# Patient Record
Sex: Female | Born: 1955 | Race: White | Hispanic: No | Marital: Married | Smoking: Never smoker
Health system: Southern US, Community
[De-identification: ages and names within clinical notes are randomized; demographics above are authoritative.]

## PROBLEM LIST (undated history)

## (undated) DIAGNOSIS — I359 Nonrheumatic aortic valve disorder, unspecified: Secondary | ICD-10-CM

## (undated) DIAGNOSIS — E039 Hypothyroidism, unspecified: Secondary | ICD-10-CM

## (undated) DIAGNOSIS — G43909 Migraine, unspecified, not intractable, without status migrainosus: Secondary | ICD-10-CM

## (undated) DIAGNOSIS — E559 Vitamin D deficiency, unspecified: Secondary | ICD-10-CM

## (undated) DIAGNOSIS — Z85820 Personal history of malignant melanoma of skin: Secondary | ICD-10-CM

## (undated) DIAGNOSIS — M1990 Unspecified osteoarthritis, unspecified site: Secondary | ICD-10-CM

## (undated) DIAGNOSIS — Z9289 Personal history of other medical treatment: Secondary | ICD-10-CM

## (undated) DIAGNOSIS — Z932 Ileostomy status: Secondary | ICD-10-CM

## (undated) DIAGNOSIS — N840 Polyp of corpus uteri: Secondary | ICD-10-CM

## (undated) DIAGNOSIS — I351 Nonrheumatic aortic (valve) insufficiency: Secondary | ICD-10-CM

## (undated) DIAGNOSIS — K5010 Crohn's disease of large intestine without complications: Secondary | ICD-10-CM

## (undated) HISTORY — PX: LIPOMA EXCISION: SHX5283

## (undated) HISTORY — DX: Nonrheumatic aortic valve disorder, unspecified: I35.9

## (undated) HISTORY — PX: ABDOMINAL EXPLORATION SURGERY: SHX538

## (undated) HISTORY — PX: TRANSTHORACIC ECHOCARDIOGRAM: SHX275

## (undated) HISTORY — PX: TUBAL LIGATION: SHX77

## (undated) HISTORY — PX: BUNIONECTOMY: SHX129

---

## 1996-04-20 HISTORY — PX: BREAST BIOPSY: SHX20

## 1998-01-17 ENCOUNTER — Other Ambulatory Visit: Admission: RE | Admit: 1998-01-17 | Discharge: 1998-01-17 | Payer: Self-pay | Admitting: Obstetrics and Gynecology

## 1998-02-12 ENCOUNTER — Other Ambulatory Visit: Admission: RE | Admit: 1998-02-12 | Discharge: 1998-02-12 | Payer: Self-pay | Admitting: Obstetrics and Gynecology

## 1999-04-03 ENCOUNTER — Other Ambulatory Visit: Admission: RE | Admit: 1999-04-03 | Discharge: 1999-04-03 | Payer: Self-pay | Admitting: Obstetrics and Gynecology

## 1999-05-13 ENCOUNTER — Encounter: Payer: Self-pay | Admitting: Emergency Medicine

## 1999-05-13 ENCOUNTER — Inpatient Hospital Stay (HOSPITAL_COMMUNITY): Admission: EM | Admit: 1999-05-13 | Discharge: 1999-05-15 | Payer: Self-pay | Admitting: Emergency Medicine

## 1999-05-14 ENCOUNTER — Encounter: Payer: Self-pay | Admitting: Emergency Medicine

## 1999-05-15 ENCOUNTER — Encounter: Payer: Self-pay | Admitting: General Surgery

## 1999-06-03 ENCOUNTER — Encounter: Payer: Self-pay | Admitting: Obstetrics and Gynecology

## 1999-06-03 ENCOUNTER — Encounter: Admission: RE | Admit: 1999-06-03 | Discharge: 1999-06-03 | Payer: Self-pay | Admitting: Obstetrics and Gynecology

## 1999-07-03 ENCOUNTER — Ambulatory Visit (HOSPITAL_COMMUNITY): Admission: RE | Admit: 1999-07-03 | Discharge: 1999-07-03 | Payer: Self-pay | Admitting: Gastroenterology

## 1999-07-03 ENCOUNTER — Encounter (INDEPENDENT_AMBULATORY_CARE_PROVIDER_SITE_OTHER): Payer: Self-pay | Admitting: Specialist

## 1999-07-10 ENCOUNTER — Encounter: Payer: Self-pay | Admitting: Gastroenterology

## 1999-07-10 ENCOUNTER — Encounter: Admission: RE | Admit: 1999-07-10 | Discharge: 1999-07-10 | Payer: Self-pay | Admitting: Gastroenterology

## 2000-06-04 ENCOUNTER — Encounter: Payer: Self-pay | Admitting: Obstetrics and Gynecology

## 2000-06-04 ENCOUNTER — Encounter: Admission: RE | Admit: 2000-06-04 | Discharge: 2000-06-04 | Payer: Self-pay | Admitting: Obstetrics and Gynecology

## 2000-10-25 ENCOUNTER — Ambulatory Visit (HOSPITAL_COMMUNITY): Admission: RE | Admit: 2000-10-25 | Discharge: 2000-10-25 | Payer: Self-pay | Admitting: Specialist

## 2000-11-10 ENCOUNTER — Ambulatory Visit (HOSPITAL_COMMUNITY): Admission: RE | Admit: 2000-11-10 | Discharge: 2000-11-10 | Payer: Self-pay | Admitting: Specialist

## 2001-04-01 ENCOUNTER — Encounter: Admission: RE | Admit: 2001-04-01 | Discharge: 2001-04-01 | Payer: Self-pay | Admitting: Internal Medicine

## 2001-04-01 ENCOUNTER — Encounter: Payer: Self-pay | Admitting: Internal Medicine

## 2001-05-26 ENCOUNTER — Encounter (INDEPENDENT_AMBULATORY_CARE_PROVIDER_SITE_OTHER): Payer: Self-pay | Admitting: Specialist

## 2001-05-26 ENCOUNTER — Ambulatory Visit (HOSPITAL_COMMUNITY): Admission: RE | Admit: 2001-05-26 | Discharge: 2001-05-26 | Payer: Self-pay | Admitting: Gastroenterology

## 2001-06-09 ENCOUNTER — Encounter: Payer: Self-pay | Admitting: Obstetrics and Gynecology

## 2001-06-09 ENCOUNTER — Encounter: Admission: RE | Admit: 2001-06-09 | Discharge: 2001-06-09 | Payer: Self-pay | Admitting: Obstetrics and Gynecology

## 2001-06-14 ENCOUNTER — Encounter: Admission: RE | Admit: 2001-06-14 | Discharge: 2001-06-14 | Payer: Self-pay | Admitting: Obstetrics and Gynecology

## 2001-06-14 ENCOUNTER — Encounter: Payer: Self-pay | Admitting: Obstetrics and Gynecology

## 2001-08-25 ENCOUNTER — Other Ambulatory Visit: Admission: RE | Admit: 2001-08-25 | Discharge: 2001-08-25 | Payer: Self-pay | Admitting: Obstetrics and Gynecology

## 2002-04-21 ENCOUNTER — Inpatient Hospital Stay (HOSPITAL_COMMUNITY): Admission: EM | Admit: 2002-04-21 | Discharge: 2002-04-24 | Payer: Self-pay | Admitting: Emergency Medicine

## 2002-04-21 ENCOUNTER — Encounter: Payer: Self-pay | Admitting: Emergency Medicine

## 2002-04-24 ENCOUNTER — Encounter (INDEPENDENT_AMBULATORY_CARE_PROVIDER_SITE_OTHER): Payer: Self-pay | Admitting: *Deleted

## 2002-05-01 ENCOUNTER — Ambulatory Visit (HOSPITAL_COMMUNITY): Admission: RE | Admit: 2002-05-01 | Discharge: 2002-05-01 | Payer: Self-pay | Admitting: General Surgery

## 2002-05-01 ENCOUNTER — Encounter: Payer: Self-pay | Admitting: General Surgery

## 2002-06-15 ENCOUNTER — Encounter: Payer: Self-pay | Admitting: Obstetrics and Gynecology

## 2002-06-15 ENCOUNTER — Encounter: Admission: RE | Admit: 2002-06-15 | Discharge: 2002-06-15 | Payer: Self-pay | Admitting: Obstetrics and Gynecology

## 2002-09-25 ENCOUNTER — Other Ambulatory Visit: Admission: RE | Admit: 2002-09-25 | Discharge: 2002-09-25 | Payer: Self-pay | Admitting: Obstetrics and Gynecology

## 2002-10-02 ENCOUNTER — Encounter: Payer: Self-pay | Admitting: Obstetrics and Gynecology

## 2002-10-02 ENCOUNTER — Encounter: Admission: RE | Admit: 2002-10-02 | Discharge: 2002-10-02 | Payer: Self-pay | Admitting: Obstetrics and Gynecology

## 2002-11-10 ENCOUNTER — Ambulatory Visit (HOSPITAL_COMMUNITY): Admission: RE | Admit: 2002-11-10 | Discharge: 2002-11-10 | Payer: Self-pay | Admitting: Obstetrics and Gynecology

## 2002-11-10 ENCOUNTER — Encounter (INDEPENDENT_AMBULATORY_CARE_PROVIDER_SITE_OTHER): Payer: Self-pay | Admitting: *Deleted

## 2002-11-20 ENCOUNTER — Encounter (HOSPITAL_COMMUNITY): Admission: RE | Admit: 2002-11-20 | Discharge: 2003-02-18 | Payer: Self-pay | Admitting: General Surgery

## 2002-11-20 ENCOUNTER — Encounter: Payer: Self-pay | Admitting: General Surgery

## 2003-04-21 HISTORY — PX: MELANOMA EXCISION: SHX5266

## 2003-05-28 ENCOUNTER — Inpatient Hospital Stay (HOSPITAL_COMMUNITY): Admission: EM | Admit: 2003-05-28 | Discharge: 2003-05-29 | Payer: Self-pay | Admitting: Emergency Medicine

## 2003-06-29 ENCOUNTER — Encounter: Admission: RE | Admit: 2003-06-29 | Discharge: 2003-06-29 | Payer: Self-pay | Admitting: Obstetrics and Gynecology

## 2003-10-30 ENCOUNTER — Other Ambulatory Visit: Admission: RE | Admit: 2003-10-30 | Discharge: 2003-10-30 | Payer: Self-pay | Admitting: Obstetrics and Gynecology

## 2004-07-15 ENCOUNTER — Encounter: Admission: RE | Admit: 2004-07-15 | Discharge: 2004-07-15 | Payer: Self-pay | Admitting: Obstetrics and Gynecology

## 2004-11-28 ENCOUNTER — Other Ambulatory Visit: Admission: RE | Admit: 2004-11-28 | Discharge: 2004-11-28 | Payer: Self-pay | Admitting: Obstetrics and Gynecology

## 2005-07-28 ENCOUNTER — Encounter: Admission: RE | Admit: 2005-07-28 | Discharge: 2005-07-28 | Payer: Self-pay | Admitting: Obstetrics and Gynecology

## 2005-08-19 ENCOUNTER — Encounter: Admission: RE | Admit: 2005-08-19 | Discharge: 2005-08-19 | Payer: Self-pay | Admitting: Obstetrics and Gynecology

## 2006-08-02 ENCOUNTER — Encounter: Admission: RE | Admit: 2006-08-02 | Discharge: 2006-08-02 | Payer: Self-pay | Admitting: Obstetrics and Gynecology

## 2006-08-10 ENCOUNTER — Encounter: Admission: RE | Admit: 2006-08-10 | Discharge: 2006-08-10 | Payer: Self-pay | Admitting: Obstetrics and Gynecology

## 2006-09-15 ENCOUNTER — Inpatient Hospital Stay (HOSPITAL_COMMUNITY): Admission: EM | Admit: 2006-09-15 | Discharge: 2006-09-19 | Payer: Self-pay | Admitting: Emergency Medicine

## 2007-08-15 ENCOUNTER — Encounter: Admission: RE | Admit: 2007-08-15 | Discharge: 2007-08-15 | Payer: Self-pay | Admitting: Obstetrics and Gynecology

## 2008-11-01 ENCOUNTER — Encounter: Admission: RE | Admit: 2008-11-01 | Discharge: 2008-11-01 | Payer: Self-pay | Admitting: Obstetrics and Gynecology

## 2008-11-14 ENCOUNTER — Encounter: Admission: RE | Admit: 2008-11-14 | Discharge: 2008-11-14 | Payer: Self-pay | Admitting: Obstetrics and Gynecology

## 2009-10-23 ENCOUNTER — Ambulatory Visit (HOSPITAL_COMMUNITY): Admission: RE | Admit: 2009-10-23 | Discharge: 2009-10-23 | Payer: Self-pay | Admitting: Gynecology

## 2009-12-30 ENCOUNTER — Encounter: Admission: RE | Admit: 2009-12-30 | Discharge: 2009-12-30 | Payer: Self-pay | Admitting: Obstetrics and Gynecology

## 2010-01-01 ENCOUNTER — Encounter
Admission: RE | Admit: 2010-01-01 | Discharge: 2010-01-01 | Payer: Self-pay | Source: Home / Self Care | Admitting: Obstetrics and Gynecology

## 2010-02-20 ENCOUNTER — Ambulatory Visit: Payer: Self-pay | Admitting: Vascular Surgery

## 2010-02-27 ENCOUNTER — Ambulatory Visit: Payer: Self-pay | Admitting: Vascular Surgery

## 2010-04-23 LAB — CBC
HCT: 39.2 % (ref 36.0–46.0)
Hemoglobin: 13 g/dL (ref 12.0–15.0)
MCH: 29.1 pg (ref 26.0–34.0)
MCHC: 33.2 g/dL (ref 30.0–36.0)
MCV: 87.9 fL (ref 78.0–100.0)
Platelets: 222 10*3/uL (ref 150–400)
RBC: 4.46 MIL/uL (ref 3.87–5.11)
RDW: 13.8 % (ref 11.5–15.5)
WBC: 8.3 10*3/uL (ref 4.0–10.5)

## 2010-04-23 LAB — BASIC METABOLIC PANEL
BUN: 21 mg/dL (ref 6–23)
CO2: 27 mEq/L (ref 19–32)
Calcium: 9.6 mg/dL (ref 8.4–10.5)
Chloride: 104 mEq/L (ref 96–112)
Creatinine, Ser: 0.82 mg/dL (ref 0.4–1.2)
GFR calc Af Amer: >60 mL/min (ref >60)
GFR calc non Af Amer: >60 mL/min (ref >60)
Glucose, Bld: 68 mg/dL — ABNORMAL LOW (ref 70–99)
Potassium: 3.5 mEq/L (ref 3.5–5.1)
Sodium: 138 mEq/L (ref 135–145)

## 2010-04-24 ENCOUNTER — Ambulatory Visit (HOSPITAL_COMMUNITY)
Admission: RE | Admit: 2010-04-24 | Discharge: 2010-04-24 | Payer: Self-pay | Source: Home / Self Care | Attending: Surgery | Admitting: Surgery

## 2010-05-11 ENCOUNTER — Encounter: Payer: Self-pay | Admitting: Orthopedic Surgery

## 2010-05-12 ENCOUNTER — Encounter: Payer: Self-pay | Admitting: Obstetrics and Gynecology

## 2010-05-15 LAB — SURGICAL PCR SCREEN: MRSA, PCR: NEGATIVE

## 2010-07-06 LAB — PROTIME-INR: Prothrombin Time: 12.2 seconds (ref 11.6–15.2)

## 2010-07-06 LAB — CBC
MCH: 30.7 pg (ref 26.0–34.0)
MCHC: 34.2 g/dL (ref 30.0–36.0)
Platelets: 207 10*3/uL (ref 150–400)
RBC: 4.07 MIL/uL (ref 3.87–5.11)

## 2010-09-02 NOTE — Consult Note (Signed)
NAME:  Brittany Whitney, Brittany Whitney NO.:  [id number]   MEDICAL RECORD NO.:  [id number]          PATIENT TYPE:  INP   LOCATION:  8127                         FACILITY:  [facility]   PHYSICIAN:  Imogene Burn. Georgette Dover, M.D. DATE OF BIRTH:  1955-07-11   DATE OF CONSULTATION:  09/15/2006  DATE OF DISCHARGE:                                 CONSULTATION   REFERRING PHYSICIAN:  Lear Ng, MD   REASON FOR CONSULTATION:  Questionable stricture in ileostomy, possibly  requiring surgical revision.   HISTORY OF PRESENT ILLNESS:  Brittany Whitney is a 21-year female patient who  back in the 17s was diagnosed with ulcerative colitis.  She has had  prior total colectomy and ileostomy in 1988 at [facility].  She has had  multiple revisions.  She has had prior right lower quadrant ileostomy  which developed a fistula and had to be revised into a Brooke ileostomy  in the left lower quadrant in 1993 by Dr. Dalbert Batman.  Subsequently, it was  determined the patient did not have ulcerative colitis, but she had  Crohn disease.  She has not been on any medications for this for years  since she has been in remission and has been asymptomatic.  She had  recently been seen at [facility] by a gastroenterologist and surgeon  for consultation, but has not have any procedures done by this surgeon.  She has also had prior surgeries done in [place].   In regards to this admission, the patient relates abrupt onset Wednesday  of initially no output through her ileostomy for about 6-8 hours.  Subsequently, she developed bloating, nausea and vomiting and  significant pain.  She presented to the ER and apparently opened up;  symptoms seemed to resolve, but as a precaution, a CT with contrast was  ordered and after drinking oral contrast, the patient's symptoms  returned.  Now she is having intermittent diffuse abdominal pain,  cramping with increasing loud bowel sounds and a sensation when symptoms  clear that she has  emptied into her bag.  A CT scan, as noted, was done  and this demonstrated a distal small bowel proximal to the ostomy with  increased wall thickening as well as other small bowel wall thickening  at the terminal ileum; this extends to the ileostomy pouch.  They are  questioning a mild stricture.  Because of these findings and the  patient's symptoms, surgical consultation has been requested.   REVIEW OF SYSTEMS:  As above.  Essentially no fevers, no chills, no  cough, no chest pain, no tachypalpitations.  She has noted some bulging  in the right lower quadrant scar area of prior ileostomy and this has  become more tender over the past 6 months.   PAST MEDICAL HISTORY:  1. Crohn disease previously thought to be ulcerative colitis.  2. Recent MRSA abscess on buttocks.   PAST SURGICAL HISTORY:  1. Total colectomy with continent ileostomy in 1988 at [facility] with at      least 7 or 8 revision operations since then.  2. 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  3. C-sections.  4. Total hysterectomy.  5. A right breast biopsy for lipoma.  6. 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   ALLERGIES:  NO KNOWN DRUG ALLERGIES.   CURRENT MEDICATIONS IN THE HOSPITAL:  1. Ceftin.  2. Prednisone.  3. Various p.r.n. medications.   The patient and was on prednisone 5 mg daily, calcium and Ambien as  needed.   SOCIAL HISTORY:  No alcohol.  No tobacco.  She is married.   PHYSICAL EXAM:  GENERAL:  Pleasant female patient, currently complaining  of intermittent abdominal pain which can be quite severe in nature with  associated bloating.  VITAL SIGNS:  Temperature 98.4, BP 114/40, pulse 68, respirations 15.  NEUROLOGIC:  The patient is alert and oriented x3, moving all  extremities x4.  No focal deficits.  HEENT:  Head normocephalic.  Sclerae not injected.  NECK:  Supple.   No adenopathy.  CHEST:  Bilateral lung sounds are clear to auscultation.  Respiratory  effort is nonlabored.  CARDIAC:  S1 and S2; no rubs, murmurs, thrills or gallops.  IV fluids  are infusing.  ABDOMEN:  Soft and nondistended.  She is diffusely tender without  guarding or rebounding, more so over the right lower quadrant.  She also  has an old scar in this area where she had prior ileostomy revision.  There is slight wall defect with no hernia felt.  She has an ileostomy  that is pink with noted flatus and stool in the bag.  EXTREMITIES:  Symmetrical in appearance without edema, cyanosis or  clubbing.   LABORATORY DATA:  Potassium 3.1, sodium 141, BUN 9, creatinine 0.63.  White count today is 5100; 24 hours prior at admission, it was 11,100,  hemoglobin 9.9.   DIAGNOSTICS:  CT of the abdomen and pelvis as noted.   IMPRESSION:  1. Abdominal pain with CT findings concerning for recurrence of      Crohn's disease and strictures near the ileostomy site.  2. Right lower quadrant pain over old abdominal incisional, rule out      hernia.  3. Hypokalemia.  4. History of multiple surgical revision and resections secondary to      Crohn's disease.   PLAN:  1. We will go ahead and check a fistulogram today to rule out any      definite stricture that may be surgical in nature.  2. CT findings appear to be more consistent with acute Crohn's      exacerbation, defer that treatment to GI.  3. We will also review the actual CT films to determine if there is a      hernia at the right lower quadrant incision sites.      Walters Delphia Grates. Tsuei, M.D.  Electronically Signed    ALE/MEDQ  D:  09/17/2006  T:  09/17/2006  Job:  [id number]

## 2010-09-02 NOTE — H&P (Signed)
NAME:  Brittany Whitney, Brittany Whitney NO.:  [id number]   MEDICAL RECORD NO.:  [id number]          PATIENT TYPE:  INP   LOCATION:  1846                         FACILITY:  [facility]   PHYSICIAN:  Lear Ng, MDDATE OF BIRTH:  1956/04/18   DATE OF ADMISSION:  09/15/2006  DATE OF DISCHARGE:                              HISTORY & PHYSICAL   This is a 55 year old female with history of inflammatory bowel disease  thought in the past to be ulcerative colitis with a proctocolectomy with  ileostomy and has had multiple revisions, who presents to the ER today  in severe abdominal pain, nausea and vomiting.  She states that she has  had no ileostomy output since 9:00 p.m. on May 27.  She is negative for  any recent illnesses with the exception of recent oral surgery.  She is  currently on Ceftin.  She states that she had a small skin lesion on her  buttocks that was diagnosed as MRSA approximately two months ago, it was  treated by Dr. Wilhemina Bonito.  This is the fifth or sixth time she has  become obstructed.   PRIMARY GI DOCTOR:  Robert Buccini.   SURGEON:  Fanny Skates   PAST MEDICAL HISTORY:  1. Recent MRSA on buttocks.  2. Colectomy.  3. Recurrent SBO.  4. Ovarian cyst.  5. History of breast lump.  6. Dr. Herbie Baltimore Buccini performed an ileoscopy in 2004, results showed      inflamed ileum and stenosis approximately 15 cm from her stoma.   CURRENT MEDICATIONS:  Include:  1. Prednisone.  2. Calcium.  3. Ceftin.  4. Ambien.  5. Hydrocodone.  6. Acetaminophen.   ALLERGIES:  PENICILLIN AND CECLOR (RASH).   REVIEW OF SYSTEMS:  Significant for recent oral surgery on May 23.  Patient is currently on Ceftin.   FAMILY HISTORY:  Significant for heart disease and breast cancer.   SOCIAL HISTORY:  Occasional alcohol, negative for tobacco and drugs.  She lives with her husband.  She has two children.   PHYSICAL EXAMINATION:  GENERAL:  She is alert and oriented and resting  comfortably.  She is afraid to move because she is afraid that her pain  will begin again.  CARDIOVASCULAR SYSTEM:  Regular rate and rhythm.  LUNGS:  Clear.  ABDOMEN:  Soft, nondistended.  Her ostomy is empty.  She has positive  bowel sounds.   CURRENT LABS:  Hemoglobin 11.6, hematocrit 34.7, white count 11.1,  platelets 287,000.  Chem 7 shows a sodium of 138, potassium of 3.4,  chloride 105, bicarb 24, BUN 24, creatinine 0.63 and glucose 93.  UA was  negative.   On CT with Gastrografin contrast of the abdomen and pelvis:  1. Moderate amount of free fluid in the pelvis.  2. Thickening from [facility] pouch to stoma.  3. No obstruction.   ASSESSMENT:  Dr. Wilford Corner has seen and examined the patient and  collected a history.  He states this is a recurrent obstruction.  Patient has seen Dr. Kristie Cowman in gastroenterology at [facility] and  had surgical consultation  there several years ago.  She states that she  wants to leave her ostomy in the same spot, but pull her bowel through.  We will consult [facility], as she is a patient of Dr.  Fanny Skates, and admit the patient for observation and rehydration.  While we were examining the patient, she began to vomit.  As she was  vomiting, she said she felt something in her ostomy release and she had  a great deal of output into her ostomy bag so the patient felt great  relief.  She received Gastrografin contrast orally for a CT scan and  again had symptoms of obstruction, severe pain, started vomiting for two  hours until again she felt something release into her ostomy and she had  ostomy output.      Melton Alar, [place]      Lear Ng, MD  Electronically Signed    MLY/MEDQ  D:  09/15/2006  T:  09/15/2006  Job:  [id number]   cc:   Ronald Lobo, M.D.  Edsel Petrin. Dalbert Batman, M.D.

## 2010-09-05 NOTE — Discharge Summary (Signed)
NAME:  Brittany Whitney, Brittany Whitney NO.:  [id number]   MEDICAL RECORD NO.:  [id number]                   PATIENT TYPE:  INP   LOCATION:  5503                                 FACILITY:  [facility]   PHYSICIAN:  Edsel Petrin. Dalbert Batman, M.D.             DATE OF BIRTH:  Jun 08, 1955   DATE OF ADMISSION:  04/21/2002  DATE OF DISCHARGE:  04/24/2002                                 DISCHARGE SUMMARY   FINAL DIAGNOSES:  1. Abdominal pain and vomiting of uncertain etiology.  2. Inflammation and noncritical stenosis of distal ileum proximal to     ileostomy of uncertain significance.  3. Status post total colectomy with ileostomy for ulcerative colitis.   PROCEDURE PERFORMED:  Ileoscopy 04/24/02.   HISTORY:  The patient is a 55 year old white female with a significant past  medical history of ulcerative colitis leading to total colectomy and  ileostomy and multiple revisions in the past.  She had a history of small-  bowel obstruction in the past which has resolved spontaneously.  She also  has a history of ileitis documented by enteroscopy through the ileostomy  stoma.  She takes low-dose prednisone for this.   She presented to the emergency room with an 8- to 10-hour history of  moderately severe centralized lower abdominal cramping and decreased but not  absent ileostomy output.  She had one episode of vomiting after supper the  day of admission.  She vomited food only; no bilious vomiting.   For details of her past medical history, social history, family history,  please see detailed admission note.   PHYSICAL EXAMINATION:  GENERAL:  Pleasant healthy-appearing middle-aged  woman.  She was complaining of intermittent abdominal cramps.  She is very  stoic.  VITAL SIGNS:  Temperature 98.1, blood pressure 99/60, heart rate 103,  respirations 24.  EYES:  Sclerae are clear.  OROPHARYNX:  Clear.  NECK:  Supple, nontender; no thyromegaly, no bruit, no adenopathy.  HEART:  Regular  rate and rhythm; no murmurs.  LUNGS:  Clear to auscultation.  ABDOMEN:  Soft.  Hypoactive bowel sounds.  Tenderness with some guarding in  the right lower quadrant.  Well-healed scars.  No evidence of any ventral or  incisional hernias.  Well-healed ileostomy in the left lower quadrant; no  hernia around the ostomy and not very tender around the ostomy.  There is  some stool in the bag.   ADMISSION DATA:  Abdominal x-rays show a fairly gasless abdomen, it  certainly did not look clearly obstructed.  Hemoglobin 10.5, white blood  cell count 9800, complete metabolic panel was normal.  Urinalysis was  normal.   HOSPITAL COURSE:  The patient vomited in the emergency room and a  nasogastric tube was placed.  She felt better a few hours after that, stated  that the nausea and cramps were resolving.   Consultation was obtained with Dr. Herbie Baltimore Buccini's group.  Dr. Oletta Lamas saw  her and thought that this could possibly be an early partial small-bowel  obstruction but that she was improving and he agreed with the management  plan.  The patient felt better and began putting out fluid through her  ileostomy on 04/22/02 and her nasogastric tube was discontinued and she was  given ice chips and then subsequently clear liquids and did well with those.   On 04/24/02 she was doing much better, tolerating full liquid diet, had good  output from her ileostomy and her abdominal exam was fairly unremarkable.  She was concerned about whether she was going to have future episodes of  this and I was unclear about that.  She was placed on low-residue diet.  We  asked Dr. Cristina Gong to see her.  He did see her and performed ileoscopy on  04/24/02.  He states that there was some inflammatory changes of the last  few inches of the ileum just proximal to the ileostomy and there was a  stricture about 10-12 cm up inside of uncertain significance.  Since she was  asymptomatic nothing further was done.   Biopsies of  the distal ileum showed ulceration and granulation tissue  formation, this was consistent with Crohn's disease, but no granulomas were  identified.   The patient felt well and wanted to go home and she was discharged home  after the ileoscopy.  She was to follow up with Dr. Cristina Gong in the office in  2-4 weeks.                                               Edsel Petrin. Dalbert Batman, M.D.    HMI/MEDQ  D:  05/26/2002  T:  05/26/2002  Job:  [id number]   cc:   Nehemiah Settle, M.D.  [address]  [place] [address]  Fax: [phone]   Ronald Lobo, M.D.  [address]  [place], [place] [address]  Fax: [phone]

## 2010-09-05 NOTE — Discharge Summary (Signed)
NAME:  Brittany Whitney, Brittany Whitney NO.:  [id number]   MEDICAL RECORD NO.:  [id number]          PATIENT TYPE:  INP   LOCATION:  3009                         FACILITY:  [facility]   PHYSICIAN:  Lear Ng, MDDATE OF BIRTH:  [birth year]   DATE OF ADMISSION:  09/15/2006  DATE OF DISCHARGE:  09/19/2006                               DISCHARGE SUMMARY   DISCHARGE DIAGNOSES:  1. Ileal stricture.  2. History of inflammatory bowel disease.  3. Partial obstruction secondary to #1.   DISCHARGE MEDICATIONS:  1. Entocort 9 mg p.o. daily.  2. Calcium.  3. Ambien.  4. Hydrocodone as needed.  5. Tylenol as needed.   HISTORY OF PRESENT ILLNESS/HOSPITAL COURSE:  Ms. Dietzman presented on Sep 15, 2006, due to severe abdominal pain, nausea and vomiting with  decreased to no ileostomy output.  She is status post proctocolectomy  and ileostomy and multiple revisions in the past with previous diagnosis  of what was thought to be ulcerative colitis.  She has had a history of  a ileal stricture was stenosis dating back to the last several years and  her current presentation was concerning for recurrence of that.  She had  abdominal pelvic CT scan done in the ER which showed mild distension of  ileal loops, but no obstruction or focal abscess.  Due to intermittent  waves of severe abdominal pain as well as decreased ileostomy output,  she was admitted for supportive with fluids and bowel rest.  A sinus  fistula tract study was done which showed a long, smooth-strictured  appearing terminal ileum and functional partial obstruction.  Surgical  consult was obtained during her hospitalization and they recommended  supportive care and there was no indication for ileostomy revision at  this time.  He was placed on a short course of prednisone and responded  to that and was able to tolerate a regular diet prior to discharge.  Prednisone was discontinued prior to discharge and she was switched to  budesonide 9 mg p.o. daily.  She had previously been evaluated at [facility] regarding possible revision of her ileostomy versus further  surgery due to recurrent ileal stenosis, but she had decided to hold off  on any further surgery several years ago.   FOLLOWUP:  The patient was set up to follow up with Dr. Cristina Gong at [facility] within a week of discharge.   CONDITION ON DISCHARGE:  Improved.   DIET:  Low-residue diet.   ACTIVITY:  No restrictions.      Lear Ng, MD  Electronically Signed     VCS/MEDQ  D:  10/14/2006  T:  10/15/2006  Job:  [id number]   cc:   Ronald Lobo, M.D.  Imogene Burn. Tsuei, M.D.  Edsel Petrin. Dalbert Batman, M.D.

## 2010-09-05 NOTE — H&P (Signed)
NAME:  Brittany Whitney, Brittany Whitney NO.:  [id number]   MEDICAL RECORD NO.:  [id number]                   PATIENT TYPE:  AMB   LOCATION:  SDC                                  FACILITY:  [facility]   PHYSICIAN:  Darlyn Chamber, M.D.                DATE OF BIRTH:  [birth year]   DATE OF ADMISSION:  11/10/2002  DATE OF DISCHARGE:                                HISTORY & PHYSICAL   HISTORY OF PRESENT ILLNESS:  The patient is a 55 year old gravida 32, para 2,  abortus 1 married white female who presents for hysteroscopy. In terms of  the present admission, the patient has been on hormone replacement including  Progesterone vaginal cream. She was experiencing some abnormal bleeding.  Subsequent saline infusion ultrasound revealed an endometrial polyp, for  which she presents for hysteroscopic evaluation and resection.   ALLERGIES:  PENICILLIN.   MEDICATIONS:  Include Prednisone 5 mg every other day, calcium and a  multivitamin.   PAST MEDICAL HISTORY:  Significant in that the patient has history of  significant ulcerative colitis. Because of this, she underwent a  proctocolectomy. She does have a Kock pouch in place. She has had numerous  revisions of this. Presently, she does not have any significant problems but  remains on Prednisone. She was previously hospitalized for small bowel  obstruction that did resolve. Otherwise, she has had usual childhood  diseases without any significant sequelae.   PAST SURGICAL HISTORY:  Includes numerous hospitalizations and surgeries for  the ulcerative colitis. She has had, from a gynecologic perspective, a  laparoscopy with lysis of adhesion by Dr. Denton Meek at [facility] in 1991.   OBSTETRICAL HISTORY:  She has had 1 spontaneous abortion and 2 cesarean  sections. She has had a previous tubal with her last cesarean section.   FAMILY HISTORY:  Noncontributory.   SOCIAL HISTORY:  No tobacco or alcohol use.   REVIEW OF SYSTEMS:   Noncontributory.   PHYSICAL EXAMINATION:  VITAL SIGNS: Afebrile with stable vital signs.  HEENT: Normocephalic. Pupils are equal, round, and reactive to light and  accommodation. Extraocular movements were intact. Sclera and conjunctiva  clear. Oropharynx clear.  NECK: Without thyromegaly.  BREAST: No discrete masses at the present time. Does have some increased  density in the upper outer quadrant of the left breast that is presently  under evaluation.  LUNGS: Clear.  CARDIAC: Regular rate. No murmurs or gallops.  ABDOMEN: Midline incision intact. Does have a Kock pouch with ostomy device  on the left. There are no palpable abnormalities.  PELVIC: Vaginal mucosa is clear. Cervix unremarkable. Uterus normal size,  shape and contour. Adnexa free of mass or tenderness.    IMPRESSION:  1. Abnormal uterine bleeding with endometrial polyp.  2. History of ulcerative colitis with numerous operative procedures.   PAST FAMILY PSYCHIATRIC HISTORY:  At the present time, will undergo  hysteroscopy  resection of polyp. The risks of surgery have been discussed  including the risk of anesthetics. Risk of infection. Risk of vascular  injury that could require transfusion with the risk of AIDS or hepatitis.  Significant hemorrhage could require hysterectomy. There is the risk of  perforation with injury to adjacent organs that could require laparoscopy or  possible exploratory laparotomy. Risk of deep vein thrombosis and pulmonary  embolus. The patient expresses understanding of the indications and risks.                                               Darlyn Chamber, M.D.    JSM/MEDQ  D:  11/10/2002  T:  11/10/2002  Job:  [id number]

## 2010-09-05 NOTE — Op Note (Signed)
NAME:  Brittany Whitney, Brittany Whitney NO.:  [id number]   MEDICAL RECORD NO.:  [id number]                   PATIENT TYPE:  INP   LOCATION:  5503                                 FACILITY:  [facility]   PHYSICIAN:  Ronald Lobo, M.D.                DATE OF BIRTH:  Jun 08, 1955   DATE OF PROCEDURE:  04/24/2002  DATE OF DISCHARGE:                                 OPERATIVE REPORT   PROCEDURE PERFORMED:  Ileoscopy through stoma, with biopsies.   INDICATIONS FOR PROCEDURE:  A 55 year old female with long standing history  of inflammatory bowel disease thought to be ulcerative colitis, going back  to age 5.  She has had approximately 10 or 15 operations, culminating in a  Brooke ileostomy which is her current anatomy.  She was admitted to the  hospital several days ago with a small bowel obstruction picture (not  radiographically apparent, but responding to conservative therapy and bowel  rest).  Previous ileoscopy, most recently about a year ago, showed some  inflammatory changes in the ileum, nothing impressive.  The purpose of this  procedure is to assess her current mucosal status and look for evidence of  stricturing stenosis or obstruction.   FINDINGS:  Stenosis at approximately 15 cm from the stoma.  Distal ileal  mucosal inflammatory changes.   DESCRIPTION OF PROCEDURE:  The nature, purpose and risks of the procedure  were familiar to the patient.  She wished for this one to be done under  sedation, so Versed 9 mg IV was administered prior to and during the course  of the procedure without arrhythmias or desaturation.  The Olympus video  upper endoscope was used as an enteroscope and was advanced following  digital exploration of her ileostomy stoma, which showed the orifice to be  somewhat tight but would admit approximately one third of my little finger.   The scope was inserted and advanced to about 15 cm, where upon there was  what appeared to be an end-to-side  anastomosis to an ileal pouch.  This area  was somewhat stenotic and had superficial ulceration, exudate and  friability.  The scope was able to be popped through that area without undue  difficulty and was then advanced about an additional 10 cm or so up a normal  appearing ileum. Pull back was then performed.   It appeared that there was superficial exudate and superficial ulceration in  the distal ileum even distal to the stenotic area at 15 cm.  This area was  biopsied.   The patient tolerated this procedure well and there were no apparent  complications.   IMPRESSION:  Inflammatory changes of uncertain significance involving the  distal portion of the ileum as described above.    PLAN:  1. Await pathology.  2. Small bowel series to look for evidence of inflammatory changes which     might lead to obstruction  in the more proximal sections of her small     bowel.                                               Ronald Lobo, M.D.    RB/MEDQ  D:  04/24/2002  T:  04/24/2002  Job:  [id number]   cc:   Edsel Petrin. Dalbert Batman, M.D.  [address], [place]  [place] [address]  Fax: [phone]   Nehemiah Settle, M.D.  [address]  [place] [address]  Fax: [phone]

## 2010-09-05 NOTE — H&P (Signed)
NAME:  Brittany Whitney, Brittany Whitney NO.:  [id number]   MEDICAL RECORD NO.:  [id number]                   PATIENT TYPE:  INP   LOCATION:  5735                                 FACILITY:  [facility]   PHYSICIAN:  Jeryl Columbia, M.D.                 DATE OF BIRTH:  [birth year]   DATE OF ADMISSION:  05/28/2003  DATE OF DISCHARGE:                                HISTORY & PHYSICAL   HISTORY:  The patient is well-known to my partner, Brittany Whitney, with a  history of ulcerative colitis, total colectomy and ileostomy with multiple  revisions, who has had a periodic small bowel obstruction in the past.  She  called me last night in the middle of the night saying that she had not  seeing anything in her ostomy bag for about five hours and had increased  abdominal pain.  We discussed how if it continued, particularly if nausea,  vomiting or fever occurred, she needed to come to the ER.  She began  vomiting and came to the ER where she was found to have a recurrent small  bowel obstruction, and was admitted for further workup and plan.  She had  had a recent workup at [facility] by Dr. 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   PAST MEDICAL HISTORY:  Pertinent for the ulcerative colitis and multiple  surgeries as above.  She has also had a breast biopsy for a lipoma.  She has  an ovarian cyst, followed by Dr. Radene Knee, and also has some chronic  headaches.   CURRENT MEDICATIONS:  1. Prednisone 5 every other day.  2. Ambien.  3. Imitrex quinine.  4. Prometrium.  5. Testosterone cream.  6. Velban.  7. She does use some over-the-counter vitamin supplements.  8. She does not use any other over-the-counter medicines other than on her     extensive list.   SOCIAL HISTORY:  She does drink socially, but does not smoke.   ALLERGIES:  1. PENICILLIN.  2. FLAGYL.   FAMILY HISTORY:  Negative for any sick contacts  or any obvious other GI  problems.   REVIEW OF SYSTEMS:  This came on acutely.  She had not been sick.  No  urinary complaints.  Possibly some raw carrots which may have done it.  No  other significant allergies to foods, travels, etc.   PHYSICAL EXAMINATION:  GENERAL:  In no acute distress, lying comfortably in  the bed.  VITAL SIGNS:  Stable.  Afebrile.  LUNGS:  Clear.  HEART:  Regular rate and rhythm.  ABDOMEN:  Soft.  There is some lower discomfort without rebound.  There is a  rare bowel sound.  EXTREMITIES:  No pedal edema.  Adequate peripheral pulses.   LABORATORY DATA:  X-rays reviewed.  Compatible with a probable small-bowel  obstruction.  See chart for labs.  No obvious significant abnormalities according to the nurse and Dr. Zenia Resides.   ASSESSMENT:  1. Small bowel obstruction.  The patient with ulcerative colitis, ileostomy     with multiple revisions.  2. Ovarian cyst.  3. Migraines.   PLAN:  Consider surgical consultation, possibly call Dr. ___________ in  [place].  For now, ice chips only.  Keep her comfortable with Buprenex  and Phenergan p.r.n. which she seems to tolerate.  Consider NG tube if  needed.  We will give a gentle enema via ostomy just to see if that helps.  Observation.                                                Jeryl Columbia, M.D.    MEM/MEDQ  D:  05/28/2003  T:  05/28/2003  Job:  [id number]   cc:   Brittany Whitney, M.D.  [address] [place]  [place], [place] [address]  Fax: [place]  [place]  [place] [address]  Fax: [phone]   Edsel Petrin. Dalbert Batman, M.D.  [address], [place]  [place] [address]  Fax: [phone]   Nehemiah Settle, M.D.  [address]  [place] [address]  Fax: [phone]   Kristie Cowman

## 2010-09-05 NOTE — Consult Note (Signed)
NAME:  Brittany Whitney, GASSER NO.:  [id number]   MEDICAL RECORD NO.:  [id number]                   PATIENT TYPE:  INP   LOCATION:  5511                                 FACILITY:  [facility]   PHYSICIAN:  James L. Rolla Flatten., M.D.          DATE OF BIRTH:  Jun 08, 1955   DATE OF CONSULTATION:  04/21/2002  DATE OF DISCHARGE:                                   CONSULTATION   REASON FOR CONSULTATION:  Abdominal pain, vomiting.   HISTORY:  A very delightful 55 year old who has known ulcerative colitis.  She has undergone previous total colectomy and ileostomy and actually had  several procedures for this; the first done at [facility] with a Brook ileostomy  with apparently an attempt at a continent ileostomy that failed and she  ended up having to have another one reestablished in the left lower  quadrant.  She had a recent ileoscopy through the stoma with biopsies back  in 2003 which did not reveal any obvious inflammatory bowel disease but just  some mild ileitis.  She has done reasonably well on low-dose prednisone.  She just returned from a skiing holiday out to [place].  When she came back,  she began to feel sick with abdominal pain and cramps and vomiting.  She is  quite careful with a low-residue diet, etc.  She came to the emergency room  with this, had an NG tube placed, and felt much better.  Her films were not  entirely consistent with small bowel obstruction.  She has had a previous  history of small bowel obstructions that have resolved clinically with  conservative measures.   MEDICATIONS ON ADMISSION:  1. Prednisone 5 mg q.o.d.  2. Magnesium.  3. Calcium.  4. Estrogen patch.  5. Quinine for leg cramps.   ALLERGIES:  PENICILLIN.   PAST MEDICAL HISTORY:  Multiple procedures including total colectomy for  ulcerative colitis with two ileostomies, two C-sections, cataract surgery.   FAMILY HISTORY:  Noncontributory.   PHYSICAL EXAMINATION:  VITAL SIGNS:  The  patient is afebrile.  Vital signs  normal.  GENERAL:  Pleasant white female in no acute distress.  HEENT:  Eyes clear, nonicteric.  NG tube is in place.  HEART:  Regular rhythm without murmurs or gallops.  LUNGS:  Clear.  ABDOMEN:  Surgical scars.  An ileostomy in place in the left lower quadrant  with a small amount of liquid.  Bowel sounds were present.  The abdomen was  not particularly distended.   ASSESSMENT:  Probable early partial small bowel obstruction.  It clearly  seems to be better with nasogastric suction.  I do not feel that at this  point she urgently needs an operation.   PLAN:  Would continue on therapy with nasogastric suction.  We will go ahead  and give intravenous Solu-Medrol.  We will follow with you.  James L. Rolla Flatten., M.D.    Jaynie Bream  D:  04/21/2002  T:  04/21/2002  Job:  [id number]   cc:   Ronald Lobo, M.D.  [address] [place]  [place], [place] [address]  Fax: [phone]   Edsel Petrin. Dalbert Batman, M.D.  [address] [place]  [place] [address]  Fax: [phone]

## 2010-09-05 NOTE — Procedures (Signed)
[facility]  Patient:    Brittany Whitney, Brittany Whitney                        MRN: [id number] Proc. Date: 07/03/99 Adm. Date:  [id number] Attending:  Ernie Avena CC:         Alysia Penna. Maxwell Caul, M.D.             Edsel Petrin. Dalbert Batman, M.D.                           Procedure Report  PROCEDURE PERFORMED:  Ileoscopy with biopsies.  ENDOSCOPIST:  Cleotis Nipper, M.D.  INDICATIONS FOR PROCEDURE:  The patient is a 55 year old female with very longstanding inflammatory bowel disease felt to have been ulcerative colitis in the past, and in fact, she underwent a total proctocolectomy with ileostomy, initially a Kock pouch but ultimately a Brook end ileostomy which is what she currently has. Recently she presented with a small bowel obstruction and retrograde barium contrast radiography of the distal small bowel raised the issue of stenosis and  irregularity of mucosa.  FINDINGS:  Ulcerations suggestive of Crohns ileitis.  DESCRIPTION OF PROCEDURE:  The nature, purpose and risks of the procedure had been discussed with the patient, who provided written consent and came as an outpatient, unprepped.  No sedation was needed.  The Olympus small caliber adult video endoscope was used as an enteroscope and was passed through the stoma which had a normal appearance and an unremarkable partial digital exam.  The stoma was fairly tight, so my index finger could not be completely inserted. The scope was advanced to about 15 cm, whereupon there was a branch point with  small blind pouch measuring a few centimeters in length, which had a small hole at the end of it, probably some post surgical puckering of the mucosa or a pseudodiverticulum.  However, if the scope was negotiated past a couple of folds, I was able to slip up into the main portion of the terminal ileum which had a completely normal mucosal appearance.  The main finding on this exam, however, was  the presence of multiple irregular ulcerations suggestive of Crohns disease in the very distal 10 to 15 cm of the ileum, basically distal to the above-mentioned branch point.  These ulcers varied in size from just 5 mm or so to perhaps 3 or 4 cm x 2 or 3 cm.  Some of the ulcers were quite deep.  They were irregular and somewhat serpiginous.  The intervening mucosa was, for the most part, normal in appearance with perhaps just a little it of friability.  Multiple biopsies were obtained from the ulcerated areas.  No polyps or masses ere observed.  The patient tolerated the procedure well and there were no apparent complications.  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  The patient has not recently been on NSAIDs which might be another cause for scattered distal small bowel ulcerations.  PLAN: 1. Await pathology on the biopsies. 2. Obtain small bowel series. 3. Initiate Pentasa 1 gm q.i.d. 4. Discuss with surgeon and primary physician. DD:  07/03/99 TD:  07/03/99 Job: [id number]

## 2010-09-05 NOTE — Procedures (Signed)
[facility]  Patient:    Brittany Whitney, Brittany Whitney Visit Number: [id number] MRN: [id number]          Service Type: END Location: ENDO Attending Physician:  Ernie Avena Dictated by:   Cleotis Nipper, M.D. Proc. Date: 05/26/01 Admit Date:  05/26/2001   CC:         Nehemiah Settle, M.D.                           Procedure Report  PROCEDURE:  Ileoscopy through stoma, with biopsies.  INDICATIONS:  This is a 55 year old who is many years status post a total colectomy for presumed ulcerative colitis, status post finally a Brooke ileostomy in 1993, maintained on low dose prednisone. Ileoscopy about two years ago showed some ulcerations in the terminal ileum raising the question that she may actually have smoldering inflammatory bowel disease. More recently, the patient has had some low grade obstructive symptoms and symptoms of smoldering inflammation such as myalgias and malaise, although these have gotten better following a brief course of Cipro.  FINDINGS:  Numerous superficial erosions and one or two deep ulcerations in the distal neo-terminal ileum.  DESCRIPTION OF PROCEDURE:   The nature, purposed, and risks of the procedure were familiar to the patient from prior examination. She provided written consent. No sedation was administered. The Olympus pediatric video colonoscope was able to be advanced after digital inspection of the stoma showed it be slightly stenotic at the orifice but it allowed most of my little finger to be inserted, and the pediatric colonoscope was readily inserted. It was advanced to about 15 or 20 cm where upon the patient began to experience discomfort so pullback was initiated.  Up the limit of the exam, the majority of the mucosa was normal, but there were some scattered, patchy postage-stamp size areas of white exudate with some associated friability, and more distally, there were actually some fairly deep irregular  ulcerations, again postage-stamp size. Multiple biopsies were obtained. The patient tolerated the procedure well and there were no apparent complications.  IMPRESSION:  Ileitis, possibly due to inflammatory bowel disease, but relatively asymptomatic at this time.  PLAN: Await pathology. Consider institution of a mesalamine preparation. Dictated by:   Cleotis Nipper, M.D. Attending Physician:  Ernie Avena DD:  05/23/01 TD:  05/26/01 Job: [id number]

## 2010-09-05 NOTE — H&P (Signed)
NAME:  Brittany Whitney, Brittany Whitney NO.:  [id number]   MEDICAL RECORD NO.:  [id number]                   PATIENT TYPE:  INP   LOCATION:  1830                                 FACILITY:  [facility]   PHYSICIAN:  Edsel Petrin. Dalbert Batman, M.D.             DATE OF BIRTH:  27-Jul-1955   DATE OF ADMISSION:  04/21/2002  DATE OF DISCHARGE:                                HISTORY & PHYSICAL   CHIEF COMPLAINT:  Abdominal pain and vomiting.   HISTORY OF PRESENT ILLNESS:  This is a 55 year old white female with a  significant past medical history of ulcerative colitis leading to total  colectomy and ileostomy and multiple revisions.  She has a history of small-  bowel obstruction in the past which has resolved spontaneously.  She also  has a history of ileitis documented by enteroscopy through the ileostomy and  she takes low-dose prednisone.   She now presents with an 8- to 10-hour history of moderately severe  centralized lower abdominal cramps and decreased but not absent ileostomy  output.  She had one episode of vomiting after supper; she vomited food  only, no bilious vomiting.  She is admitted for further evaluation and  management.   PAST HISTORY:  1. Total colectomy with continent ileostomy in 1988 at [facility]  She     had seven or eight operations for multiple revisions after that and the     continent ileostomy never worked well.  She subsequently had a diverting     Brooke ileostomy in the left lower quadrant.  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  2. She has had two pregnancies and two deliveries, both by C-section.  3. She has had a right breast biopsy for benign disease.  4. She has had cataract surgery thought to be due to steroid induced.   CURRENT MEDICATIONS:  1. Prednisone 5 mg every other day.  2. Magnesium.  3. Calcium supplements.  4. Estrogen patch.  5. She does take quinine every other day for leg cramps.   DRUG ALLERGIES:  PENICILLIN.   REVIEW OF SYSTEMS:  All systems are reviewed; they are noncontributory  except as described above.   PHYSICAL EXAMINATION:  GENERAL:  Very pleasant, healthy-appearing middle-  aged woman.  She has intermittent abdominal cramps with moderate pain.  She  is stoic.  VITAL SIGNS:  Temperature 98.1, blood pressure 99/60, heart rate 103,  respiratory rate 24.  HEENT:  Sclerae clear.  Extraocular movements intact.  Oropharynx without  lesions.  Somewhat dry mucous membranes.  NECK:  Neck supple, nontender.  No mass.  No jugular venous distention.  HEART:  Regular rate and rhythm.  No murmur.  LUNGS:  Lungs clear to auscultation.  ABDOMEN:  Abdomen is soft with hypoactive bowel sounds, but she  is tender  with some guarding in the right lower quadrant.  Well-healed scars.  I do  not detect any ventral or incisional herniae.  She has a well-budded  ileostomy in the left lower quadrant.  There is no hernia around the ostomy  and she is really not very tender around the ostomy.  EXTREMITIES:  No edema and good pulses.  NEUROLOGIC:  Neurologic grossly within normal limits.   ADMISSION DATA:  Lab work is pending.   Three-way abdominal films shows basically a gasless abdomen.  There is no  obstructive gas pattern.   The chest x-ray is normal.   IMPRESSION:  1. Abdominal pain and vomiting.  Question whether this is an early small-     bowel obstruction that is just not radiographically apparent.  Question     whether she may have ileitis with stricture somewhere proximal to the     ileostomy.  Question whether there is any other inflammatory process.  2. Status post total colectomy with ileostomy for ulcerative colitis.   PLAN:  1. The patient will be admitted for IV fluid rehydration and bowel rest.  2. We will insert an NG tube if her cramps or vomiting persist.  3. We will consider CT scanning if  the right lower quadrant tenderness     persists.  4. GI consult will be obtained with Dr. Herbie Baltimore Buccini's group.  5. We will check lab work now.  6. She will be given steroids IV.   I advised the patient and her husband that this may require further testing,  but there was no plan for surgery at this time.  Since the diagnosis is not  entirely clear, I would not predict the hospital course.                                               Edsel Petrin. Dalbert Batman, M.D.    HMI/MEDQ  D:  04/21/2002  T:  04/21/2002  Job:  [id number]   cc:   Nehemiah Settle, M.D.  [address]  [place] [address]  Fax: [phone]   Ronald Lobo, M.D.  [address] [place]  [place], [place] [address]  Fax: [phone]

## 2010-09-05 NOTE — Op Note (Signed)
   NAME:  Brittany Whitney, Brittany Whitney NO.:  [id number]   MEDICAL RECORD NO.:  [id number]                   PATIENT TYPE:  AMB   LOCATION:  SDC                                  FACILITY:  [facility]   PHYSICIAN:  Darlyn Chamber, M.D.                DATE OF BIRTH:  1955-12-18   DATE OF PROCEDURE:  11/10/2002  DATE OF DISCHARGE:                                 OPERATIVE REPORT   PREOPERATIVE DIAGNOSES:  1. Abnormal uterine bleeding.  2. Endometrial polyp.   POSTOPERATIVE DIAGNOSES:  1. Abnormal uterine bleeding.  2. Endometrial polyp.   OPERATIVE PROCEDURES:  1. Cervical dilation  2. Hysteroscopy.  3. Resection of endometrium.  4. Endometrial curettings.   SURGEON:  Darlyn Chamber, M.D.   ANESTHESIA:  General.   ESTIMATED BLOOD LOSS:  Minimal.   PACKS AND DRAINS:  None.   INTRAOPERATIVE BLOOD REPLACED:  None.   COMPLICATIONS:  None.   INDICATIONS:  Dictated in the history and physical.   DESCRIPTION OF PROCEDURE:  The patient was taken in the OR and placed in the  supine position.  After satisfactory level of general anesthesia obtained,  the patient was placed in the dorsal lithotomy position using the Allen  stirrups.  The perineum and vagina were prepped out with Betadine and draped  in a sterile field.  A speculum was placed in the vaginal vault.  The cervix  was grasped with a single-tooth tenaculum.  The uterus was posterior,  sounded approximately 8 cm.  The cervix was serially dilated to a size 35  Pratt dilator.  The operative hysteroscope was then introduced.  The  intrauterine cavity revealed some slightly thickened endometrium  posteriorly.  This may have been the polyp.  This was resected along with  multiple endometrial biopsies.  Endometrial curettings were  obtained.  At this point in time the speculum and the single-tooth tenaculum  were removed.  The patient was taken out of the dorsal lithotomy position,  once alert and extubated  transferred to the recovery room in good condition.  Sponge, needle, and instrument count was reported as correct by the  circulating nurse.                                                Darlyn Chamber, M.D.    JSM/MEDQ  D:  11/10/2002  T:  11/11/2002  Job:  [id number]

## 2010-10-21 ENCOUNTER — Other Ambulatory Visit: Payer: Self-pay | Admitting: Gastroenterology

## 2010-12-02 ENCOUNTER — Other Ambulatory Visit: Payer: Self-pay | Admitting: Gynecology

## 2010-12-02 DIAGNOSIS — N83209 Unspecified ovarian cyst, unspecified side: Secondary | ICD-10-CM

## 2010-12-04 ENCOUNTER — Ambulatory Visit (HOSPITAL_COMMUNITY): Payer: BC Managed Care – PPO

## 2010-12-04 ENCOUNTER — Other Ambulatory Visit: Payer: Self-pay | Admitting: Gynecology

## 2010-12-04 ENCOUNTER — Ambulatory Visit (HOSPITAL_COMMUNITY)
Admission: RE | Admit: 2010-12-04 | Discharge: 2010-12-04 | Disposition: A | Discharge status: Home / Self Care | Payer: BC Managed Care – PPO | Source: Ambulatory Visit | Attending: Gynecology | Admitting: Gynecology

## 2010-12-04 DIAGNOSIS — N83209 Unspecified ovarian cyst, unspecified side: Secondary | ICD-10-CM

## 2010-12-04 LAB — CBC
MCH: 29.4 pg (ref 26.0–34.0)
MCV: 87.8 fL (ref 78.0–100.0)
Platelets: 238 10*3/uL (ref 150–400)
RDW: 13.9 % (ref 11.5–15.5)

## 2011-02-04 ENCOUNTER — Other Ambulatory Visit: Payer: Self-pay | Admitting: Obstetrics and Gynecology

## 2011-02-04 DIAGNOSIS — Z1231 Encounter for screening mammogram for malignant neoplasm of breast: Secondary | ICD-10-CM

## 2011-02-20 ENCOUNTER — Ambulatory Visit: Payer: BC Managed Care – PPO

## 2011-02-23 ENCOUNTER — Ambulatory Visit
Admission: RE | Admit: 2011-02-23 | Discharge: 2011-02-23 | Disposition: A | Discharge status: Home / Self Care | Payer: BC Managed Care – PPO | Source: Ambulatory Visit | Attending: Obstetrics and Gynecology | Admitting: Obstetrics and Gynecology

## 2011-02-23 DIAGNOSIS — Z1231 Encounter for screening mammogram for malignant neoplasm of breast: Secondary | ICD-10-CM

## 2011-02-27 ENCOUNTER — Other Ambulatory Visit: Payer: Self-pay | Admitting: Obstetrics and Gynecology

## 2011-02-27 DIAGNOSIS — R928 Other abnormal and inconclusive findings on diagnostic imaging of breast: Secondary | ICD-10-CM

## 2011-03-03 ENCOUNTER — Ambulatory Visit
Admission: RE | Admit: 2011-03-03 | Discharge: 2011-03-03 | Disposition: A | Discharge status: Home / Self Care | Payer: BC Managed Care – PPO | Source: Ambulatory Visit | Attending: Obstetrics and Gynecology | Admitting: Obstetrics and Gynecology

## 2011-03-03 ENCOUNTER — Other Ambulatory Visit: Payer: Self-pay | Admitting: Obstetrics and Gynecology

## 2011-03-03 DIAGNOSIS — R928 Other abnormal and inconclusive findings on diagnostic imaging of breast: Secondary | ICD-10-CM

## 2011-04-28 ENCOUNTER — Other Ambulatory Visit: Payer: Self-pay | Admitting: Obstetrics and Gynecology

## 2011-04-28 DIAGNOSIS — N63 Unspecified lump in unspecified breast: Secondary | ICD-10-CM

## 2011-05-05 ENCOUNTER — Ambulatory Visit
Admission: RE | Admit: 2011-05-05 | Discharge: 2011-05-05 | Disposition: A | Discharge status: Home / Self Care | Payer: BC Managed Care – PPO | Source: Ambulatory Visit | Attending: Obstetrics and Gynecology | Admitting: Obstetrics and Gynecology

## 2011-05-05 DIAGNOSIS — N63 Unspecified lump in unspecified breast: Secondary | ICD-10-CM

## 2011-12-08 ENCOUNTER — Other Ambulatory Visit: Payer: Self-pay | Admitting: Gynecology

## 2011-12-08 DIAGNOSIS — IMO0002 Reserved for concepts with insufficient information to code with codable children: Secondary | ICD-10-CM

## 2011-12-09 ENCOUNTER — Encounter (HOSPITAL_COMMUNITY): Payer: Self-pay | Admitting: Pharmacy Technician

## 2011-12-09 ENCOUNTER — Other Ambulatory Visit: Payer: Self-pay | Admitting: Radiology

## 2011-12-10 ENCOUNTER — Encounter (HOSPITAL_COMMUNITY): Payer: Self-pay

## 2011-12-10 ENCOUNTER — Ambulatory Visit (HOSPITAL_COMMUNITY)
Admission: RE | Admit: 2011-12-10 | Discharge: 2011-12-10 | Disposition: A | Discharge status: Home / Self Care | Payer: BC Managed Care – PPO | Source: Ambulatory Visit | Attending: Gynecology | Admitting: Gynecology

## 2011-12-10 DIAGNOSIS — N83209 Unspecified ovarian cyst, unspecified side: Secondary | ICD-10-CM | POA: Insufficient documentation

## 2011-12-10 DIAGNOSIS — N9489 Other specified conditions associated with female genital organs and menstrual cycle: Secondary | ICD-10-CM | POA: Insufficient documentation

## 2011-12-10 DIAGNOSIS — IMO0002 Reserved for concepts with insufficient information to code with codable children: Secondary | ICD-10-CM

## 2011-12-10 HISTORY — DX: Vitamin D deficiency, unspecified: E55.9

## 2011-12-10 LAB — CBC WITH DIFFERENTIAL/PLATELET
Basophils Relative: 1 % (ref 0–1)
Eosinophils Absolute: 0.1 10*3/uL (ref 0.0–0.7)
Eosinophils Relative: 2 % (ref 0–5)
Lymphs Abs: 1.8 10*3/uL (ref 0.7–4.0)
MCH: 30.7 pg (ref 26.0–34.0)
MCHC: 34.6 g/dL (ref 30.0–36.0)
MCV: 88.5 fL (ref 78.0–100.0)
Neutrophils Relative %: 56 % (ref 43–77)
Platelets: 200 10*3/uL (ref 150–400)
RBC: 4.01 MIL/uL (ref 3.87–5.11)

## 2011-12-10 LAB — PROTIME-INR: Prothrombin Time: 12 seconds (ref 11.6–15.2)

## 2011-12-10 MED ORDER — MIDAZOLAM HCL 2 MG/2ML IJ SOLN
INTRAMUSCULAR | Status: AC
  Filled 2011-12-10: qty 6

## 2011-12-10 MED ORDER — SODIUM CHLORIDE 0.9 % IV SOLN
INTRAVENOUS | Status: DC
  Administered 2011-12-10: 500 mL via INTRAVENOUS

## 2011-12-10 MED ORDER — FENTANYL CITRATE 0.05 MG/ML IJ SOLN
INTRAMUSCULAR | Status: AC
  Filled 2011-12-10: qty 6

## 2011-12-10 MED ORDER — MIDAZOLAM HCL 5 MG/5ML IJ SOLN
INTRAMUSCULAR | Status: AC | PRN
  Administered 2011-12-10: 2 mg via INTRAVENOUS
  Administered 2011-12-10 (×2): 1 mg via INTRAVENOUS

## 2011-12-10 MED ORDER — FENTANYL CITRATE 0.05 MG/ML IJ SOLN
INTRAMUSCULAR | Status: AC | PRN
  Administered 2011-12-10: 50 ug via INTRAVENOUS
  Administered 2011-12-10 (×2): 100 ug via INTRAVENOUS

## 2011-12-10 NOTE — H&P (Signed)
Agree with above.  Mrs. 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  I discussed the possibility of sclerotherapy as a more durable treatment option if her cyst continues to recur.  We will proceed with aspiration today as that has been giving her symptom relief for about a year per treatment.   Signed,  Criselda Peaches, MD Vascular & Interventional Radiologist [facility] Radiology

## 2011-12-10 NOTE — H&P (Signed)
Brittany Whitney is an 57 y.o. female.   Chief Complaint: pelvic pressure HPI: Patient with history of recurrent symptomatic left ovarian cyst presents today for CT/US guided aspiration.  PMH: ulcerative colitis, Chron's disease, migraines PSH: ileostomy with revision, breast bx, lipoma removals Social History: non smoker, occ alcohol               FH: positive for breast cancer Allergies:  Allergies  Allergen Reactions  . Flagyl (Metronidazole) Other (See Comments)    Numbness in legs and arms  . Penicillins Hives  . Ceclor (Cefaclor) Rash    Current outpatient prescriptions:Bismuth Subgallate (DEVROM PO), Take 2-3 tablets by mouth daily., Disp: , Rfl: ;  calcium carbonate (OS-CAL) 600 MG TABS, Take 600 mg by mouth 2 (two) times daily with a meal., Disp: , Rfl: ;  Cholecalciferol (VITAMIN D3) 5000 UNITS CAPS, Take 5,000 capsules by mouth daily., Disp: , Rfl: ;  Cyanocobalamin (VITAMIN B-12 IJ), Inject 1 Syringe as directed every 14 (fourteen) days., Disp: , Rfl:  doxycycline (PERIOSTAT) 20 MG tablet, Take 20 mg by mouth 2 (two) times daily., Disp: , Rfl: ;  estradiol (VIVELLE-DOT) 0.0375 MG/24HR, Place 1 patch onto the skin 2 (two) times a week., Disp: , Rfl: ;  loteprednol (LOTEMAX) 0.2 % SUSP, 1 drop 4 (four) times daily. For 1 week, Disp: , Rfl: ;  Multiple Vitamin (MULTIVITAMIN WITH MINERALS) TABS, Take 1 tablet by mouth 2 (two) times daily., Disp: , Rfl:  naratriptan (AMERGE) 2.5 MG tablet, Take 2.5 mg by mouth as needed. Take one (1) tablet at onset of headache; if returns or does not resolve, may repeat after 4 hours; do not exceed five (5) mg in 24 hours., Disp: , Rfl: ;  predniSONE (DELTASONE) 5 MG tablet, Take 5 mg by mouth every other day., Disp: , Rfl:  PRESCRIPTION MEDICATION, Apply 1 application topically daily. Compound cream: testosterone .01% in a 10 ml syringe. Apply 1 ml appilcation to thin skinned area(s), Disp: , Rfl: ;  progesterone (PROMETRIUM) 100 MG capsule, Take 100 mg by  mouth at bedtime., Disp: , Rfl: ;  SUMAtriptan (IMITREX) 50 MG tablet, Take 50 mg by mouth every 2 (two) hours as needed., Disp: , Rfl:  Vitamin D, Ergocalciferol, (DRISDOL) 50000 UNITS CAPS, Take 50,000 Units by mouth., Disp: , Rfl: ;  zolpidem (AMBIEN) 5 MG tablet, Take 5 mg by mouth at bedtime as needed. For sleep, Disp: , Rfl: ;  botulinum toxin Type A (BOTOX) 100 UNITS SOLR, Inject 100 Units into the muscle every 3 (three) months. For month, Disp: , Rfl:  Current facility-administered medications:0.9 %  sodium chloride infusion, , Intravenous, Continuous, Ascencion Dike, PA, Last Rate: 20 mL/hr at 12/10/11 0808, 500 mL at 12/10/11 0808   Results for orders placed during the hospital encounter of 12/10/11 (from the past 48 hour(s))  APTT     Status: Normal   Collection Time   12/10/11  8:00 AM      Component Value Range Comment   aPTT 31  24 - 37 seconds   CBC WITH DIFFERENTIAL     Status: Abnormal   Collection Time   12/10/11  8:00 AM      Component Value Range Comment   WBC 5.7  4.0 - 10.5 K/uL    RBC 4.01  3.87 - 5.11 MIL/uL    Hemoglobin 12.3  12.0 - 15.0 g/dL    HCT 35.5 (*) 36.0 - 46.0 %    MCV 88.5  78.0 -  100.0 fL    MCH 30.7  26.0 - 34.0 pg    MCHC 34.6  30.0 - 36.0 g/dL    RDW 13.3  11.5 - 15.5 %    Platelets 200  150 - 400 K/uL    Neutrophils Relative 56  43 - 77 %    Neutro Abs 3.2  1.7 - 7.7 K/uL    Lymphocytes Relative 31  12 - 46 %    Lymphs Abs 1.8  0.7 - 4.0 K/uL    Monocytes Relative 11  3 - 12 %    Monocytes Absolute 0.6  0.1 - 1.0 K/uL    Eosinophils Relative 2  0 - 5 %    Eosinophils Absolute 0.1  0.0 - 0.7 K/uL    Basophils Relative 1  0 - 1 %    Basophils Absolute 0.0  0.0 - 0.1 K/uL   PROTIME-INR     Status: Normal   Collection Time   12/10/11  8:00 AM      Component Value Range Comment   Prothrombin Time 12.0  11.6 - 15.2 seconds    INR 0.87  0.00 - 1.49    Results for orders placed during the hospital encounter of 12/10/11  APTT      Component  Value Range   aPTT 31  24 - 37 seconds  CBC WITH DIFFERENTIAL      Component Value Range   WBC 5.7  4.0 - 10.5 K/uL   RBC 4.01  3.87 - 5.11 MIL/uL   Hemoglobin 12.3  12.0 - 15.0 g/dL   HCT 35.5 (*) 36.0 - 46.0 %   MCV 88.5  78.0 - 100.0 fL   MCH 30.7  26.0 - 34.0 pg   MCHC 34.6  30.0 - 36.0 g/dL   RDW 13.3  11.5 - 15.5 %   Platelets 200  150 - 400 K/uL   Neutrophils Relative 56  43 - 77 %   Neutro Abs 3.2  1.7 - 7.7 K/uL   Lymphocytes Relative 31  12 - 46 %   Lymphs Abs 1.8  0.7 - 4.0 K/uL   Monocytes Relative 11  3 - 12 %   Monocytes Absolute 0.6  0.1 - 1.0 K/uL   Eosinophils Relative 2  0 - 5 %   Eosinophils Absolute 0.1  0.0 - 0.7 K/uL   Basophils Relative 1  0 - 1 %   Basophils Absolute 0.0  0.0 - 0.1 K/uL  PROTIME-INR      Component Value Range   Prothrombin Time 12.0  11.6 - 15.2 seconds   INR 0.87  0.00 - 1.49    Review of Systems  Constitutional: Negative for fever and chills.  HENT:       Occ HA's  Respiratory: Negative for cough and shortness of breath.   Cardiovascular: Negative for chest pain.  Gastrointestinal: Positive for abdominal pain. Negative for nausea and vomiting.  Musculoskeletal: Negative for back pain.  Endo/Heme/Allergies: Does not bruise/bleed easily.    Blood pressure 109/56, pulse 68, temperature 98.5 F (36.9 C), temperature source Oral, resp. rate 18, height 5' 7"  (1.702 m), weight 125 lb (56.7 kg), SpO2 99.00%. Physical Exam  Constitutional: She is oriented to person, place, and time. She appears well-developed and well-nourished.  Cardiovascular: Normal rate and regular rhythm.   Respiratory: Effort normal and breath sounds normal.  GI: Soft. Bowel sounds are normal.       Intact ileostomy with some mild tenderness/fullness LLQ  Musculoskeletal: Normal range of motion. She exhibits no edema.  Neurological: She is alert and oriented to person, place, and time.     Assessment/Plan Patient with recurrent symptomatic left ovarian cyst;  plan is for CT/US guided ovarian cyst aspiration. Details/risks of above d/w pt with her understanding and consent.  Zakirah Weingart,D KEVIN 12/10/2011, 9:07 AM

## 2011-12-10 NOTE — Procedures (Signed)
Interventional Radiology Procedure Note  Procedure: CT guided aspiration of recurrent left ovarian/peritoneal inclusion cyst.  30 mL clear fluid aspirated Complications: None Recommendations: - Observe 2 hrs - ADAT  Signed,  Criselda Peaches, MD Vascular & Interventional Radiologist [facility]

## 2011-12-11 ENCOUNTER — Ambulatory Visit: Payer: BC Managed Care – PPO | Admitting: Gynecology

## 2012-01-20 ENCOUNTER — Other Ambulatory Visit: Payer: Self-pay | Admitting: Obstetrics and Gynecology

## 2012-01-20 DIAGNOSIS — N63 Unspecified lump in unspecified breast: Secondary | ICD-10-CM

## 2012-01-26 ENCOUNTER — Other Ambulatory Visit: Payer: Self-pay | Admitting: Obstetrics and Gynecology

## 2012-01-26 ENCOUNTER — Ambulatory Visit
Admission: RE | Admit: 2012-01-26 | Discharge: 2012-01-26 | Disposition: A | Discharge status: Home / Self Care | Payer: BC Managed Care – PPO | Source: Ambulatory Visit | Attending: Obstetrics and Gynecology | Admitting: Obstetrics and Gynecology

## 2012-01-26 DIAGNOSIS — Z139 Encounter for screening, unspecified: Secondary | ICD-10-CM

## 2012-01-26 DIAGNOSIS — N63 Unspecified lump in unspecified breast: Secondary | ICD-10-CM

## 2012-03-08 ENCOUNTER — Other Ambulatory Visit: Payer: Self-pay | Admitting: Obstetrics and Gynecology

## 2012-03-08 ENCOUNTER — Ambulatory Visit
Admission: RE | Admit: 2012-03-08 | Discharge: 2012-03-08 | Disposition: A | Discharge status: Home / Self Care | Payer: BC Managed Care – PPO | Source: Ambulatory Visit | Attending: Obstetrics and Gynecology | Admitting: Obstetrics and Gynecology

## 2012-03-08 DIAGNOSIS — Z139 Encounter for screening, unspecified: Secondary | ICD-10-CM

## 2012-04-29 ENCOUNTER — Other Ambulatory Visit: Payer: Self-pay | Admitting: Obstetrics and Gynecology

## 2012-04-29 DIAGNOSIS — N6009 Solitary cyst of unspecified breast: Secondary | ICD-10-CM

## 2012-05-12 ENCOUNTER — Other Ambulatory Visit: Payer: BC Managed Care – PPO

## 2012-05-17 ENCOUNTER — Ambulatory Visit
Admission: RE | Admit: 2012-05-17 | Discharge: 2012-05-17 | Disposition: A | Discharge status: Home / Self Care | Payer: BC Managed Care – PPO | Source: Ambulatory Visit | Attending: Obstetrics and Gynecology | Admitting: Obstetrics and Gynecology

## 2012-05-17 DIAGNOSIS — N6009 Solitary cyst of unspecified breast: Secondary | ICD-10-CM

## 2012-07-05 ENCOUNTER — Other Ambulatory Visit: Payer: Self-pay | Admitting: Dermatology

## 2013-02-10 ENCOUNTER — Other Ambulatory Visit: Payer: Self-pay

## 2013-02-10 DIAGNOSIS — Z1231 Encounter for screening mammogram for malignant neoplasm of breast: Secondary | ICD-10-CM

## 2013-03-14 ENCOUNTER — Ambulatory Visit
Admission: RE | Admit: 2013-03-14 | Discharge: 2013-03-14 | Disposition: A | Discharge status: Home / Self Care | Payer: BC Managed Care – PPO | Source: Ambulatory Visit

## 2013-03-14 DIAGNOSIS — Z1231 Encounter for screening mammogram for malignant neoplasm of breast: Secondary | ICD-10-CM

## 2013-06-29 ENCOUNTER — Other Ambulatory Visit: Payer: Self-pay | Admitting: Obstetrics and Gynecology

## 2013-06-29 DIAGNOSIS — Z803 Family history of malignant neoplasm of breast: Secondary | ICD-10-CM

## 2013-06-29 DIAGNOSIS — R922 Inconclusive mammogram: Secondary | ICD-10-CM

## 2013-06-29 DIAGNOSIS — R9230 Dense breasts, unspecified: Secondary | ICD-10-CM

## 2013-07-03 ENCOUNTER — Other Ambulatory Visit: Payer: Self-pay | Admitting: Obstetrics and Gynecology

## 2013-07-03 DIAGNOSIS — Z803 Family history of malignant neoplasm of breast: Secondary | ICD-10-CM

## 2013-07-03 DIAGNOSIS — R922 Inconclusive mammogram: Secondary | ICD-10-CM

## 2013-07-10 ENCOUNTER — Other Ambulatory Visit: Payer: BC Managed Care – PPO

## 2013-07-18 ENCOUNTER — Ambulatory Visit
Admission: RE | Admit: 2013-07-18 | Discharge: 2013-07-18 | Disposition: A | Discharge status: Home / Self Care | Payer: BC Managed Care – PPO | Source: Ambulatory Visit | Attending: Obstetrics and Gynecology | Admitting: Obstetrics and Gynecology

## 2013-07-18 DIAGNOSIS — R922 Inconclusive mammogram: Secondary | ICD-10-CM

## 2013-07-18 DIAGNOSIS — Z803 Family history of malignant neoplasm of breast: Secondary | ICD-10-CM

## 2013-07-18 MED ORDER — GADOBENATE DIMEGLUMINE 529 MG/ML IV SOLN
10.0000 mL | Freq: Once | INTRAVENOUS | Status: AC | PRN
  Administered 2013-07-18: 10 mL via INTRAVENOUS

## 2014-02-07 ENCOUNTER — Other Ambulatory Visit: Payer: Self-pay | Admitting: Obstetrics and Gynecology

## 2014-02-07 ENCOUNTER — Other Ambulatory Visit: Payer: Self-pay | Admitting: Dermatology

## 2014-02-07 DIAGNOSIS — N6321 Unspecified lump in the left breast, upper outer quadrant: Secondary | ICD-10-CM

## 2014-02-08 ENCOUNTER — Encounter (INDEPENDENT_AMBULATORY_CARE_PROVIDER_SITE_OTHER): Payer: Self-pay

## 2014-02-08 ENCOUNTER — Ambulatory Visit
Admission: RE | Admit: 2014-02-08 | Discharge: 2014-02-08 | Disposition: A | Discharge status: Home / Self Care | Payer: BC Managed Care – PPO | Source: Ambulatory Visit | Attending: Obstetrics and Gynecology | Admitting: Obstetrics and Gynecology

## 2014-02-08 DIAGNOSIS — N6321 Unspecified lump in the left breast, upper outer quadrant: Secondary | ICD-10-CM

## 2014-02-19 ENCOUNTER — Other Ambulatory Visit: Payer: Self-pay

## 2014-02-19 DIAGNOSIS — Z1231 Encounter for screening mammogram for malignant neoplasm of breast: Secondary | ICD-10-CM

## 2014-03-19 ENCOUNTER — Other Ambulatory Visit: Payer: Self-pay | Admitting: Obstetrics and Gynecology

## 2014-03-20 LAB — CYTOLOGY - PAP

## 2014-03-21 ENCOUNTER — Ambulatory Visit
Admission: RE | Admit: 2014-03-21 | Discharge: 2014-03-21 | Disposition: A | Discharge status: Home / Self Care | Payer: BC Managed Care – PPO | Source: Ambulatory Visit

## 2014-03-21 ENCOUNTER — Other Ambulatory Visit: Payer: Self-pay

## 2014-03-21 ENCOUNTER — Encounter (INDEPENDENT_AMBULATORY_CARE_PROVIDER_SITE_OTHER): Payer: Self-pay

## 2014-03-21 DIAGNOSIS — Z1231 Encounter for screening mammogram for malignant neoplasm of breast: Secondary | ICD-10-CM

## 2015-03-29 ENCOUNTER — Other Ambulatory Visit: Payer: Self-pay | Admitting: Obstetrics and Gynecology

## 2015-03-29 DIAGNOSIS — Z1231 Encounter for screening mammogram for malignant neoplasm of breast: Secondary | ICD-10-CM

## 2015-04-26 ENCOUNTER — Ambulatory Visit: Payer: Self-pay

## 2015-04-30 ENCOUNTER — Ambulatory Visit: Payer: Self-pay

## 2015-05-15 ENCOUNTER — Ambulatory Visit
Admission: RE | Admit: 2015-05-15 | Discharge: 2015-05-15 | Disposition: A | Discharge status: Home / Self Care | Payer: BLUE CROSS/BLUE SHIELD | Source: Ambulatory Visit | Attending: Obstetrics and Gynecology | Admitting: Obstetrics and Gynecology

## 2015-05-15 DIAGNOSIS — Z1231 Encounter for screening mammogram for malignant neoplasm of breast: Secondary | ICD-10-CM

## 2015-05-22 ENCOUNTER — Other Ambulatory Visit: Payer: Self-pay | Admitting: Obstetrics and Gynecology

## 2015-05-22 DIAGNOSIS — Z803 Family history of malignant neoplasm of breast: Secondary | ICD-10-CM

## 2015-07-10 ENCOUNTER — Ambulatory Visit
Admission: RE | Admit: 2015-07-10 | Discharge: 2015-07-10 | Disposition: A | Discharge status: Home / Self Care | Payer: BLUE CROSS/BLUE SHIELD | Source: Ambulatory Visit | Attending: Obstetrics and Gynecology | Admitting: Obstetrics and Gynecology

## 2015-07-10 DIAGNOSIS — Z803 Family history of malignant neoplasm of breast: Secondary | ICD-10-CM

## 2015-07-10 MED ORDER — GADOBENATE DIMEGLUMINE 529 MG/ML IV SOLN
10.0000 mL | Freq: Once | INTRAVENOUS | Status: AC | PRN
  Administered 2015-07-10: 10 mL via INTRAVENOUS

## 2015-09-03 DIAGNOSIS — Z961 Presence of intraocular lens: Secondary | ICD-10-CM | POA: Diagnosis not present

## 2015-09-03 DIAGNOSIS — L719 Rosacea, unspecified: Secondary | ICD-10-CM | POA: Diagnosis not present

## 2015-09-03 DIAGNOSIS — H43811 Vitreous degeneration, right eye: Secondary | ICD-10-CM | POA: Diagnosis not present

## 2015-09-12 ENCOUNTER — Ambulatory Visit: Payer: BLUE CROSS/BLUE SHIELD | Admitting: Nurse Practitioner

## 2015-09-17 DIAGNOSIS — Z681 Body mass index (BMI) 19 or less, adult: Secondary | ICD-10-CM | POA: Diagnosis not present

## 2015-09-17 DIAGNOSIS — R002 Palpitations: Secondary | ICD-10-CM | POA: Diagnosis not present

## 2015-09-17 DIAGNOSIS — R011 Cardiac murmur, unspecified: Secondary | ICD-10-CM | POA: Diagnosis not present

## 2015-09-17 DIAGNOSIS — G43909 Migraine, unspecified, not intractable, without status migrainosus: Secondary | ICD-10-CM | POA: Diagnosis not present

## 2015-09-17 DIAGNOSIS — E782 Mixed hyperlipidemia: Secondary | ICD-10-CM | POA: Diagnosis not present

## 2015-10-04 DIAGNOSIS — H43813 Vitreous degeneration, bilateral: Secondary | ICD-10-CM | POA: Diagnosis not present

## 2015-10-04 DIAGNOSIS — R011 Cardiac murmur, unspecified: Secondary | ICD-10-CM | POA: Diagnosis not present

## 2015-10-04 DIAGNOSIS — E781 Pure hyperglyceridemia: Secondary | ICD-10-CM | POA: Diagnosis not present

## 2015-10-04 DIAGNOSIS — H3509 Other intraretinal microvascular abnormalities: Secondary | ICD-10-CM | POA: Diagnosis not present

## 2015-10-04 DIAGNOSIS — Z136 Encounter for screening for cardiovascular disorders: Secondary | ICD-10-CM | POA: Diagnosis not present

## 2015-10-16 DIAGNOSIS — R011 Cardiac murmur, unspecified: Secondary | ICD-10-CM | POA: Diagnosis not present

## 2015-10-18 DIAGNOSIS — E781 Pure hyperglyceridemia: Secondary | ICD-10-CM | POA: Diagnosis not present

## 2015-10-25 DIAGNOSIS — I2510 Atherosclerotic heart disease of native coronary artery without angina pectoris: Secondary | ICD-10-CM | POA: Diagnosis not present

## 2015-10-25 DIAGNOSIS — R011 Cardiac murmur, unspecified: Secondary | ICD-10-CM | POA: Diagnosis not present

## 2015-11-05 DIAGNOSIS — E781 Pure hyperglyceridemia: Secondary | ICD-10-CM | POA: Diagnosis not present

## 2015-11-05 DIAGNOSIS — R5383 Other fatigue: Secondary | ICD-10-CM | POA: Diagnosis not present

## 2015-11-05 DIAGNOSIS — D649 Anemia, unspecified: Secondary | ICD-10-CM | POA: Diagnosis not present

## 2015-11-05 DIAGNOSIS — Z136 Encounter for screening for cardiovascular disorders: Secondary | ICD-10-CM | POA: Diagnosis not present

## 2015-11-05 DIAGNOSIS — I351 Nonrheumatic aortic (valve) insufficiency: Secondary | ICD-10-CM | POA: Diagnosis not present

## 2015-11-05 DIAGNOSIS — E539 Vitamin B deficiency, unspecified: Secondary | ICD-10-CM | POA: Diagnosis not present

## 2015-11-05 DIAGNOSIS — R200 Anesthesia of skin: Secondary | ICD-10-CM | POA: Diagnosis not present

## 2015-11-11 DIAGNOSIS — E079 Disorder of thyroid, unspecified: Secondary | ICD-10-CM | POA: Diagnosis not present

## 2015-11-11 DIAGNOSIS — R5383 Other fatigue: Secondary | ICD-10-CM | POA: Diagnosis not present

## 2015-11-13 DIAGNOSIS — R5383 Other fatigue: Secondary | ICD-10-CM | POA: Diagnosis not present

## 2015-11-13 DIAGNOSIS — E039 Hypothyroidism, unspecified: Secondary | ICD-10-CM | POA: Diagnosis not present

## 2015-11-13 DIAGNOSIS — G43909 Migraine, unspecified, not intractable, without status migrainosus: Secondary | ICD-10-CM | POA: Diagnosis not present

## 2015-11-13 DIAGNOSIS — R200 Anesthesia of skin: Secondary | ICD-10-CM | POA: Diagnosis not present

## 2015-12-18 DIAGNOSIS — Z0000 Encounter for general adult medical examination without abnormal findings: Secondary | ICD-10-CM | POA: Diagnosis not present

## 2015-12-18 DIAGNOSIS — Z136 Encounter for screening for cardiovascular disorders: Secondary | ICD-10-CM | POA: Diagnosis not present

## 2015-12-20 DIAGNOSIS — Z23 Encounter for immunization: Secondary | ICD-10-CM | POA: Diagnosis not present

## 2015-12-20 DIAGNOSIS — Z0000 Encounter for general adult medical examination without abnormal findings: Secondary | ICD-10-CM | POA: Diagnosis not present

## 2016-01-02 DIAGNOSIS — L821 Other seborrheic keratosis: Secondary | ICD-10-CM | POA: Diagnosis not present

## 2016-01-02 DIAGNOSIS — Z85828 Personal history of other malignant neoplasm of skin: Secondary | ICD-10-CM | POA: Diagnosis not present

## 2016-01-02 DIAGNOSIS — Z85820 Personal history of malignant melanoma of skin: Secondary | ICD-10-CM | POA: Diagnosis not present

## 2016-01-20 DIAGNOSIS — M1990 Unspecified osteoarthritis, unspecified site: Secondary | ICD-10-CM | POA: Diagnosis not present

## 2016-01-20 DIAGNOSIS — E039 Hypothyroidism, unspecified: Secondary | ICD-10-CM | POA: Diagnosis not present

## 2016-01-21 DIAGNOSIS — E781 Pure hyperglyceridemia: Secondary | ICD-10-CM | POA: Diagnosis not present

## 2016-01-22 DIAGNOSIS — R200 Anesthesia of skin: Secondary | ICD-10-CM | POA: Diagnosis not present

## 2016-01-22 DIAGNOSIS — E039 Hypothyroidism, unspecified: Secondary | ICD-10-CM | POA: Diagnosis not present

## 2016-01-22 DIAGNOSIS — G43909 Migraine, unspecified, not intractable, without status migrainosus: Secondary | ICD-10-CM | POA: Diagnosis not present

## 2016-01-22 DIAGNOSIS — M19072 Primary osteoarthritis, left ankle and foot: Secondary | ICD-10-CM | POA: Diagnosis not present

## 2016-01-28 DIAGNOSIS — M25472 Effusion, left ankle: Secondary | ICD-10-CM | POA: Diagnosis not present

## 2016-01-28 DIAGNOSIS — K432 Incisional hernia without obstruction or gangrene: Secondary | ICD-10-CM | POA: Diagnosis not present

## 2016-01-28 DIAGNOSIS — K5000 Crohn's disease of small intestine without complications: Secondary | ICD-10-CM | POA: Diagnosis not present

## 2016-02-13 ENCOUNTER — Ambulatory Visit (INDEPENDENT_AMBULATORY_CARE_PROVIDER_SITE_OTHER): Payer: BLUE CROSS/BLUE SHIELD | Admitting: Sports Medicine

## 2016-02-13 ENCOUNTER — Ambulatory Visit: Payer: Self-pay

## 2016-02-13 ENCOUNTER — Encounter: Payer: Self-pay | Admitting: Sports Medicine

## 2016-02-13 VITALS — BP 123/61 | HR 91 | Ht 67.0 in | Wt 125.0 lb

## 2016-02-13 DIAGNOSIS — M25572 Pain in left ankle and joints of left foot: Secondary | ICD-10-CM | POA: Diagnosis not present

## 2016-02-13 DIAGNOSIS — M76822 Posterior tibial tendinitis, left leg: Secondary | ICD-10-CM | POA: Diagnosis not present

## 2016-02-13 MED ORDER — MELOXICAM 15 MG PO TABS: 15.0000 mg | ORAL_TABLET | Freq: Every day | ORAL | 2 refills | Status: DC

## 2016-02-13 MED ORDER — AMITRIPTYLINE HCL 25 MG PO TABS: 25.0000 mg | ORAL_TABLET | Freq: Every day | ORAL | 1 refills | Status: DC

## 2016-02-13 NOTE — Patient Instructions (Signed)
Please take Mobic every day for the next week and then as needed after that. Would recommend trying to start in the mornings.   Amitriptyline is for use at night to help with cramping and burning pain. Please do exercises with therapy and is demonstrated in office daily, 10 reps, 3 sets as tolerated.

## 2016-02-13 NOTE — Progress Notes (Signed)
Brittany Whitney - 60 y.o. female MRN [id number]  Date of birth: 29-Mar-1956  SUBJECTIVE:  Including CC & ROS.  CC: left ankle pain  Presents with a few months of left ankle pain that has progressively become worse. She points to the medial aspect of her ankle. She has a history of plantar fasciitis but this does not feel the same.  She denies any injury to the area. She states that it gradually occurred over time. Hurts worse at the end of the day.  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 She has a history of ulcerative colitis and has been on prednisone high doses are currently when she was younger. She is currently on a 5 mg every other day dose. She has had to take ciprofloxacin at times due to the ulcerative colitis. She currently has an ileostomy.  Denies any numbness or tingling distally, but does get burning pain at night. She has no history of diabetes mellitus.  ROS: No unexpected weight loss, fever, chills, swelling, instability, muscle pain, numbness/tingling, redness, otherwise see HPI   PMHx - Updated and reviewed.  Contributory factors include: Negative PSHx - Updated and reviewed.  Contributory factors include:  Negative FHx - Updated and reviewed.  Contributory factors include:  Negative Social Hx - Updated and reviewed. Contributory factors include: Negative Medications - reviewed   DATA REVIEWED: None  PHYSICAL EXAM:  VS: BP:123/61  HR:91bpm  TEMP: ( )  RESP:   HT:5' 7"  (170.2 cm)   WT:125 lb (56.7 kg)  BMI:19.6 PHYSICAL EXAM: Gen: NAD, alert, cooperative with exam, well-appearing HEENT: clear conjunctiva,  CV:  no edema, capillary refill brisk, normal rate Resp: non-labored Skin: no rashes, normal turgor  Neuro: no gross deficits.  Psych:  alert and oriented  Ankle & Foot: Does have swelling on the medial aspect of her left foot along the posterior tibialis tendon.  Bunion present on the left No visible ecchymosis,  erythema, ulcers, calluses, blister Arch: Normal w/o pes cavus or planus  Achilles tendon without nodules or tenderness No swelling of retrocalcaneal bursa No pain at MT heads No pain at base of 5th MT; No tenderness over cuboid; No tenderness over N spot or navicular prominence No tenderness on lateral and medial malleolus No sign of peroneal tendon subluxations or tenderness to palpation Full in plantarflexion, dorsiflexion, inversion, and eversion of the foot;  Strength: 5/5 in all directions. Sensation: intact Vascular: intact w/ dorsalis pedis & posterior tibialis pulses 2+ Stable lateral and medial ligaments; Negative Anterior drawer test  Gait  Overall balanced gait with appropriate base width and stride length  Foot: pronation with walking; No in-toeing or out-toeing; No foot slap or high steppage gait  Knee: extension and flexion adequate w/o genu varus or valgus  Hip: No circumduction or contralateral drop  Trunk: Neutral w/o lean   Ultrasound: Limited ultrasound of the left ankle. Posterior tibialis tendon viewed and long and short axis and showed moderate amount of swelling around the tendon. This was followed distally and consistently showed significant amount of swelling surrounding the tendon. No intrasubstance tear seen. Flexor digitorum and flexor hallucis longus reviewed and long and short axis without any tearing. Flexor digitorum did have some mild amount of swelling surrounding. Findings consistent with posterior tibialis tendinitis.   ASSESSMENT & PLAN:   Left tibialis posterior tendinitis Will treat with mobic to decrease inflammation.  Recommend icing especially at night. Would benefit from  green insoles, but too painful right now. Gave scaphoid pads to use.  Ankle compression sleeve did not feel comfortable.   Gave theraband and exercises to do every day. Due to her cramps, we will also do amitriptyline at night. Follow-up in 4 weeks  Patient was counseled  reviewing diagnosis and treatment in detail, totaling in 50 minutes, over half of which was spent in face to face counseling.

## 2016-02-13 NOTE — Assessment & Plan Note (Addendum)
Will treat with mobic to decrease inflammation.  Recommend icing especially at night. Would benefit from green insoles, but too painful right now. Gave scaphoid pads to use.  Ankle compression sleeve did not feel comfortable.   Gave theraband and exercises to do every day. Due to her cramps, we will also do amitriptyline at night. Follow-up in 4 weeks

## 2016-02-20 DIAGNOSIS — Z23 Encounter for immunization: Secondary | ICD-10-CM | POA: Diagnosis not present

## 2016-03-17 ENCOUNTER — Other Ambulatory Visit: Payer: BLUE CROSS/BLUE SHIELD

## 2016-03-17 ENCOUNTER — Other Ambulatory Visit: Payer: Self-pay | Admitting: Internal Medicine

## 2016-03-17 DIAGNOSIS — M25572 Pain in left ankle and joints of left foot: Secondary | ICD-10-CM

## 2016-03-17 DIAGNOSIS — R5383 Other fatigue: Secondary | ICD-10-CM | POA: Diagnosis not present

## 2016-03-17 DIAGNOSIS — M2550 Pain in unspecified joint: Secondary | ICD-10-CM | POA: Diagnosis not present

## 2016-03-17 DIAGNOSIS — Z6820 Body mass index (BMI) 20.0-20.9, adult: Secondary | ICD-10-CM | POA: Diagnosis not present

## 2016-03-18 ENCOUNTER — Ambulatory Visit
Admission: RE | Admit: 2016-03-18 | Discharge: 2016-03-18 | Disposition: A | Discharge status: Home / Self Care | Payer: BLUE CROSS/BLUE SHIELD | Source: Ambulatory Visit | Attending: Internal Medicine | Admitting: Internal Medicine

## 2016-03-18 DIAGNOSIS — M25572 Pain in left ankle and joints of left foot: Secondary | ICD-10-CM

## 2016-03-19 ENCOUNTER — Other Ambulatory Visit: Payer: BLUE CROSS/BLUE SHIELD

## 2016-03-24 DIAGNOSIS — E039 Hypothyroidism, unspecified: Secondary | ICD-10-CM | POA: Diagnosis not present

## 2016-03-26 DIAGNOSIS — Z681 Body mass index (BMI) 19 or less, adult: Secondary | ICD-10-CM | POA: Diagnosis not present

## 2016-03-26 DIAGNOSIS — M19072 Primary osteoarthritis, left ankle and foot: Secondary | ICD-10-CM | POA: Diagnosis not present

## 2016-03-26 DIAGNOSIS — E039 Hypothyroidism, unspecified: Secondary | ICD-10-CM | POA: Diagnosis not present

## 2016-03-26 DIAGNOSIS — K5090 Crohn's disease, unspecified, without complications: Secondary | ICD-10-CM | POA: Diagnosis not present

## 2016-03-31 ENCOUNTER — Encounter: Payer: Self-pay | Admitting: Sports Medicine

## 2016-03-31 ENCOUNTER — Ambulatory Visit (INDEPENDENT_AMBULATORY_CARE_PROVIDER_SITE_OTHER): Payer: BLUE CROSS/BLUE SHIELD | Admitting: Sports Medicine

## 2016-03-31 DIAGNOSIS — M25579 Pain in unspecified ankle and joints of unspecified foot: Secondary | ICD-10-CM | POA: Insufficient documentation

## 2016-03-31 DIAGNOSIS — M76822 Posterior tibial tendinitis, left leg: Secondary | ICD-10-CM | POA: Diagnosis not present

## 2016-03-31 DIAGNOSIS — M25572 Pain in left ankle and joints of left foot: Secondary | ICD-10-CM | POA: Diagnosis not present

## 2016-03-31 DIAGNOSIS — K5090 Crohn's disease, unspecified, without complications: Secondary | ICD-10-CM

## 2016-03-31 MED ORDER — TRAMADOL HCL 50 MG PO TABS: 50.0000 mg | ORAL_TABLET | Freq: Two times a day (BID) | ORAL | 1 refills | Status: DC

## 2016-03-31 NOTE — Patient Instructions (Signed)
Standing on a book 3 sets of 15 Both feet Toes in Toes out Toes neutral  Stand and 1 foot balance 10 repeats of 10 secs  Stand and reach  3 sets of 5  For pain Try 2 tramadol a day and add aleve when you get breakthrough pain  Find the right ankle brace we can use for the next 2 months  See me in 2 months

## 2016-03-31 NOTE — Progress Notes (Signed)
CC: Left ankle pain  Patient returns for left ankle pain We diagnosed her with posterior tibialis tendinitis She was sent by her primary physician to Dr. Amil Amen Primarily because she has a history of Crohn's disease that did affect her lower trunk with erythema nodosum and some joint pain  She thinks this feels differently This did start after a fall when she slipped in a grocery store in August  Since last visit she hasn't an MRI the ankle This was reviewed today  Past history Crohn's disease Prior abdominal surgeries Long term use of prednisone   Review of systems Swelling in the ankle is primarily behind the medial malleolus Minimal swelling laterally Left ankle feels less stable  Physical examination Thin white female in no acute distress BP (!) 111/47   Ht 5' 7"  (1.702 m)   Wt 125 lb (56.7 kg)   BMI 19.58 kg/m   Left ankle shows a positive drawer sign There is mild pain on inversion No real swelling behind the lateral malleolus slight tenderness to palpation Slight swelling over the posterior tibialis Posterior tibialis flexion preserved Other ankle ligaments are stable and motion is normal 1 foot balance is poor  Review of MRI images She primarily has findings of posterior tibialis tendinopathy with increased swelling There is some minor arthritic change noted in the ankle joint However, the anterior talofibular ligament appears partially torn There is also some mild swelling in the peroneal tendons and the anterior tibialis

## 2016-03-31 NOTE — Assessment & Plan Note (Signed)
I reassured her that she does not show significant arthritis I do not think this relates to the Crohn's  I think her injury in August may been more significant and his left ankle somewhat unstable We will use an ankle compression sleeve  I gave her a series of exercises to help stabilize the ankle  Recheck in 6 weeks

## 2016-03-31 NOTE — Assessment & Plan Note (Signed)
Because of ongoing pain we will add some tramadol twice a day She gets better relief with Aleve than meloxicam She can take Aleve for breakthrough pain  I think the tendinitis history about ankle instability Once that is stabilize it it should improve faster  History of migraine and not a good candidate for nitroglycerin

## 2016-04-02 DIAGNOSIS — M2550 Pain in unspecified joint: Secondary | ICD-10-CM | POA: Diagnosis not present

## 2016-04-02 DIAGNOSIS — Z01419 Encounter for gynecological examination (general) (routine) without abnormal findings: Secondary | ICD-10-CM | POA: Diagnosis not present

## 2016-04-02 DIAGNOSIS — M25572 Pain in left ankle and joints of left foot: Secondary | ICD-10-CM | POA: Diagnosis not present

## 2016-04-02 DIAGNOSIS — Z6820 Body mass index (BMI) 20.0-20.9, adult: Secondary | ICD-10-CM | POA: Diagnosis not present

## 2016-04-02 DIAGNOSIS — Z1321 Encounter for screening for nutritional disorder: Secondary | ICD-10-CM | POA: Diagnosis not present

## 2016-05-04 ENCOUNTER — Other Ambulatory Visit: Payer: Self-pay | Admitting: *Deleted

## 2016-05-04 MED ORDER — MELOXICAM 15 MG PO TABS: 15.0000 mg | ORAL_TABLET | Freq: Every day | ORAL | 2 refills | Status: DC

## 2016-05-21 DIAGNOSIS — R799 Abnormal finding of blood chemistry, unspecified: Secondary | ICD-10-CM | POA: Diagnosis not present

## 2016-06-02 ENCOUNTER — Ambulatory Visit (INDEPENDENT_AMBULATORY_CARE_PROVIDER_SITE_OTHER): Payer: BLUE CROSS/BLUE SHIELD | Admitting: Sports Medicine

## 2016-06-02 DIAGNOSIS — M76822 Posterior tibial tendinitis, left leg: Secondary | ICD-10-CM | POA: Diagnosis not present

## 2016-06-02 NOTE — Progress Notes (Signed)
  Brittany Whitney - 61 y.o. female MRN [id number]  Date of birth: [birth year]  SUBJECTIVE:  Including CC & ROS.   Brittany Whitney is a 61 year old female and is following up for left ankle pain. She has been diagnosed with posterior tibialis tendinitis. She has been trying to perform exercise but admits is not performing them on a regular basis. She is unable to wear the ankle compression on a regular basis because it causes some discomfort. Walking with shoes that don't have support seem to exacerbate her pain. She did buy some orthotics online that seemed to improve her pain.  ROS: No unexpected weight loss, fever, chills,  numbness/tingling, redness, otherwise see HPI    HISTORY: Past Medical, Surgical, Social, and Family History Reviewed & Updated per EMR.   Pertinent Historical Findings include: PMSHx -  Crohn's disease   DATA REVIEWED: 03/18/16: MRI left ankle: 1. Moderate tibialis posterior tenosynovitis and tendinopathy. Correlate clinically in assessing for tibialis posterior dysfunction.  2. Flexor hallucis longus tenosynovitis. 3. There is some low-grade marrow edema in the sustentaculum tali, likely reactive. 4. Somewhat exaggerated the size of the recess between the medioplantar oblique and inferoplantar longitudinal component of the spring ligament. Sometimes this can indicate a small tear of the medioplantar oblique portion. 5. Mild common peroneus tenosynovitis. 6. Torn anterior talofibular ligament. Mildly thickened calcaneofibular ligament might be sprained. 7. Small tibiotalar joint effusion and small effusion of the talonavicular articulation.  PHYSICAL EXAM:  VS: BP:113/60  HR: bpm  TEMP: ( )  RESP:   HT:5' 7"  (170.2 cm)   WT:125 lb (56.7 kg)  BMI:19.6 PHYSICAL EXAM: Gen: NAD, alert, cooperative with exam, well-appearing HEENT: clear conjunctiva, EOMI CV:  no edema, capillary refill brisk,  Resp: non-labored, normal speech Skin: no rashes, normal turgor  Neuro: no gross  deficits.  Psych:  alert and oriented Left ankle: Obvious swelling of the distal posterior tibialis into the navicular. Some tenderness to palpation over the posterior tibialis coming out tarsal tunnel. Still able to have inversion to resistance. Normal strength in other directions as well. Normal range of motion in all directions. Longitudinal arch is fairly preserved. A bunionette and hallux valgus occurring on the left foot. No pain to palpation over the Achilles. Neurovascularly intact Able to achieve a single leg raise  ASSESSMENT & PLAN:   Left tibialis posterior tendinitis She notices a 50% improvement since last time she was seen. She has been able to do the exercises but not on a regular basis. - She will continue to perform her home exercises. - Encouraged to wear the compression sleeve as she tolerates - She'll continue to use mobic and tramadol for pain - She will follow-up as needed.

## 2016-06-02 NOTE — Assessment & Plan Note (Signed)
She notices a 50% improvement since last time she was seen. She has been able to do the exercises but not on a regular basis. - She will continue to perform her home exercises. - Encouraged to wear the compression sleeve as she tolerates - She'll continue to use mobic and tramadol for pain - She will follow-up as needed.

## 2016-06-15 ENCOUNTER — Other Ambulatory Visit: Payer: Self-pay | Admitting: Obstetrics and Gynecology

## 2016-06-15 DIAGNOSIS — Z1231 Encounter for screening mammogram for malignant neoplasm of breast: Secondary | ICD-10-CM

## 2016-06-15 DIAGNOSIS — R1032 Left lower quadrant pain: Secondary | ICD-10-CM | POA: Diagnosis not present

## 2016-06-23 ENCOUNTER — Ambulatory Visit
Admission: RE | Admit: 2016-06-23 | Discharge: 2016-06-23 | Disposition: A | Discharge status: Home / Self Care | Payer: BLUE CROSS/BLUE SHIELD | Source: Ambulatory Visit | Attending: Obstetrics and Gynecology | Admitting: Obstetrics and Gynecology

## 2016-06-23 DIAGNOSIS — Z1231 Encounter for screening mammogram for malignant neoplasm of breast: Secondary | ICD-10-CM | POA: Diagnosis not present

## 2016-06-24 DIAGNOSIS — M2550 Pain in unspecified joint: Secondary | ICD-10-CM | POA: Diagnosis not present

## 2016-06-24 DIAGNOSIS — M25572 Pain in left ankle and joints of left foot: Secondary | ICD-10-CM | POA: Diagnosis not present

## 2016-06-24 DIAGNOSIS — M542 Cervicalgia: Secondary | ICD-10-CM | POA: Diagnosis not present

## 2016-06-24 DIAGNOSIS — M79601 Pain in right arm: Secondary | ICD-10-CM | POA: Diagnosis not present

## 2016-06-25 DIAGNOSIS — E559 Vitamin D deficiency, unspecified: Secondary | ICD-10-CM | POA: Diagnosis not present

## 2016-06-25 DIAGNOSIS — Z681 Body mass index (BMI) 19 or less, adult: Secondary | ICD-10-CM | POA: Diagnosis not present

## 2016-06-25 DIAGNOSIS — Z23 Encounter for immunization: Secondary | ICD-10-CM | POA: Diagnosis not present

## 2016-06-25 DIAGNOSIS — E782 Mixed hyperlipidemia: Secondary | ICD-10-CM | POA: Diagnosis not present

## 2016-06-25 DIAGNOSIS — E039 Hypothyroidism, unspecified: Secondary | ICD-10-CM | POA: Diagnosis not present

## 2016-07-07 ENCOUNTER — Ambulatory Visit (INDEPENDENT_AMBULATORY_CARE_PROVIDER_SITE_OTHER): Payer: BLUE CROSS/BLUE SHIELD | Admitting: Sports Medicine

## 2016-07-07 ENCOUNTER — Encounter: Payer: Self-pay | Admitting: Sports Medicine

## 2016-07-07 DIAGNOSIS — M5010 Cervical disc disorder with radiculopathy, unspecified cervical region: Secondary | ICD-10-CM | POA: Diagnosis not present

## 2016-07-07 MED ORDER — TRAMADOL HCL 50 MG PO TABS: 50.0000 mg | ORAL_TABLET | Freq: Two times a day (BID) | ORAL | 1 refills | Status: DC

## 2016-07-07 NOTE — Assessment & Plan Note (Signed)
We will try to increase the tramadol twice a day She had marked drowsiness with amitriptyline he would not like to use that Vitamin B6 100 mg daily  Tried to use a travel pillow for sleep and see if she can stop the neck flexion during sleep  If not better after a month we should probably scan the carpal tunnel and wrist to be sure this is not a local symptoms If not better I want to see the actual x-ray images

## 2016-07-07 NOTE — Progress Notes (Signed)
RT Wrist Pain  3 weeks of right wrist pain Difficulty making a fist Flexing the fourth finger is difficult She saw Dr. Rondel Oh Lab tests did not suggest that this was related to her Crohn's He felt she had some cervical radiculopathy and obtained x-rays But reportedly shows some distal degenerative changes  She had an IM steroid injection because she was going on a ski trip This helped for about 2 weeks  Past history Crohn's colitis Status post surgical colectomy  Review of systems Some neck pain while sleeping as she sleeps on her side Right arm numbness with waking up Symptoms are primarily in an ulnar distribution  Physical examination Pleasant female in no acute distress BP 126/60   Ht 5' 7"  (1.702 m)   Wt 122 lb (55.3 kg)   BMI 19.11 kg/m   Weakness on the left side only with wrist flexion Weakness with fourth finger flexion Motion of the wrist is normal Other strength testing is normal Phalen's test does not increase symptoms  No palpable areas of tenderness No swelling

## 2016-07-07 NOTE — Patient Instructions (Signed)
Lets start vitamin B6 100 mg daily  Use your tramadol now morning and evening  Try sleeping in a collar for 2 weeks  See if symptoms start getting less and strength returns

## 2016-07-08 DIAGNOSIS — N7011 Chronic salpingitis: Secondary | ICD-10-CM | POA: Diagnosis not present

## 2016-07-09 DIAGNOSIS — Z85820 Personal history of malignant melanoma of skin: Secondary | ICD-10-CM | POA: Diagnosis not present

## 2016-07-09 DIAGNOSIS — D1801 Hemangioma of skin and subcutaneous tissue: Secondary | ICD-10-CM | POA: Diagnosis not present

## 2016-07-09 DIAGNOSIS — Z85828 Personal history of other malignant neoplasm of skin: Secondary | ICD-10-CM | POA: Diagnosis not present

## 2016-07-09 DIAGNOSIS — L821 Other seborrheic keratosis: Secondary | ICD-10-CM | POA: Diagnosis not present

## 2016-07-21 ENCOUNTER — Ambulatory Visit: Payer: BLUE CROSS/BLUE SHIELD | Admitting: Sports Medicine

## 2016-07-27 DIAGNOSIS — M79641 Pain in right hand: Secondary | ICD-10-CM | POA: Diagnosis not present

## 2016-08-03 ENCOUNTER — Other Ambulatory Visit: Payer: Self-pay | Admitting: *Deleted

## 2016-08-03 MED ORDER — MELOXICAM 15 MG PO TABS: 15.0000 mg | ORAL_TABLET | Freq: Every day | ORAL | 2 refills | Status: DC

## 2016-08-06 DIAGNOSIS — M79641 Pain in right hand: Secondary | ICD-10-CM | POA: Diagnosis not present

## 2016-08-17 DIAGNOSIS — G5601 Carpal tunnel syndrome, right upper limb: Secondary | ICD-10-CM | POA: Diagnosis not present

## 2016-08-17 DIAGNOSIS — E039 Hypothyroidism, unspecified: Secondary | ICD-10-CM | POA: Diagnosis not present

## 2016-08-17 DIAGNOSIS — M659 Synovitis and tenosynovitis, unspecified: Secondary | ICD-10-CM | POA: Diagnosis not present

## 2016-08-18 ENCOUNTER — Ambulatory Visit: Payer: BLUE CROSS/BLUE SHIELD | Admitting: Sports Medicine

## 2016-08-20 DIAGNOSIS — K5090 Crohn's disease, unspecified, without complications: Secondary | ICD-10-CM | POA: Diagnosis not present

## 2016-08-20 DIAGNOSIS — E782 Mixed hyperlipidemia: Secondary | ICD-10-CM | POA: Diagnosis not present

## 2016-08-20 DIAGNOSIS — E039 Hypothyroidism, unspecified: Secondary | ICD-10-CM | POA: Diagnosis not present

## 2016-08-20 DIAGNOSIS — Z23 Encounter for immunization: Secondary | ICD-10-CM | POA: Diagnosis not present

## 2016-08-20 DIAGNOSIS — R011 Cardiac murmur, unspecified: Secondary | ICD-10-CM | POA: Diagnosis not present

## 2016-09-01 ENCOUNTER — Other Ambulatory Visit: Payer: Self-pay | Admitting: *Deleted

## 2016-09-01 MED ORDER — MELOXICAM 15 MG PO TABS
15.0000 mg | ORAL_TABLET | Freq: Every day | ORAL | 2 refills | Status: DC
Start: 2016-09-01 — End: 2017-09-06

## 2016-09-18 DIAGNOSIS — M659 Synovitis and tenosynovitis, unspecified: Secondary | ICD-10-CM | POA: Diagnosis not present

## 2016-09-18 DIAGNOSIS — M79641 Pain in right hand: Secondary | ICD-10-CM | POA: Diagnosis not present

## 2016-09-18 DIAGNOSIS — G5601 Carpal tunnel syndrome, right upper limb: Secondary | ICD-10-CM | POA: Diagnosis not present

## 2016-09-18 HISTORY — PX: CARPAL TUNNEL RELEASE: SHX101

## 2016-09-22 DIAGNOSIS — G8918 Other acute postprocedural pain: Secondary | ICD-10-CM | POA: Diagnosis not present

## 2016-09-22 DIAGNOSIS — M65831 Other synovitis and tenosynovitis, right forearm: Secondary | ICD-10-CM | POA: Diagnosis not present

## 2016-09-22 DIAGNOSIS — G5601 Carpal tunnel syndrome, right upper limb: Secondary | ICD-10-CM | POA: Diagnosis not present

## 2016-09-24 DIAGNOSIS — I351 Nonrheumatic aortic (valve) insufficiency: Secondary | ICD-10-CM | POA: Diagnosis not present

## 2016-10-05 DIAGNOSIS — M79641 Pain in right hand: Secondary | ICD-10-CM | POA: Diagnosis not present

## 2016-10-06 DIAGNOSIS — E781 Pure hyperglyceridemia: Secondary | ICD-10-CM | POA: Diagnosis not present

## 2016-10-08 ENCOUNTER — Other Ambulatory Visit: Payer: Self-pay | Admitting: *Deleted

## 2016-10-08 MED ORDER — TRAMADOL HCL 50 MG PO TABS: 50.0000 mg | ORAL_TABLET | Freq: Two times a day (BID) | ORAL | 1 refills | Status: DC

## 2016-10-12 DIAGNOSIS — Z8719 Personal history of other diseases of the digestive system: Secondary | ICD-10-CM | POA: Diagnosis not present

## 2016-10-12 DIAGNOSIS — E781 Pure hyperglyceridemia: Secondary | ICD-10-CM | POA: Diagnosis not present

## 2016-10-12 DIAGNOSIS — I359 Nonrheumatic aortic valve disorder, unspecified: Secondary | ICD-10-CM | POA: Diagnosis not present

## 2016-10-13 DIAGNOSIS — M359 Systemic involvement of connective tissue, unspecified: Secondary | ICD-10-CM | POA: Diagnosis not present

## 2016-10-14 DIAGNOSIS — L719 Rosacea, unspecified: Secondary | ICD-10-CM | POA: Diagnosis not present

## 2016-10-14 DIAGNOSIS — H43813 Vitreous degeneration, bilateral: Secondary | ICD-10-CM | POA: Diagnosis not present

## 2016-10-14 DIAGNOSIS — Z961 Presence of intraocular lens: Secondary | ICD-10-CM | POA: Diagnosis not present

## 2016-10-25 DIAGNOSIS — I351 Nonrheumatic aortic (valve) insufficiency: Secondary | ICD-10-CM | POA: Diagnosis present

## 2016-10-25 DIAGNOSIS — R0600 Dyspnea, unspecified: Secondary | ICD-10-CM | POA: Diagnosis present

## 2016-10-25 DIAGNOSIS — R0609 Other forms of dyspnea: Secondary | ICD-10-CM | POA: Diagnosis present

## 2016-10-25 NOTE — H&P (Signed)
OFFICE VISIT NOTES COPIED TO EPIC FOR DOCUMENTATION  . History of Present Illness Brittany Page MD; 10/13/2016 10:08 AM) Patient words: Last O/V 01/21/2016; 8 Mth F/U, Echo, Lab results.  The patient is a 61 year old female who presents with aortic regurgitation. She has a history of mild hyperlipidemia, she was evaluated a year ago with echocardiogram for a new murmur and was found to have moderately severe AR, however asymptomatic and hence recommended continued observation. She underwent repeat echocardiogram on 09/24/2016 and presence of a follow-up.  She initially denied any shortness of breath, on further questioning, she states that she has had dyspnea on exertion throughout entire lifeand she cannot walk and talk at the same time and she definitely cannot walk any incline without having to stop. She does not exercise due to this and has been doing strength training only. But states this has remained stable over the years and denies any PND or orthopnea. However in 2017 she did walk for 9:40 minutes on Bruce protocol without evidence of ischemia.  She denies any chest pain, edema, dizziness, syncope or symptoms suggestive of TIA or claudication. Approximately 2 weeks ago, she developed palpitations suggestive of frequent PVCs, discontinued allergy medicine and symptoms resolved. No history of HTN, diabetes, or tobacco use.   Problem List/Past Medical (Brittany Whitney; 10/20/16 3:14 PM) Body mass index (BMI) less than 19 (Z68.1)  Vitamin D deficiency (E55.9)  History of migraine headaches (Z86.69)  Crohn's ileitis (K50.00)  Aortic valve regurgitation, unspecified etiology  Echocardiogram 09/24/2016: Left ventricle cavity is normal in size. Low normal LV systolic function. Left ventricle regional wall motion findings: No wall motion abnormalities. Visual EF 50% Calculated EF 55%. LVIDD 5.2 cm. No evidence of aortic valve stenosis. Increased pressure gradients is due to increased flow  due to aortic regurgitation. Moderate to severe (Grade III) aortic regurgitation. Moderate tricuspid regurgitation. Moderate to severe pulmonary hypertension. Pulmonary artery systolic pressure is estimated at 62 mm Hg. The aortic root is mildly dilated. at 4.1 cm. Compared to the study done on 10/16/2015, pulmonary hypertension is new, aortic root size no enlarged. Hypertriglyceridemia (E78.1)  Screening for ischemic heart disease (Z13.6)   Allergies (Brittany Whitney; 10-20-2016 3:14 PM) Flagyl *ANTI-INFECTIVE AGENTS - MISC.*  Numbness Penicillins  Hives. Clindamycin HCl *CHEMICALS*  Rash.  Family History (Brittany Whitney; 10-20-16 3:14 PM) Mother  Deceased. at age 6 from Breast Cancer; No known Heart conditions Father  Deceased. at age 53 from Unknown Causes; No known Heart conditions Sister 1  Younger; Has DM Type 1, h/o aortic valve replacement ([facility]) Brother 1  In good health. Older  Social History (Brittany Whitney; 2016-10-20 3:14 PM) Current tobacco use  Never smoker. Alcohol Use  Drinks wine. Marital status  Married. Number of Children  2. Living Situation  Lives with spouse.  Past Surgical History (Brittany Whitney; 10/20/16 3:27 PM) Total Proctocolectomy [1988]: with formation of Adelene Amas at [facility]; Removal 1995 Creation of Temporary Broke ileostomy [1994]: Cesarean Delivery [1995]: 1987 Breast Biopsy - Right [1998]: Benign Carpal Tunnel Surgery - Right [09/22/2016]:  Medication History (Brittany Whitney; 10-20-16 3:43 PM) PredniSONE (5MG Tablet, 1 Oral every other day) Active. Doxycycline Hyclate (20MG Tablet, 1 Oral two times daily) Active. (for chronic blepharitis) Estradiol (0.025MG/24HR Patch TW, 1 Transdermal two times weekly) Active. Prometrium (100MG Capsule, 1 Oral at bedtime) Active. Testosterone (1% Gel, Transdermal daily) Active. Vitamin D3 (50000UNIT Tablet, 1 Oral two times monthly) Active. Vitamin D3 (5000UNIT Tablet, 1 Oral two times  daily) Active.  Calcium (500MG Tablet, 1 Oral two times daily) Active. (with Magnesium 282m) Multiple Vitamin (1 (one) Oral daily) Active. DHEA (10MG Tablet, 1 Oral daily) Active. Biotin (5000MCG Tab Sublingual, 1 Sublingual daily) Active. Devrom (200MG Tablet Chewable, 1 Oral two times daily) Active. Zolpidem Tartrate (5MG Tablet, 1 Oral as needed) Active. Imitrex (50MG Tablet, 1 Oral as needed) Active. Amerge (2.5MG Tablet, 1 Oral as needed) Active. DIINDOLYMETHANE (102mtwo times daily) Active. Levothyroxine Sodium (50MCG Tablet, 1 Oral every other day) Active. ALPRAZolam (0.25MG Tablet, 1 Oral as needed) Active. TraMADol HCl (50MG Tablet, 1 Oral as needed) Active. Meloxicam (15MG Tablet, 1 Oral daily) Active. Levothyroxine Sodium (75MCG Tablet, 1 Oral every other day) Active. Medications Reconciled (Pt brought list)  Diagnostic Studies History (Brittany Whitney; 6/Nov 10, 2016:30 PM) Echocardiogram [09/24/2016]: Left ventricle cavity is normal in size. Low normal LV systolic function. Left ventricle regional wall motion findings: No wall motion abnormalities. Visual EF 50% Calculated EF 55%. LVIDD 5.2 cm. No evidence of aortic valve stenosis. Increased pressure gradients is due to increased flow due to aortic regurgitation. Moderate to severe (Grade III) aortic regurgitation. Moderate tricuspid regurgitation. Moderate to severe pulmonary hypertension. Pulmonary artery systolic pressure is estimated at 62 mm Hg. The aortic root is mildly dilated. at 4.1 cm. Compared to the study done on 10/16/2015, pulmonary hypertension is new, aortic root size no enlarged. Labwork  10/07/2016: Cholesterol 214, triglycerides 382, HDL 52, LDL 86.  12/18/2015: TSH 4.14, CBC normal, BUN 30, creatinine 1.03, eGFR 59, potassium 4.1, CMP otherwise normal  10/18/2015: total cholesterol 213, triglycerides 395, HDL 49, LDL 85  10/18/2015: Total cholesterol 213, triglycerides 395, HDL 49, LDL  85  11/15/2013: Total cholesterol 235, triglycerides 389, HDL 59, LDL 98. Non-HDL cholesterol 175.    Review of Systems (Brittany Whitney; 10/13/2016 10:08 AM) General Not Present- Anorexia, Fatigue and Fever. Respiratory Present- Decreased Exercise Tolerance (chronic) and Difficulty Breathing on Exertion. Not Present- Cough and Dyspnea. Cardiovascular Not Present- Chest Pain, Claudications, Edema, Orthopnea, Palpitations and Paroxysmal Nocturnal Dyspnea. Gastrointestinal Not Present- Black, Tarry Stool, Change in Bowel Habits and Nausea. Neurological Not Present- Focal Neurological Symptoms and Syncope. Endocrine Not Present- Cold Intolerance, Excessive Sweating, Heat Intolerance and Thyroid Problems. Hematology Not Present- Anemia, Easy Bruising, Petechiae and Prolonged Bleeding. All other systems negative  Vitals (Brittany Whitney; 6/07-24-2018:46 PM) 09/2016/07/24:23 PM Weight: 124.5 lb Height: 67in Body Surface Area: 1.65 m Body Mass Index: 19.5 kg/m  Pulse: 91 (Regular)  P.OX: 99% (Room air) BP: 110/66 (Sitting, Left Arm, Standard)       Physical Exam (Brittany PageMD; 10/13/2016 10:16 AM) General Mental Status-Alert. General Appearance-Cooperative and Appears younger than stated age. Build & Nutrition-Lean and Moderately built.  Head and Neck Thyroid Gland Characteristics - normal size and consistency and no palpable nodules.  Chest and Lung Exam Chest and lung exam reveals -quiet, even and easy respiratory effort with no use of accessory muscles, non-tender and on auscultation, normal breath sounds, no adventitious sounds.  Cardiovascular Cardiovascular examination reveals -carotid auscultation reveals no bruits, abdominal aorta auscultation reveals no bruits and no prominent pulsation, femoral artery auscultation bilaterally reveals normal pulses, no bruits, no thrills, normal pedal pulses bilaterally and no digital clubbing, cyanosis,  edema, increased warmth or tenderness. Auscultation Rhythm - Regular. Heart Sounds - S1 WNL and S2 WNL. Murmurs & Other Heart Sounds: Murmur - Location - Aortic Area. Timing - Diastolic. Grade - II/VI. Murmurs & Other Heart Sounds - Midsystolic Click - Mitral Area.  Abdomen Palpation/Percussion Normal exam - Non Tender and No hepatosplenomegaly.  Neurologic Neurologic evaluation reveals -alert and oriented x 3 with no impairment of recent or remote memory. Motor-Grossly intact without any focal deficits.  Musculoskeletal Global Assessment Left Lower Extremity - no deformities, masses or tenderness, no known fractures. Right Lower Extremity - no deformities, masses or tenderness, no known fractures.    Assessment & Plan Brittany Page MD; 10/13/2016 10:15 AM) Aortic valve regurgitation, unspecified etiology Story: Echocardiogram 09/24/2016: Left ventricle cavity is normal in size. Low normal LV systolic function. Left ventricle regional wall motion findings: No wall motion abnormalities. Visual EF 50% Calculated EF 55%. LVIDD 5.2 cm. No evidence of aortic valve stenosis. Increased pressure gradients is due to increased flow due to aortic regurgitation. Moderate to severe (Grade III) aortic regurgitation. Moderate tricuspid regurgitation. Moderate to severe pulmonary hypertension. Pulmonary artery systolic pressure is estimated at 62 mm Hg. The aortic root is mildly dilated. at 4.1 cm. Compared to the study done on 10/16/2015, pulmonary hypertension is new, aortic root size no enlarged. Impression: EKG 10/12/2016: Normal sinus rhythm at a rate of 78 bpm, left atrial enlargement, leftward axis, normal QT interval. No evidence of ischemia. No significant change from EKG 10/04/2015. Current Plans Complete electrocardiogram (93000) Future Plans 9/37/9024: METABOLIC PANEL, BASIC (09735) - one time 10/15/2016: CBC & PLATELETS (AUTO) (32992) - one time 10/15/2016: PT (PROTHROMBIN TIME)  (42683) - one time H/O Crohn's disease (Z87.19) Story: S/P colectomy total 1988. and in remission Labwork Story: 10/13/2016: CBC normal. Protime normal.  ANA negative.  10/07/2016: Cholesterol 214, triglycerides 382, HDL 52, LDL 86.  12/18/2015: TSH 4.14, CBC normal, BUN 30, creatinine 1.03, eGFR 59, potassium 4.1, CMP otherwise normal  10/18/2015: total cholesterol 213, triglycerides 395, HDL 49, LDL 85  10/18/2015: Total cholesterol 213, triglycerides 395, HDL 49, LDL 85  11/15/2013: Total cholesterol 235, triglycerides 389, HDL 59, LDL 98. Non-HDL cholesterol 175. Hypertriglyceridemia (E78.1) Current Plans Started Omega-3-acid Ethyl Esters 1GM, 2 (two) Capsule daily with food, #60, 10/12/2016, Ref. x3. Mechanism of underlying disease process and action of medications discussed with the patient. I discussed primary/secondary prevention. She presents for 9monthfollow-up of aortic insufficiency. Denies any new symptoms or concerns today. Continues to remain asymptomatic. I'm surprised by the findings of severe pulmonary hypertension, no significant change in aortic valve regurgitation. She does have history of Crohn's disease but no known connective tissue disease. She does have generalized arthritis and she has been on prednisone for years and is trying to wean it off. I discussed with Dr. JLeigh Aurora he plans to perform connective tissue disorder workup. The question is whether the pulmonary hypertension is related to valvular heart disease or is primary pulmonary hypertension. She needs right and left heart catheterization to delineate.Schedule for cardiac catheterization, and possible angioplasty. We discussed regarding risks, benefits, alternatives to this including stress testing, CTA and continued medical therapy. Patient wants to proceed. Understands <1% risk of death, stroke, MI, urgent CABG, bleeding, infection, renal failure but not limited to these.  With regard to  hypertriglyceridemia, I discussed with her that there is no data regarding primary prevention, however triglycerides continued to markedly elevated, I will start her on Lovaza. She was unable to obtain Vascepa due to insurance denial.  CC: ERachell Cipro MD; CC: JLeigh Aurora   Signed by Brittany Page MD (10/13/2016 10:17 AM)

## 2016-10-27 ENCOUNTER — Ambulatory Visit (HOSPITAL_COMMUNITY)
Admission: RE | Disposition: A | Discharge status: Home / Self Care | Payer: Self-pay | Source: Ambulatory Visit | Attending: Cardiology

## 2016-10-27 ENCOUNTER — Encounter (HOSPITAL_COMMUNITY): Payer: Self-pay | Admitting: Cardiology

## 2016-10-27 ENCOUNTER — Ambulatory Visit (HOSPITAL_COMMUNITY)
Admission: RE | Admit: 2016-10-27 | Discharge: 2016-10-27 | Disposition: A | Discharge status: Home / Self Care | Payer: BLUE CROSS/BLUE SHIELD | Source: Ambulatory Visit | Attending: Cardiology | Admitting: Cardiology

## 2016-10-27 DIAGNOSIS — G43909 Migraine, unspecified, not intractable, without status migrainosus: Secondary | ICD-10-CM | POA: Diagnosis not present

## 2016-10-27 DIAGNOSIS — E559 Vitamin D deficiency, unspecified: Secondary | ICD-10-CM | POA: Insufficient documentation

## 2016-10-27 DIAGNOSIS — E781 Pure hyperglyceridemia: Secondary | ICD-10-CM | POA: Diagnosis not present

## 2016-10-27 DIAGNOSIS — R0600 Dyspnea, unspecified: Secondary | ICD-10-CM | POA: Diagnosis present

## 2016-10-27 DIAGNOSIS — R0609 Other forms of dyspnea: Secondary | ICD-10-CM | POA: Diagnosis present

## 2016-10-27 DIAGNOSIS — Z7952 Long term (current) use of systemic steroids: Secondary | ICD-10-CM | POA: Diagnosis not present

## 2016-10-27 DIAGNOSIS — I351 Nonrheumatic aortic (valve) insufficiency: Secondary | ICD-10-CM | POA: Diagnosis present

## 2016-10-27 DIAGNOSIS — K5000 Crohn's disease of small intestine without complications: Secondary | ICD-10-CM | POA: Diagnosis not present

## 2016-10-27 HISTORY — PX: RIGHT/LEFT HEART CATH AND CORONARY ANGIOGRAPHY: CATH118266

## 2016-10-27 HISTORY — PX: THORACIC AORTOGRAM: CATH118269

## 2016-10-27 LAB — POCT I-STAT 3, VENOUS BLOOD GAS (G3P V)
Bicarbonate: 22.7 mmol/L (ref 20.0–28.0)
O2 Saturation: 70 %
TCO2: 24 mmol/L (ref 0–100)
pCO2, Ven: 30.5 mmHg — ABNORMAL LOW (ref 44.0–60.0)
pH, Ven: 7.481 — ABNORMAL HIGH (ref 7.250–7.430)
pO2, Ven: 33 mmHg (ref 32.0–45.0)

## 2016-10-27 LAB — BASIC METABOLIC PANEL
ANION GAP: 10 (ref 5–15)
BUN: 23 mg/dL — ABNORMAL HIGH (ref 6–20)
CHLORIDE: 100 mmol/L — AB (ref 101–111)
CO2: 23 mmol/L (ref 22–32)
Calcium: 9.3 mg/dL (ref 8.9–10.3)
Creatinine, Ser: 0.9 mg/dL (ref 0.44–1.00)
GFR calc Af Amer: >60 mL/min (ref >60)
Glucose, Bld: 90 mg/dL (ref 65–99)
POTASSIUM: 3.2 mmol/L — AB (ref 3.5–5.1)
SODIUM: 133 mmol/L — AB (ref 135–145)

## 2016-10-27 LAB — POCT I-STAT 3, ART BLOOD GAS (G3+)
Bicarbonate: 20.6 mmol/L (ref 20.0–28.0)
O2 SAT: 99 %
PO2 ART: 105 mmHg (ref 83.0–108.0)
TCO2: 21 mmol/L (ref 0–100)
pCO2 arterial: 22 mmHg — ABNORMAL LOW (ref 32.0–48.0)
pH, Arterial: 7.579 — ABNORMAL HIGH (ref 7.350–7.450)

## 2016-10-27 SURGERY — RIGHT/LEFT HEART CATH AND CORONARY ANGIOGRAPHY
Anesthesia: LOCAL

## 2016-10-27 MED ORDER — FENTANYL CITRATE (PF) 100 MCG/2ML IJ SOLN
INTRAMUSCULAR | Status: AC
  Filled 2016-10-27: qty 2

## 2016-10-27 MED ORDER — VERAPAMIL HCL 2.5 MG/ML IV SOLN
INTRAVENOUS | Status: AC
  Filled 2016-10-27: qty 2

## 2016-10-27 MED ORDER — ASPIRIN 81 MG PO CHEW
CHEWABLE_TABLET | ORAL | Status: AC
  Administered 2016-10-27: 81 mg via ORAL
  Filled 2016-10-27: qty 1

## 2016-10-27 MED ORDER — SODIUM CHLORIDE 0.9 % IV SOLN: 250.0000 mL | INTRAVENOUS | Status: DC | PRN

## 2016-10-27 MED ORDER — LIDOCAINE HCL (PF) 1 % IJ SOLN
INTRAMUSCULAR | Status: AC
  Filled 2016-10-27: qty 30

## 2016-10-27 MED ORDER — POTASSIUM CHLORIDE CRYS ER 20 MEQ PO TBCR
20.0000 meq | EXTENDED_RELEASE_TABLET | Freq: Once | ORAL | Status: AC
  Administered 2016-10-27: 20 meq via ORAL

## 2016-10-27 MED ORDER — ASPIRIN 81 MG PO CHEW
81.0000 mg | CHEWABLE_TABLET | ORAL | Status: AC
  Administered 2016-10-27: 81 mg via ORAL

## 2016-10-27 MED ORDER — IOPAMIDOL (ISOVUE-370) INJECTION 76%
INTRAVENOUS | Status: AC
  Filled 2016-10-27: qty 100

## 2016-10-27 MED ORDER — VERAPAMIL HCL 2.5 MG/ML IV SOLN
INTRAVENOUS | Status: DC | PRN
  Administered 2016-10-27: 09:00:00 via INTRA_ARTERIAL

## 2016-10-27 MED ORDER — HEPARIN SODIUM (PORCINE) 1000 UNIT/ML IJ SOLN
INTRAMUSCULAR | Status: AC
  Filled 2016-10-27: qty 1

## 2016-10-27 MED ORDER — MIDAZOLAM HCL 2 MG/2ML IJ SOLN
INTRAMUSCULAR | Status: DC | PRN
  Administered 2016-10-27: 1 mg via INTRAVENOUS
  Administered 2016-10-27: 0.5 mg via INTRAVENOUS

## 2016-10-27 MED ORDER — LIDOCAINE HCL (PF) 1 % IJ SOLN
INTRAMUSCULAR | Status: DC | PRN
  Administered 2016-10-27: 5 mL

## 2016-10-27 MED ORDER — SODIUM CHLORIDE 0.9% FLUSH: 3.0000 mL | Freq: Two times a day (BID) | INTRAVENOUS | Status: DC

## 2016-10-27 MED ORDER — HEPARIN (PORCINE) IN NACL 2-0.9 UNIT/ML-% IJ SOLN
INTRAMUSCULAR | Status: AC | PRN
  Administered 2016-10-27: 1000 mL via INTRA_ARTERIAL

## 2016-10-27 MED ORDER — MIDAZOLAM HCL 2 MG/2ML IJ SOLN
INTRAMUSCULAR | Status: AC
Start: 2016-10-27 — End: ?
  Filled 2016-10-27: qty 2

## 2016-10-27 MED ORDER — SODIUM CHLORIDE 0.9 % WEIGHT BASED INFUSION
3.0000 mL/kg/h | INTRAVENOUS | Status: DC
  Administered 2016-10-27: 3 mL/kg/h via INTRAVENOUS

## 2016-10-27 MED ORDER — SODIUM CHLORIDE 0.9% FLUSH: 3.0000 mL | INTRAVENOUS | Status: DC | PRN

## 2016-10-27 MED ORDER — SODIUM CHLORIDE 0.9 % IV SOLN: INTRAVENOUS | Status: AC

## 2016-10-27 MED ORDER — POTASSIUM CHLORIDE CRYS ER 20 MEQ PO TBCR
EXTENDED_RELEASE_TABLET | ORAL | Status: AC
  Filled 2016-10-27: qty 1

## 2016-10-27 MED ORDER — FENTANYL CITRATE (PF) 100 MCG/2ML IJ SOLN
INTRAMUSCULAR | Status: DC | PRN
  Administered 2016-10-27 (×2): 25 ug via INTRAVENOUS

## 2016-10-27 MED ORDER — IOPAMIDOL (ISOVUE-370) INJECTION 76%
INTRAVENOUS | Status: DC | PRN
  Administered 2016-10-27: 80 mL via INTRA_ARTERIAL

## 2016-10-27 MED ORDER — SODIUM CHLORIDE 0.9 % WEIGHT BASED INFUSION: 1.0000 mL/kg/h | INTRAVENOUS | Status: DC

## 2016-10-27 MED ORDER — FENTANYL CITRATE (PF) 100 MCG/2ML IJ SOLN
50.0000 ug | Freq: Once | INTRAMUSCULAR | Status: AC
  Administered 2016-10-27: 50 ug via INTRAVENOUS

## 2016-10-27 MED ORDER — HEPARIN SODIUM (PORCINE) 1000 UNIT/ML IJ SOLN
INTRAMUSCULAR | Status: DC | PRN
  Administered 2016-10-27: 2500 [IU] via INTRAVENOUS

## 2016-10-27 MED ORDER — HEPARIN (PORCINE) IN NACL 2-0.9 UNIT/ML-% IJ SOLN
INTRAMUSCULAR | Status: AC
  Filled 2016-10-27: qty 1000

## 2016-10-27 MED ORDER — NITROGLYCERIN 1 MG/10 ML FOR IR/CATH LAB
INTRA_ARTERIAL | Status: AC
  Filled 2016-10-27: qty 10

## 2016-10-27 SURGICAL SUPPLY — 16 items
CATH BALLN WEDGE 5F 110CM (CATHETERS) ×1 IMPLANT
CATH EXPO 5FR FR4 (CATHETERS) ×1 IMPLANT
CATH INFINITI 5FR ANG PIGTAIL (CATHETERS) ×1 IMPLANT
CATH OPTITORQUE TIG 4.0 5F (CATHETERS) ×1 IMPLANT
DEVICE RAD COMP TR BAND LRG (VASCULAR PRODUCTS) IMPLANT
DEVICE RAD TR BAND REGULAR (VASCULAR PRODUCTS) ×1 IMPLANT
GLIDESHEATH SLEND A-KIT 6F 20G (SHEATH) ×1 IMPLANT
GUIDEWIRE .025 260CM (WIRE) ×1 IMPLANT
GUIDEWIRE INQWIRE 1.5J.035X260 (WIRE) IMPLANT
INQWIRE 1.5J .035X260CM (WIRE) ×2
KIT HEART LEFT (KITS) ×2 IMPLANT
PACK CARDIAC CATHETERIZATION (CUSTOM PROCEDURE TRAY) ×2 IMPLANT
SHEATH GLIDE SLENDER 4/5FR (SHEATH) ×1 IMPLANT
TRANSDUCER W/STOPCOCK (MISCELLANEOUS) ×2 IMPLANT
TUBING CIL FLEX 10 FLL-RA (TUBING) ×2 IMPLANT
WIRE HI TORQ VERSACORE-J 145CM (WIRE) ×1 IMPLANT

## 2016-10-27 NOTE — Discharge Instructions (Signed)

## 2016-10-27 NOTE — Interval H&P Note (Signed)
History and Physical Interval Note:  10/27/2016 9:09 AM  Brittany Whitney  has presented today for surgery, with the diagnosis of aortic regurgetation, pulmonary hypertension  The various methods of treatment have been discussed with the patient and family. After consideration of risks, benefits and other options for treatment, the patient has consented to  Procedure(s): Right/Left Heart Cath and Coronary Angiography (N/A) and possible PCI as a surgical intervention .  The patient's history has been reviewed, patient examined, no change in status, stable for surgery.  I have reviewed the patient's chart and labs.  Questions were answered to the patient's satisfaction.     Adrian Prows

## 2016-10-27 NOTE — Progress Notes (Signed)
Attempt to remove 3cc from tr band and swelling noted proximal to tr band and air reinstilled and pressure dressing placed over swollen area after pressure held

## 2016-10-27 NOTE — Progress Notes (Signed)
Client c/o visual changes and states feels like aura of migraine and seeing "squiggly lines" both eyes and Dr Einar Gip notified and order noted and medication given

## 2016-10-28 MED FILL — Nitroglycerin IV Soln 100 MCG/ML in D5W: INTRA_ARTERIAL | Qty: 10 | Status: AC

## 2016-11-06 DIAGNOSIS — I351 Nonrheumatic aortic (valve) insufficiency: Secondary | ICD-10-CM | POA: Diagnosis not present

## 2016-11-06 DIAGNOSIS — E781 Pure hyperglyceridemia: Secondary | ICD-10-CM | POA: Diagnosis not present

## 2016-11-06 DIAGNOSIS — Z8719 Personal history of other diseases of the digestive system: Secondary | ICD-10-CM | POA: Diagnosis not present

## 2016-11-06 DIAGNOSIS — I77810 Thoracic aortic ectasia: Secondary | ICD-10-CM | POA: Diagnosis not present

## 2016-11-18 DIAGNOSIS — N7011 Chronic salpingitis: Secondary | ICD-10-CM | POA: Diagnosis not present

## 2016-12-09 DIAGNOSIS — N840 Polyp of corpus uteri: Secondary | ICD-10-CM | POA: Diagnosis not present

## 2016-12-09 DIAGNOSIS — N950 Postmenopausal bleeding: Secondary | ICD-10-CM | POA: Diagnosis not present

## 2016-12-14 DIAGNOSIS — E039 Hypothyroidism, unspecified: Secondary | ICD-10-CM | POA: Diagnosis not present

## 2016-12-14 DIAGNOSIS — R002 Palpitations: Secondary | ICD-10-CM | POA: Diagnosis not present

## 2016-12-14 DIAGNOSIS — I493 Ventricular premature depolarization: Secondary | ICD-10-CM | POA: Diagnosis not present

## 2016-12-14 DIAGNOSIS — I351 Nonrheumatic aortic (valve) insufficiency: Secondary | ICD-10-CM | POA: Diagnosis not present

## 2016-12-18 DIAGNOSIS — Z1329 Encounter for screening for other suspected endocrine disorder: Secondary | ICD-10-CM | POA: Diagnosis not present

## 2016-12-18 DIAGNOSIS — Z0000 Encounter for general adult medical examination without abnormal findings: Secondary | ICD-10-CM | POA: Diagnosis not present

## 2016-12-18 DIAGNOSIS — Z13220 Encounter for screening for lipoid disorders: Secondary | ICD-10-CM | POA: Diagnosis not present

## 2016-12-18 DIAGNOSIS — Z114 Encounter for screening for human immunodeficiency virus [HIV]: Secondary | ICD-10-CM | POA: Diagnosis not present

## 2016-12-18 DIAGNOSIS — E559 Vitamin D deficiency, unspecified: Secondary | ICD-10-CM | POA: Diagnosis not present

## 2016-12-22 DIAGNOSIS — R5383 Other fatigue: Secondary | ICD-10-CM | POA: Diagnosis not present

## 2016-12-22 DIAGNOSIS — E039 Hypothyroidism, unspecified: Secondary | ICD-10-CM | POA: Diagnosis not present

## 2016-12-22 DIAGNOSIS — E782 Mixed hyperlipidemia: Secondary | ICD-10-CM | POA: Diagnosis not present

## 2016-12-22 DIAGNOSIS — Z0000 Encounter for general adult medical examination without abnormal findings: Secondary | ICD-10-CM | POA: Diagnosis not present

## 2016-12-22 DIAGNOSIS — Z23 Encounter for immunization: Secondary | ICD-10-CM | POA: Diagnosis not present

## 2016-12-22 DIAGNOSIS — E559 Vitamin D deficiency, unspecified: Secondary | ICD-10-CM | POA: Diagnosis not present

## 2016-12-22 DIAGNOSIS — Z681 Body mass index (BMI) 19 or less, adult: Secondary | ICD-10-CM | POA: Diagnosis not present

## 2016-12-24 DIAGNOSIS — M542 Cervicalgia: Secondary | ICD-10-CM | POA: Diagnosis not present

## 2016-12-24 DIAGNOSIS — M2550 Pain in unspecified joint: Secondary | ICD-10-CM | POA: Diagnosis not present

## 2016-12-24 DIAGNOSIS — M25572 Pain in left ankle and joints of left foot: Secondary | ICD-10-CM | POA: Diagnosis not present

## 2016-12-24 DIAGNOSIS — M79601 Pain in right arm: Secondary | ICD-10-CM | POA: Diagnosis not present

## 2016-12-25 DIAGNOSIS — R0602 Shortness of breath: Secondary | ICD-10-CM | POA: Diagnosis not present

## 2017-01-14 DIAGNOSIS — Z85820 Personal history of malignant melanoma of skin: Secondary | ICD-10-CM | POA: Diagnosis not present

## 2017-01-14 DIAGNOSIS — Z85828 Personal history of other malignant neoplasm of skin: Secondary | ICD-10-CM | POA: Diagnosis not present

## 2017-01-14 DIAGNOSIS — B078 Other viral warts: Secondary | ICD-10-CM | POA: Diagnosis not present

## 2017-01-14 DIAGNOSIS — L821 Other seborrheic keratosis: Secondary | ICD-10-CM | POA: Diagnosis not present

## 2017-01-20 ENCOUNTER — Encounter (HOSPITAL_BASED_OUTPATIENT_CLINIC_OR_DEPARTMENT_OTHER): Payer: Self-pay | Admitting: *Deleted

## 2017-01-20 NOTE — H&P (Signed)
Patient name Brittany Whitney, [place] DICTATION# CSN# [id number]  [facility], MD 01/20/2017 6:04 AM

## 2017-01-20 NOTE — Progress Notes (Signed)
SPOKE W/ PT VIA PHONE.  NPO AFTER MN.  ARRIVE AT 0530.  NEEDS CBC AND BMET.  CURRENT EKG, DR Einar Gip OFFICE NOTE, STRESS TEST, AND HIS CLEARANCE IN CHART.  WILL TAKE AM MEDS DOS W/ SIPS OF WATER.

## 2017-01-20 NOTE — Anesthesia Preprocedure Evaluation (Addendum)
Anesthesia Evaluation  Patient identified by MRN, date of birth, ID band Patient awake    Reviewed: Allergy & Precautions, NPO status , Patient's Chart, lab work & pertinent test results  Airway Mallampati: I  TM Distance: >3 FB Neck ROM: Full    Dental  (+) Teeth Intact, Caps   Pulmonary neg pulmonary ROS,    Pulmonary exam normal breath sounds clear to auscultation       Cardiovascular negative cardio ROS Normal cardiovascular exam+ Valvular Problems/Murmurs AI  Rhythm:Regular Rate:Normal  Moderate-Severe Aortic regurgitation Normal coronaries- Cath 10/2016   Neuro/Psych  Headaches, negative psych ROS   GI/Hepatic Neg liver ROS, Hx/o Crohn's disease   Endo/Other  Hypothyroidism   Renal/GU negative Renal ROS  negative genitourinary   Musculoskeletal  (+) Arthritis , Osteoarthritis,  Hx/o cervical radiculopathy   Abdominal   Peds  Hematology   Anesthesia Other Findings   Reproductive/Obstetrics Endometrial polyp                           Anesthesia Physical Anesthesia Plan  ASA: II  Anesthesia Plan: General   Post-op Pain Management:    Induction: Intravenous  PONV Risk Score and Plan: 4 or greater and Ondansetron, Dexamethasone, Midazolam, Propofol infusion and Treatment may vary due to age or medical condition  Airway Management Planned: LMA  Additional Equipment:   Intra-op Plan:   Post-operative Plan: Extubation in OR  Informed Consent: I have reviewed the patients History and Physical, chart, labs and discussed the procedure including the risks, benefits and alternatives for the proposed anesthesia with the patient or authorized representative who has indicated his/her understanding and acceptance.   Dental advisory given  Plan Discussed with: CRNA, Anesthesiologist and Surgeon  Anesthesia Plan Comments:       Anesthesia Quick Evaluation

## 2017-01-20 NOTE — H&P (Signed)
NAME:  Brittany Whitney, Brittany Whitney NO.:  [id number]  MEDICAL RECORD NO.:  [id number]  LOCATION:                                 FACILITY:  PHYSICIAN:  Darlyn Chamber, M.D.        DATE OF BIRTH:  DATE OF ADMISSION: DATE OF DISCHARGE:                             HISTORY & PHYSICAL   DATE OF SURGERY:  January 21, 2017, at [facility] here in [place].  HISTORY OF PRESENT ILLNESS:  The patient is a 61 year old gravida 3, para 2, postmenopausal patient, who presents for hysteroscopy and MyoSure resection of an endometrial polyp.  The patient is postmenopausal, has been on hormone replacement therapy.  The patient underwent evaluation with an ultrasound that revealed an endometrial thickness.  Subsequent saline infusion ultrasound confirmed the presence of an endometrial polyp.  She has been followed with a left ovarian cyst that has been aspirated in the past and followed by Dr. Alger Memos- Dianah Field.  It basically remains unchanged and her CA-125's had been unremarkable.  ALLERGIES:  She is allergic to PENICILLIN and FLAGYL.  MEDICATIONS: 1. She is on prednisone 5 mg every other day. 2. Doxycycline 20 mg twice a day. 3. She is on Vivelle-Dot 0.025. 4. Prometrium 100 mg q.h.s. 5. She uses testosterone cream. 6. She is on levothyroxine 75 mcg a day. 7. Vascepa __________ twice a day. 8. Uses Ambien as needed. 9. Also Imitrex as needed.  PAST MEDICAL HISTORY:  The patient has a history of ulcerative colitis. Because of this, she underwent a proctocolectomy.  She does have a __________ pouch in place.  She has had numerous revisions of this.  She does remain on prednisone for this.  Had previously been hospitalized for bowel obstruction that did resolve.  As noted also, she had a history of persistent cystic enlargement that was aspirated.  So, she has had numerous hospitalizations and surgeries for the ulcerative colitis.  From a gynecological perspective, she  had a laparoscopy with lysis of adhesions at [facility] in 1991.  Obstetrical history, she has had 1 spontaneous abortion, 2 cesarean sections, and a previous tubal with her last pregnancy.  FAMILY HISTORY:  Noncontributory.  SOCIAL HISTORY:  Noncontributory.  REVIEW OF SYSTEMS:  Noncontributory.  PHYSICAL EXAMINATION:  VITAL SIGNS:  The patient is afebrile with stable vital signs. HEENT:  The patient is normocephalic.  Pupils equal, round, and reactive to light and accommodation.  Extraocular movements were intact.  Sclerae and conjunctivae were clear.  Oropharynx clear. NECK:  Without thyromegaly. BREASTS:  Not examined. LUNGS:  Clear. CARDIOVASCULAR SYSTEM:  Regular rate.  No murmurs or gallops. ABDOMEN:  She has a midline incision.  Does have an ileal pouch on the left side with an ostomy.  There are no other palpable abnormalities. Vaginal mucosa is clear.  Cervix unremarkable.  Uterus normal size, shape, and contour.  Adnexa free of masses or tenderness. EXTREMITIES:  Trace edema. NEUROLOGIC:  Grossly within normal limits.  IMPRESSION: 1. Endometrial polyp. 2. History of ulcerative colitis with numerous operative procedures.  PLAN:  The patient will undergo hysteroscopy along with the resection of the endometrial polyp.  The nature  of the procedure has been discussed and the risks explained.  Risks include the risk of infection.  The risk of hemorrhage that could require transfusion with the risk of AIDS or hepatitis.  Excessive bleeding could require hysterectomy.  There is a risk of perforation with injury to adjacent organs, requiring exploratory surgery.  Risk of deep venous thrombosis and pulmonary embolus.  The patient expressed understanding of the indications and risks.     Darlyn Chamber, M.D.     JSM/MEDQ  D:  01/20/2017  T:  01/20/2017  Job:  [id number]

## 2017-01-21 ENCOUNTER — Encounter (HOSPITAL_BASED_OUTPATIENT_CLINIC_OR_DEPARTMENT_OTHER): Payer: Self-pay | Admitting: *Deleted

## 2017-01-21 ENCOUNTER — Encounter (HOSPITAL_BASED_OUTPATIENT_CLINIC_OR_DEPARTMENT_OTHER)
Admission: RE | Disposition: A | Discharge status: Home / Self Care | Payer: Self-pay | Source: Ambulatory Visit | Attending: Obstetrics and Gynecology

## 2017-01-21 ENCOUNTER — Ambulatory Visit (HOSPITAL_BASED_OUTPATIENT_CLINIC_OR_DEPARTMENT_OTHER): Payer: BLUE CROSS/BLUE SHIELD | Admitting: Anesthesiology

## 2017-01-21 ENCOUNTER — Ambulatory Visit (HOSPITAL_BASED_OUTPATIENT_CLINIC_OR_DEPARTMENT_OTHER)
Admission: RE | Admit: 2017-01-21 | Discharge: 2017-01-21 | Disposition: A | Discharge status: Home / Self Care | Payer: BLUE CROSS/BLUE SHIELD | Source: Ambulatory Visit | Attending: Obstetrics and Gynecology | Admitting: Obstetrics and Gynecology

## 2017-01-21 DIAGNOSIS — M1990 Unspecified osteoarthritis, unspecified site: Secondary | ICD-10-CM | POA: Insufficient documentation

## 2017-01-21 DIAGNOSIS — Z79899 Other long term (current) drug therapy: Secondary | ICD-10-CM | POA: Insufficient documentation

## 2017-01-21 DIAGNOSIS — N840 Polyp of corpus uteri: Secondary | ICD-10-CM | POA: Diagnosis not present

## 2017-01-21 DIAGNOSIS — K5090 Crohn's disease, unspecified, without complications: Secondary | ICD-10-CM | POA: Diagnosis not present

## 2017-01-21 DIAGNOSIS — I351 Nonrheumatic aortic (valve) insufficiency: Secondary | ICD-10-CM | POA: Diagnosis not present

## 2017-01-21 DIAGNOSIS — E039 Hypothyroidism, unspecified: Secondary | ICD-10-CM | POA: Insufficient documentation

## 2017-01-21 HISTORY — DX: Crohn's disease of large intestine without complications: K50.10

## 2017-01-21 HISTORY — DX: Polyp of corpus uteri: N84.0

## 2017-01-21 HISTORY — DX: Migraine, unspecified, not intractable, without status migrainosus: G43.909

## 2017-01-21 HISTORY — DX: Ileostomy status: Z93.2

## 2017-01-21 HISTORY — DX: Personal history of other medical treatment: Z92.89

## 2017-01-21 HISTORY — DX: Hypothyroidism, unspecified: E03.9

## 2017-01-21 HISTORY — DX: Unspecified osteoarthritis, unspecified site: M19.90

## 2017-01-21 HISTORY — DX: Personal history of malignant melanoma of skin: Z85.820

## 2017-01-21 HISTORY — DX: Nonrheumatic aortic (valve) insufficiency: I35.1

## 2017-01-21 HISTORY — PX: DILATATION & CURETTAGE/HYSTEROSCOPY WITH MYOSURE: SHX6511

## 2017-01-21 LAB — CBC
HCT: 35 % — ABNORMAL LOW (ref 36.0–46.0)
Hemoglobin: 12.2 g/dL (ref 12.0–15.0)
MCH: 29.7 pg (ref 26.0–34.0)
MCHC: 34.9 g/dL (ref 30.0–36.0)
MCV: 85.2 fL (ref 78.0–100.0)
PLATELETS: 277 10*3/uL (ref 150–400)
RBC: 4.11 MIL/uL (ref 3.87–5.11)
RDW: 14 % (ref 11.5–15.5)
WBC: 7.2 10*3/uL (ref 4.0–10.5)

## 2017-01-21 LAB — BASIC METABOLIC PANEL
ANION GAP: 12 (ref 5–15)
BUN: 34 mg/dL — ABNORMAL HIGH (ref 6–20)
CHLORIDE: 101 mmol/L (ref 101–111)
CO2: 20 mmol/L — ABNORMAL LOW (ref 22–32)
Calcium: 9.5 mg/dL (ref 8.9–10.3)
Creatinine, Ser: 0.95 mg/dL (ref 0.44–1.00)
Glucose, Bld: 84 mg/dL (ref 65–99)
POTASSIUM: 3.6 mmol/L (ref 3.5–5.1)
SODIUM: 133 mmol/L — AB (ref 135–145)

## 2017-01-21 SURGERY — DILATATION & CURETTAGE/HYSTEROSCOPY WITH MYOSURE
Anesthesia: General | Site: Uterus

## 2017-01-21 MED ORDER — MEPERIDINE HCL 25 MG/ML IJ SOLN
6.2500 mg | INTRAMUSCULAR | Status: DC | PRN
  Filled 2017-01-21: qty 1

## 2017-01-21 MED ORDER — DEXAMETHASONE SODIUM PHOSPHATE 10 MG/ML IJ SOLN
INTRAMUSCULAR | Status: AC
  Filled 2017-01-21: qty 1

## 2017-01-21 MED ORDER — METOCLOPRAMIDE HCL 5 MG/ML IJ SOLN
10.0000 mg | Freq: Once | INTRAMUSCULAR | Status: DC | PRN
  Filled 2017-01-21: qty 2

## 2017-01-21 MED ORDER — FENTANYL CITRATE (PF) 100 MCG/2ML IJ SOLN
INTRAMUSCULAR | Status: AC
  Filled 2017-01-21: qty 2

## 2017-01-21 MED ORDER — LIDOCAINE HCL 1 % IJ SOLN
INTRAMUSCULAR | Status: DC | PRN
  Administered 2017-01-21: 1 mL

## 2017-01-21 MED ORDER — MIDAZOLAM HCL 2 MG/2ML IJ SOLN
INTRAMUSCULAR | Status: AC
  Filled 2017-01-21: qty 2

## 2017-01-21 MED ORDER — FENTANYL CITRATE (PF) 100 MCG/2ML IJ SOLN
INTRAMUSCULAR | Status: DC | PRN
  Administered 2017-01-21 (×4): 25 ug via INTRAVENOUS

## 2017-01-21 MED ORDER — PROPOFOL 10 MG/ML IV BOLUS
INTRAVENOUS | Status: DC | PRN
  Administered 2017-01-21: 160 mg via INTRAVENOUS

## 2017-01-21 MED ORDER — LIDOCAINE 2% (20 MG/ML) 5 ML SYRINGE
INTRAMUSCULAR | Status: AC
  Filled 2017-01-21: qty 5

## 2017-01-21 MED ORDER — DEXAMETHASONE SODIUM PHOSPHATE 4 MG/ML IJ SOLN
INTRAMUSCULAR | Status: DC | PRN
  Administered 2017-01-21: 10 mg via INTRAVENOUS

## 2017-01-21 MED ORDER — FENTANYL CITRATE (PF) 100 MCG/2ML IJ SOLN
25.0000 ug | INTRAMUSCULAR | Status: DC | PRN
  Filled 2017-01-21: qty 1

## 2017-01-21 MED ORDER — ONDANSETRON HCL 4 MG/2ML IJ SOLN
INTRAMUSCULAR | Status: AC
  Filled 2017-01-21: qty 2

## 2017-01-21 MED ORDER — CIPROFLOXACIN IN D5W 400 MG/200ML IV SOLN
400.0000 mg | Freq: Once | INTRAVENOUS | Status: AC
  Administered 2017-01-21: 400 mg via INTRAVENOUS
  Filled 2017-01-21: qty 200

## 2017-01-21 MED ORDER — PROPOFOL 10 MG/ML IV BOLUS
INTRAVENOUS | Status: AC
  Filled 2017-01-21: qty 40

## 2017-01-21 MED ORDER — KETOROLAC TROMETHAMINE 30 MG/ML IJ SOLN
INTRAMUSCULAR | Status: DC | PRN
  Administered 2017-01-21: 30 mg via INTRAVENOUS

## 2017-01-21 MED ORDER — LACTATED RINGERS IV SOLN
INTRAVENOUS | Status: DC
  Administered 2017-01-21: 06:00:00 via INTRAVENOUS
  Filled 2017-01-21: qty 1000

## 2017-01-21 MED ORDER — ONDANSETRON HCL 4 MG/2ML IJ SOLN
INTRAMUSCULAR | Status: DC | PRN
  Administered 2017-01-21: 4 mg via INTRAVENOUS

## 2017-01-21 MED ORDER — MIDAZOLAM HCL 5 MG/5ML IJ SOLN
INTRAMUSCULAR | Status: DC | PRN
  Administered 2017-01-21: 2 mg via INTRAVENOUS

## 2017-01-21 MED ORDER — CIPROFLOXACIN IN D5W 400 MG/200ML IV SOLN
INTRAVENOUS | Status: AC
  Filled 2017-01-21: qty 200

## 2017-01-21 MED ORDER — OXYCODONE-ACETAMINOPHEN 7.5-325 MG PO TABS: 1.0000 | ORAL_TABLET | Freq: Four times a day (QID) | ORAL | 0 refills | Status: DC | PRN

## 2017-01-21 MED ORDER — HYDROCODONE-ACETAMINOPHEN 7.5-325 MG PO TABS
1.0000 | ORAL_TABLET | Freq: Once | ORAL | Status: DC | PRN
  Filled 2017-01-21: qty 1

## 2017-01-21 MED ORDER — LIDOCAINE HCL (CARDIAC) 20 MG/ML IV SOLN
INTRAVENOUS | Status: DC | PRN
  Administered 2017-01-21: 80 mg via INTRAVENOUS

## 2017-01-21 SURGICAL SUPPLY — 24 items
CANISTER SUCT 3000ML PPV (MISCELLANEOUS) ×4 IMPLANT
CATH ROBINSON RED A/P 16FR (CATHETERS) ×1 IMPLANT
CLOTH BEACON ORANGE TIMEOUT ST (SAFETY) ×2 IMPLANT
COUNTER NEEDLE 1200 MAGNETIC (NEEDLE) ×2 IMPLANT
DEVICE MYOSURE LITE (MISCELLANEOUS) ×1 IMPLANT
DEVICE MYOSURE REACH (MISCELLANEOUS) IMPLANT
DILATOR CANAL MILEX (MISCELLANEOUS) IMPLANT
FILTER ARTHROSCOPY CONVERTOR (FILTER) ×2 IMPLANT
GLOVE BIO SURGEON STRL SZ7 (GLOVE) ×4 IMPLANT
GOWN STRL REUS W/TWL XL LVL3 (GOWN DISPOSABLE) ×2 IMPLANT
IV NS IRRIG 3000ML ARTHROMATIC (IV SOLUTION) ×2 IMPLANT
KIT RM TURNOVER CYSTO AR (KITS) ×2 IMPLANT
MYOSURE XL FIBROID REM (MISCELLANEOUS)
NDL SPNL 18GX3.5 QUINCKE PK (NEEDLE) ×1 IMPLANT
NEEDLE SPNL 18GX3.5 QUINCKE PK (NEEDLE) ×2 IMPLANT
PACK VAGINAL MINOR WOMEN LF (CUSTOM PROCEDURE TRAY) ×2 IMPLANT
PAD OB MATERNITY 4.3X12.25 (PERSONAL CARE ITEMS) ×2 IMPLANT
PAD PREP 24X48 CUFFED NSTRL (MISCELLANEOUS) ×2 IMPLANT
SEAL ROD LENS SCOPE MYOSURE (ABLATOR) ×2 IMPLANT
SYSTEM TISS REMOVAL MYSR XL RM (MISCELLANEOUS) IMPLANT
TOWEL OR 17X24 6PK STRL BLUE (TOWEL DISPOSABLE) ×4 IMPLANT
TUBING AQUILEX INFLOW (TUBING) ×2 IMPLANT
TUBING AQUILEX OUTFLOW (TUBING) ×2 IMPLANT
WATER STERILE IRR 500ML POUR (IV SOLUTION) ×2 IMPLANT

## 2017-01-21 NOTE — Anesthesia Procedure Notes (Signed)
Procedure Name: LMA Insertion Date/Time: 01/21/2017 7:30 AM Performed by: Justice Rocher Pre-anesthesia Checklist: Patient identified, Emergency Drugs available, Suction available and Patient being monitored Patient Re-evaluated:Patient Re-evaluated prior to induction Oxygen Delivery Method: Circle system utilized Preoxygenation: Pre-oxygenation with 100% oxygen Induction Type: IV induction Ventilation: Mask ventilation without difficulty LMA: LMA inserted LMA Size: 4.0 Number of attempts: 1 Airway Equipment and Method: Bite block Placement Confirmation: positive ETCO2 and breath sounds checked- equal and bilateral Tube secured with: Tape Dental Injury: Teeth and Oropharynx as per pre-operative assessment

## 2017-01-21 NOTE — Brief Op Note (Signed)
Patient name Brittany Whitney, [place] DICTATION# [id number] CSN# [id number]  [facility], MD 01/21/2017 7:50 AM

## 2017-01-21 NOTE — Discharge Instructions (Signed)
° ° °  D & C Home care Instructions:   Personal hygiene:  Used sanitary napkins for vaginal drainage not tampons. Shower or tub bathe the day after your procedure. No douching until bleeding stops. Always wipe from front to back after  Elimination.  Activity: Do not drive or operate any equipment today. The effects of the anesthesia are still present and drowsiness may result. Rest today, not necessarily flat bed rest, just take it easy. You may resume your normal activity in one to 2 days.  Sexual activity: No intercourse for one week or as indicated by your physician  Diet: Eat a light diet as desired this evening. You may resume a regular diet tomorrow.  Return to work: One to 2 days.  General Expectations of your surgery: Vaginal bleeding should be no heavier than a normal period. Spotting may continue up to 10 days. Mild cramps may continue for a couple of days. You may have a regular period in 2-6 weeks.  Unexpected observations call your doctor if these occur: persistent or heavy bleeding. Severe abdominal cramping or pain. Elevation of temperature greater than 100F.  Call for an appointment in one week.        Activity: Get plenty of rest for the remainder of the day. A responsible individual must stay with you for 24 hours following the procedure.  For the next 24 hours, DO NOT: -Drive a car -Paediatric nurse -Drink alcoholic beverages -Take any medication unless instructed by your physician -Make any legal decisions or sign important papers.  Meals: Start with liquid foods such as gelatin or soup. Progress to regular foods as tolerated. Avoid greasy, spicy, heavy foods. If nausea and/or vomiting occur, drink only clear liquids until the nausea and/or vomiting subsides. Call your physician if vomiting continues.  Special Instructions/Symptoms: Your throat may feel dry or sore from the anesthesia or the breathing tube placed in your throat during surgery. If this causes  discomfort, gargle with warm salt water. The discomfort should disappear within 24 hours.  If you had a scopolamine patch placed behind your ear for the management of post- operative nausea and/or vomiting:  1. The medication in the patch is effective for 72 hours, after which it should be removed.  Wrap patch in a tissue and discard in the trash. Wash hands thoroughly with soap and water. 2. You may remove the patch earlier than 72 hours if you experience unpleasant side effects which may include dry mouth, dizziness or visual disturbances. 3. Avoid touching the patch. Wash your hands with soap and water after contact with the patch.

## 2017-01-21 NOTE — Anesthesia Postprocedure Evaluation (Signed)
Anesthesia Post Note  Patient: Brittany Whitney  Procedure(s) Performed: DILATATION & CURETTAGE/HYSTEROSCOPY WITH MYOSURE (N/A Uterus)     Patient location during evaluation: PACU Anesthesia Type: General Level of consciousness: awake and alert and oriented Pain management: pain level controlled Vital Signs Assessment: post-procedure vital signs reviewed and stable Respiratory status: spontaneous breathing, nonlabored ventilation and respiratory function stable Cardiovascular status: blood pressure returned to baseline and stable Postop Assessment: no apparent nausea or vomiting Anesthetic complications: no    Last Vitals:  Vitals:   01/21/17 0830 01/21/17 0858  BP: (!) 108/54   Pulse: 69 68  Resp: 14 18  Temp:    SpO2: 99% 100%    Last Pain:  Vitals:   01/21/17 0556  TempSrc: Oral                 Masiya Claassen A.

## 2017-01-21 NOTE — Transfer of Care (Signed)
Immediate Anesthesia Transfer of Care Note  Patient: Brittany Whitney  Procedure(s) Performed: Procedure(s) (LRB): DILATATION & CURETTAGE/HYSTEROSCOPY WITH MYOSURE (N/A)  Patient Location: PACU  Anesthesia Type: General  Level of Consciousness: awake, sedated, patient cooperative and responds to stimulation  Airway & Oxygen Therapy: Patient Spontanous Breathing and Patient connected to [place] O2  Post-op Assessment: Report given to PACU RN, Post -op Vital signs reviewed and stable and Patient moving all extremities  Post vital signs: Reviewed and stable  Complications: No apparent anesthesia complications

## 2017-01-21 NOTE — H&P (Signed)
  History and physical exam unchanged 

## 2017-01-21 NOTE — Op Note (Signed)
NAME:  Brittany Whitney, Brittany Whitney NO.:  [id number]  MEDICAL RECORD NO.:  [id number]  LOCATION:                                 FACILITY:  PHYSICIAN:  Darlyn Chamber, M.D.        DATE OF BIRTH:  DATE OF PROCEDURE:  01/21/2017 DATE OF DISCHARGE:                              OPERATIVE REPORT   PREOPERATIVE DIAGNOSIS:  Endometrial polyp.  POSTOPERATIVE DIAGNOSIS:  Endometrial polyp.  OPERATIVE PROCEDURES:  Paracervical block.  Cervical dilation. Hysteroscopy with MyoSure resection of endometrial polyp.  Endometrial curetting.  SURGEON:  Darlyn Chamber, M.D.  ANESTHESIA:  General, paracervical block.  BLOOD LOSS:  Minimal.  PACKS AND DRAINS:  None.  INTRAOPERATIVE BLOOD PLACED:  None.  COMPLICATIONS:  None.  INDICATION:  As previously dictated.  DESCRIPTION OF PROCEDURE:  The patient was taken to the OR and placed in supine position.  After satisfactory level of general anesthesia was obtained, the patient was placed in dorsal lithotomy position using the Allen stirrups.  The perineum and vagina were prepped out with Betadine. Bladder was emptied by in-and-out catheterization.  The patient was draped in sterile field.  Speculum was placed in vaginal vault.  The cervix was grasped with single-tooth tenaculum.  Uterus sounded approximately 7 cm.  Cervix was serially dilated.  Hysteroscope was introduced.  Intrauterine cavity was distended using saline. Visualization revealed a small polyp-like outgrowth coming from the left uterine wall.  The lite MyoSure was brought in place.  This area was completely resected.  We had no signs of perforation.  Total deficit was 30 mL.  Hysteroscope was then removed.  Endometrial curettings were obtained.  Single-tooth tenaculum was removed.  The patient was taken out of the dorsal lithotomy position.  Once alert and extubated, transferred to recovery in good condition.  Sponge, instrument, and needle count was correct by  circulating nurse.     Darlyn Chamber, M.D.     JSM/MEDQ  D:  01/21/2017  T:  01/21/2017  Job:  [id number]

## 2017-01-25 ENCOUNTER — Encounter (HOSPITAL_BASED_OUTPATIENT_CLINIC_OR_DEPARTMENT_OTHER): Payer: Self-pay | Admitting: Obstetrics and Gynecology

## 2017-02-09 ENCOUNTER — Other Ambulatory Visit: Payer: Self-pay | Admitting: *Deleted

## 2017-02-09 MED ORDER — TRAMADOL HCL 50 MG PO TABS: ORAL_TABLET | ORAL | 2 refills | Status: DC

## 2017-02-12 DIAGNOSIS — Z932 Ileostomy status: Secondary | ICD-10-CM | POA: Diagnosis not present

## 2017-02-12 DIAGNOSIS — K5090 Crohn's disease, unspecified, without complications: Secondary | ICD-10-CM | POA: Diagnosis not present

## 2017-02-23 DIAGNOSIS — E039 Hypothyroidism, unspecified: Secondary | ICD-10-CM | POA: Diagnosis not present

## 2017-02-25 DIAGNOSIS — Z681 Body mass index (BMI) 19 or less, adult: Secondary | ICD-10-CM | POA: Diagnosis not present

## 2017-02-25 DIAGNOSIS — Z23 Encounter for immunization: Secondary | ICD-10-CM | POA: Diagnosis not present

## 2017-02-25 DIAGNOSIS — G43909 Migraine, unspecified, not intractable, without status migrainosus: Secondary | ICD-10-CM | POA: Diagnosis not present

## 2017-02-25 DIAGNOSIS — K5090 Crohn's disease, unspecified, without complications: Secondary | ICD-10-CM | POA: Diagnosis not present

## 2017-02-25 DIAGNOSIS — E039 Hypothyroidism, unspecified: Secondary | ICD-10-CM | POA: Diagnosis not present

## 2017-03-16 DIAGNOSIS — L719 Rosacea, unspecified: Secondary | ICD-10-CM | POA: Diagnosis not present

## 2017-03-16 DIAGNOSIS — H15001 Unspecified scleritis, right eye: Secondary | ICD-10-CM | POA: Diagnosis not present

## 2017-03-17 ENCOUNTER — Encounter: Payer: Self-pay | Admitting: Family Medicine

## 2017-03-17 ENCOUNTER — Ambulatory Visit (INDEPENDENT_AMBULATORY_CARE_PROVIDER_SITE_OTHER): Payer: BLUE CROSS/BLUE SHIELD | Admitting: Family Medicine

## 2017-03-17 VITALS — BP 110/60 | Ht 67.0 in | Wt 123.0 lb

## 2017-03-17 DIAGNOSIS — M7711 Lateral epicondylitis, right elbow: Secondary | ICD-10-CM

## 2017-03-17 DIAGNOSIS — M7582 Other shoulder lesions, left shoulder: Secondary | ICD-10-CM

## 2017-03-17 NOTE — Progress Notes (Signed)
   Subjective:    Patient ID: Brittany Whitney, female    DOB: 10-04-1955, 61 y.o.   MRN: [id number]  HPI  61 yo female with PMH  presents with one week of worsening L shoulder pain and R elbow pain over the last week.  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  She denies any new trauma to the area.  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  She reports a sharp pain mainly with external rotation of the arm and when reaching behind her back.  This makes it difficult to take off and put on her pants.      Past Medical History:  Diagnosis Date  . Arthritis   . [place]  . Endometrial polyp   . History of exercise stress test 12-25-2016  by dr gaEinar Gip no evidence ischemia, exercise tolerence excellent, no significant arrhythmias  . History of malignant melanoma of skin    2005  post excision back area-- localized  . Hypothyroidism   [facility]   for crohn's colitis -- external pouch  . Migraines   . Severe aortic valve regurgitation cardiologist-  dr gaEinar Gip  dx by echo 2017   echo 06/ 10/2016 no change except noted mod. to sev. pulmonry htn/  verified by cardiac cath 07/ 2018 showed no evidence pulmonary htn  . Vitamin D deficiency   Lateral epicondylitis Carpal tunnel in right wrist s/p surgical release  Review of Systems As per hpi    Objective:   Physical Exam  Constitutional: She appears well-developed and well-nourished.  HENT:  Head: Normocephalic and atraumatic.  Musculoskeletal:  R and L shoulders symmetrical and not tender to palpation.  Pt without tenderness or stepoff on examination of cervical spine.   L shoulder: Pt was empty can and hawkins positive.  She had pain with external rotation actively and against  resistance but none passively.  She was slow to perform neers but denied pain.    R lateral epicondyle: pt had point tenderness to palpation at insertion.  She had pain with pronation and supination but worse with pronation of R arm.    Skin: No rash noted. No erythema.  Vitals reviewed.  Clinic U/S results  L shoulder:   Bursal sided hypoechoic region of the supraspinatus suggestive of partial tear  R lateral epicondyle: no abnormalities noted      Assessment & Plan:   Rotator Cuff partial tear: pt with exam and ultrasound suggestive of partial rotator cuff tear likely in the supraspinatus.   -pt would like to try conservative management for now not interested in steroid injection at this time -Is on good medicines for her pain tramadol and mobic, will add topical therapy of her choice capsaicin vs aspercreme vs icy hot -Pt given PT exercises to perform at home

## 2017-04-06 ENCOUNTER — Ambulatory Visit: Payer: BLUE CROSS/BLUE SHIELD | Admitting: Sports Medicine

## 2017-04-16 DIAGNOSIS — Z932 Ileostomy status: Secondary | ICD-10-CM | POA: Diagnosis not present

## 2017-04-16 DIAGNOSIS — K5090 Crohn's disease, unspecified, without complications: Secondary | ICD-10-CM | POA: Diagnosis not present

## 2017-04-27 DIAGNOSIS — I351 Nonrheumatic aortic (valve) insufficiency: Secondary | ICD-10-CM | POA: Diagnosis not present

## 2017-04-27 DIAGNOSIS — E781 Pure hyperglyceridemia: Secondary | ICD-10-CM | POA: Diagnosis not present

## 2017-04-27 DIAGNOSIS — Z136 Encounter for screening for cardiovascular disorders: Secondary | ICD-10-CM | POA: Diagnosis not present

## 2017-05-04 DIAGNOSIS — I493 Ventricular premature depolarization: Secondary | ICD-10-CM | POA: Diagnosis not present

## 2017-05-04 DIAGNOSIS — R002 Palpitations: Secondary | ICD-10-CM | POA: Diagnosis not present

## 2017-05-04 DIAGNOSIS — I77810 Thoracic aortic ectasia: Secondary | ICD-10-CM | POA: Diagnosis not present

## 2017-05-04 DIAGNOSIS — I351 Nonrheumatic aortic (valve) insufficiency: Secondary | ICD-10-CM | POA: Diagnosis not present

## 2017-05-25 DIAGNOSIS — E559 Vitamin D deficiency, unspecified: Secondary | ICD-10-CM | POA: Diagnosis not present

## 2017-05-25 DIAGNOSIS — M130 Polyarthritis, unspecified: Secondary | ICD-10-CM | POA: Diagnosis not present

## 2017-05-25 DIAGNOSIS — K50012 Crohn's disease of small intestine with intestinal obstruction: Secondary | ICD-10-CM | POA: Diagnosis not present

## 2017-05-27 DIAGNOSIS — Z681 Body mass index (BMI) 19 or less, adult: Secondary | ICD-10-CM | POA: Diagnosis not present

## 2017-05-27 DIAGNOSIS — Z13820 Encounter for screening for osteoporosis: Secondary | ICD-10-CM | POA: Diagnosis not present

## 2017-05-27 DIAGNOSIS — Z01419 Encounter for gynecological examination (general) (routine) without abnormal findings: Secondary | ICD-10-CM | POA: Diagnosis not present

## 2017-05-27 DIAGNOSIS — M79641 Pain in right hand: Secondary | ICD-10-CM | POA: Diagnosis not present

## 2017-05-31 DIAGNOSIS — I77810 Thoracic aortic ectasia: Secondary | ICD-10-CM | POA: Diagnosis not present

## 2017-05-31 DIAGNOSIS — I351 Nonrheumatic aortic (valve) insufficiency: Secondary | ICD-10-CM | POA: Diagnosis not present

## 2017-05-31 DIAGNOSIS — R9431 Abnormal electrocardiogram [ECG] [EKG]: Secondary | ICD-10-CM | POA: Diagnosis not present

## 2017-05-31 DIAGNOSIS — I359 Nonrheumatic aortic valve disorder, unspecified: Secondary | ICD-10-CM | POA: Diagnosis not present

## 2017-05-31 DIAGNOSIS — E039 Hypothyroidism, unspecified: Secondary | ICD-10-CM | POA: Diagnosis not present

## 2017-05-31 DIAGNOSIS — I517 Cardiomegaly: Secondary | ICD-10-CM | POA: Diagnosis not present

## 2017-05-31 DIAGNOSIS — E782 Mixed hyperlipidemia: Secondary | ICD-10-CM | POA: Diagnosis not present

## 2017-06-04 DIAGNOSIS — K523 Indeterminate colitis: Secondary | ICD-10-CM | POA: Diagnosis not present

## 2017-06-09 DIAGNOSIS — M542 Cervicalgia: Secondary | ICD-10-CM | POA: Diagnosis not present

## 2017-06-09 DIAGNOSIS — M25572 Pain in left ankle and joints of left foot: Secondary | ICD-10-CM | POA: Diagnosis not present

## 2017-06-09 DIAGNOSIS — M2550 Pain in unspecified joint: Secondary | ICD-10-CM | POA: Diagnosis not present

## 2017-06-09 DIAGNOSIS — M79601 Pain in right arm: Secondary | ICD-10-CM | POA: Diagnosis not present

## 2017-06-10 DIAGNOSIS — N83299 Other ovarian cyst, unspecified side: Secondary | ICD-10-CM | POA: Diagnosis not present

## 2017-06-23 DIAGNOSIS — Z932 Ileostomy status: Secondary | ICD-10-CM | POA: Diagnosis not present

## 2017-06-23 DIAGNOSIS — K5090 Crohn's disease, unspecified, without complications: Secondary | ICD-10-CM | POA: Diagnosis not present

## 2017-06-29 ENCOUNTER — Ambulatory Visit (INDEPENDENT_AMBULATORY_CARE_PROVIDER_SITE_OTHER): Payer: BLUE CROSS/BLUE SHIELD | Admitting: Sports Medicine

## 2017-06-29 ENCOUNTER — Encounter: Payer: Self-pay | Admitting: Sports Medicine

## 2017-06-29 DIAGNOSIS — M25571 Pain in right ankle and joints of right foot: Secondary | ICD-10-CM | POA: Diagnosis not present

## 2017-06-29 DIAGNOSIS — M79671 Pain in right foot: Secondary | ICD-10-CM | POA: Diagnosis not present

## 2017-06-29 NOTE — Assessment & Plan Note (Signed)
Currently with a new pain pattern  This seems non traumatic by HX and question if related to autoimmune/ Crohn's changes  Walking gait is obviously antalgic and this is making the pain worse  Trial of sports indole with MT support Try to walk more normally Find show with comfortable forefoot support for any walking

## 2017-06-29 NOTE — Progress Notes (Signed)
Chief complaint: Foot pain times 1 month  History of present illness: Brittany Whitney is a 62 year old female presents to the sports medicine office today with chief complaint of right foot pain and swelling.  I last saw her here in the office back at the end of November for left shoulder pain.  Fortunately, her left shoulder pain has completely resolved.  Unfortunately, the new issue for her today is right foot pain.  She does have known history of inflammatory bowel disease, specifically having Crohn's disease.  She is status post partial colectomy.  She reports that her inflammatory markers have been normal previously.  She is also been found to have a bicuspid aortic valve, a 4.2 mm thoracic aortic aneurysm, for which she will be having aortic valve surgery up at the [facility] clinic in [place] next month.  When symptoms first started about a month ago, she touch base with her GI physician, who felt that symptoms were related to extraintestinal manifestations of Crohn's disease.  Her GI physician felt that she would need to be on Humira, however, given that she is going to be having surgery next month this is not able to be done.   She also saw rheumatology and CRP and ESR OK so he favored conservative follow up.   For pain currently, she is on meloxicam and tramadol.  She reports that this is not helping her.  She reports feeling pain and specifically swelling in her right second toe.  She reports that she has to offload and put more pressure on the outside of her right foot.  Subsequently, she is starting to develop proximal IT band and right-sided hip pain.  She is not report of any numbness, tingling, burning paresthesias.  She wants to know other modalities that she can do to help with the pain and not getting worsening right-sided hip and IT band pain.  Review of systems:  As stated above  Interval past medical history, surgical history, family history, and social history obtained and unchanged.  Medical  history is notable for Crohn's disease, cuspid aortic valve, thoracic aortic aneurysm, thyroidism, history of malignant melanoma, migraines, vitamin D deficiency, surgical history notable for D&C with hysteroscopy, thoracic aortogram, right and left heart catheterization and coronary angiography, colectomy with ileostomy placement, bunionectomy, breast biopsy, abdominal exploration surgery, tubal ligation, lipoma removal x2, she is not report of any current tobacco use, she is not report of any notable family history of any medical problems, allergies and medications are reviewed and are reflected in EMR  Physical exam: Vital signs are reviewed and are documented in the chart Gen.: Alert, oriented, appears stated age, in no apparent distress HEENT: Moist oral mucosa Respiratory: Normal respirations, able to speak in full sentences Cardiac: Regular rate, distal pulses 2+ Integumentary: No rashes on visible skin:  Neurologic: Strength 5/5, sensation 2+ in bilateral lower extremities Psych: Normal affect, mood is described as good Musculoskeletal: Flexion of right foot reveals that she does have slight swelling in the forefoot as well as notable swelling involving her right second toe consistent with dactylitis, white warmth and erythema is noted in this area as well, no ecchymosis noted, it is blanchable, it is tender to palpation on the dorsal and plantar aspect of the metatarsal head, she does have full right ankle and foot range of motion and strength, on gait evaluation she does supinate and put more pressure on the lateral side of her right foot  Assessment and plan: 1.  Right foot pain, suspect secondary  to dactylitis developing metatarsalgia 2.  History of Crohn's disease, status post colectomy and ileostomy placement 3.  History of bicuspid aortic valve and thoracic aortic aneurysm, will be having aortic valve surgery up at the [facility] clinic next month  Plan: Unfortunately, medication  options are limited today due to her planned surgery up at the [facility] clinic next months for cardiothoracic surgery.  She is on the right medications with meloxicam and tramadol.  Obviously she will have to stop the meloxicam 5 days prior to surgery.  Discussed to have her call up at [facility] clinic to see what can be done regarding tramadol use up until the night before surgery.    In regards to nonmedication options, did have her placed in temporary green insole orthotics with metatarsal pad, small size, applied on the right side.  This was fitted to her comfort.  For this will offload the pressure for her.  We will keep things open ended for right now and see how she does with this alone.  If she would like to have custom orthotics in the future, this could be made for her.  Discussed if changes need to be made to her temporary orthotics, she is welcome to return for reevaluation of this.  Otherwise, we will keep things open ended to have her follow-up on as-needed basis.   Mort Sawyers, M.D. [place] observed and examined the patient with the [facility] Fellow and agree with assessment and plan.  Note reviewed and modified by me. Stefanie Libel, MD

## 2017-07-01 DIAGNOSIS — E039 Hypothyroidism, unspecified: Secondary | ICD-10-CM | POA: Diagnosis not present

## 2017-07-01 DIAGNOSIS — E559 Vitamin D deficiency, unspecified: Secondary | ICD-10-CM | POA: Diagnosis not present

## 2017-07-06 DIAGNOSIS — R5383 Other fatigue: Secondary | ICD-10-CM | POA: Diagnosis not present

## 2017-07-06 DIAGNOSIS — R011 Cardiac murmur, unspecified: Secondary | ICD-10-CM | POA: Diagnosis not present

## 2017-07-06 DIAGNOSIS — E559 Vitamin D deficiency, unspecified: Secondary | ICD-10-CM | POA: Diagnosis not present

## 2017-07-06 DIAGNOSIS — E039 Hypothyroidism, unspecified: Secondary | ICD-10-CM | POA: Diagnosis not present

## 2017-07-15 DIAGNOSIS — L720 Epidermal cyst: Secondary | ICD-10-CM | POA: Diagnosis not present

## 2017-07-15 DIAGNOSIS — D2371 Other benign neoplasm of skin of right lower limb, including hip: Secondary | ICD-10-CM | POA: Diagnosis not present

## 2017-07-15 DIAGNOSIS — L02414 Cutaneous abscess of left upper limb: Secondary | ICD-10-CM | POA: Diagnosis not present

## 2017-07-15 DIAGNOSIS — Z85828 Personal history of other malignant neoplasm of skin: Secondary | ICD-10-CM | POA: Diagnosis not present

## 2017-07-15 DIAGNOSIS — L821 Other seborrheic keratosis: Secondary | ICD-10-CM | POA: Diagnosis not present

## 2017-07-15 DIAGNOSIS — Z85820 Personal history of malignant melanoma of skin: Secondary | ICD-10-CM | POA: Diagnosis not present

## 2017-07-20 DIAGNOSIS — M79601 Pain in right arm: Secondary | ICD-10-CM | POA: Diagnosis not present

## 2017-07-20 DIAGNOSIS — M542 Cervicalgia: Secondary | ICD-10-CM | POA: Diagnosis not present

## 2017-07-20 DIAGNOSIS — M79671 Pain in right foot: Secondary | ICD-10-CM | POA: Diagnosis not present

## 2017-07-20 DIAGNOSIS — M2550 Pain in unspecified joint: Secondary | ICD-10-CM | POA: Diagnosis not present

## 2017-07-20 DIAGNOSIS — M25572 Pain in left ankle and joints of left foot: Secondary | ICD-10-CM | POA: Diagnosis not present

## 2017-07-20 DIAGNOSIS — Z932 Ileostomy status: Secondary | ICD-10-CM | POA: Diagnosis not present

## 2017-07-20 DIAGNOSIS — K5090 Crohn's disease, unspecified, without complications: Secondary | ICD-10-CM | POA: Diagnosis not present

## 2017-07-22 DIAGNOSIS — Z932 Ileostomy status: Secondary | ICD-10-CM | POA: Diagnosis not present

## 2017-07-22 DIAGNOSIS — K5090 Crohn's disease, unspecified, without complications: Secondary | ICD-10-CM | POA: Diagnosis not present

## 2017-07-30 ENCOUNTER — Other Ambulatory Visit: Payer: Self-pay | Admitting: Obstetrics and Gynecology

## 2017-07-30 DIAGNOSIS — Z1231 Encounter for screening mammogram for malignant neoplasm of breast: Secondary | ICD-10-CM

## 2017-08-01 ENCOUNTER — Other Ambulatory Visit: Payer: Self-pay | Admitting: Sports Medicine

## 2017-08-02 ENCOUNTER — Other Ambulatory Visit: Payer: Self-pay

## 2017-08-09 DIAGNOSIS — G43719 Chronic migraine without aura, intractable, without status migrainosus: Secondary | ICD-10-CM | POA: Diagnosis not present

## 2017-08-09 DIAGNOSIS — R0600 Dyspnea, unspecified: Secondary | ICD-10-CM | POA: Diagnosis not present

## 2017-08-09 DIAGNOSIS — E861 Hypovolemia: Secondary | ICD-10-CM | POA: Diagnosis not present

## 2017-08-09 DIAGNOSIS — E785 Hyperlipidemia, unspecified: Secondary | ICD-10-CM | POA: Diagnosis not present

## 2017-08-09 DIAGNOSIS — J90 Pleural effusion, not elsewhere classified: Secondary | ICD-10-CM | POA: Diagnosis not present

## 2017-08-09 DIAGNOSIS — I371 Nonrheumatic pulmonary valve insufficiency: Secondary | ICD-10-CM | POA: Diagnosis not present

## 2017-08-09 DIAGNOSIS — I083 Combined rheumatic disorders of mitral, aortic and tricuspid valves: Secondary | ICD-10-CM | POA: Diagnosis not present

## 2017-08-09 DIAGNOSIS — I712 Thoracic aortic aneurysm, without rupture: Secondary | ICD-10-CM | POA: Diagnosis not present

## 2017-08-09 DIAGNOSIS — I313 Pericardial effusion (noninflammatory): Secondary | ICD-10-CM | POA: Diagnosis not present

## 2017-08-09 DIAGNOSIS — G8918 Other acute postprocedural pain: Secondary | ICD-10-CM | POA: Diagnosis not present

## 2017-08-09 DIAGNOSIS — Z01810 Encounter for preprocedural cardiovascular examination: Secondary | ICD-10-CM | POA: Diagnosis not present

## 2017-08-09 DIAGNOSIS — Z9911 Dependence on respirator [ventilator] status: Secondary | ICD-10-CM | POA: Diagnosis not present

## 2017-08-09 DIAGNOSIS — E8770 Fluid overload, unspecified: Secondary | ICD-10-CM | POA: Diagnosis not present

## 2017-08-09 DIAGNOSIS — K5090 Crohn's disease, unspecified, without complications: Secondary | ICD-10-CM | POA: Diagnosis not present

## 2017-08-09 DIAGNOSIS — Z9049 Acquired absence of other specified parts of digestive tract: Secondary | ICD-10-CM | POA: Diagnosis not present

## 2017-08-09 DIAGNOSIS — J9811 Atelectasis: Secondary | ICD-10-CM | POA: Diagnosis not present

## 2017-08-09 DIAGNOSIS — E039 Hypothyroidism, unspecified: Secondary | ICD-10-CM | POA: Diagnosis not present

## 2017-08-09 DIAGNOSIS — D62 Acute posthemorrhagic anemia: Secondary | ICD-10-CM | POA: Diagnosis not present

## 2017-08-09 DIAGNOSIS — R9431 Abnormal electrocardiogram [ECG] [EKG]: Secondary | ICD-10-CM | POA: Diagnosis not present

## 2017-08-09 DIAGNOSIS — R918 Other nonspecific abnormal finding of lung field: Secondary | ICD-10-CM | POA: Diagnosis not present

## 2017-08-09 DIAGNOSIS — I351 Nonrheumatic aortic (valve) insufficiency: Secondary | ICD-10-CM | POA: Diagnosis not present

## 2017-08-09 DIAGNOSIS — Q231 Congenital insufficiency of aortic valve: Secondary | ICD-10-CM | POA: Diagnosis not present

## 2017-08-09 DIAGNOSIS — I77810 Thoracic aortic ectasia: Secondary | ICD-10-CM | POA: Diagnosis not present

## 2017-08-09 DIAGNOSIS — I359 Nonrheumatic aortic valve disorder, unspecified: Secondary | ICD-10-CM | POA: Diagnosis not present

## 2017-08-09 DIAGNOSIS — I493 Ventricular premature depolarization: Secondary | ICD-10-CM | POA: Diagnosis not present

## 2017-08-09 DIAGNOSIS — Z932 Ileostomy status: Secondary | ICD-10-CM | POA: Diagnosis not present

## 2017-08-09 DIAGNOSIS — J939 Pneumothorax, unspecified: Secondary | ICD-10-CM | POA: Diagnosis not present

## 2017-08-09 DIAGNOSIS — R739 Hyperglycemia, unspecified: Secondary | ICD-10-CM | POA: Diagnosis not present

## 2017-08-09 DIAGNOSIS — Z7952 Long term (current) use of systemic steroids: Secondary | ICD-10-CM | POA: Diagnosis not present

## 2017-08-10 DIAGNOSIS — Z9911 Dependence on respirator [ventilator] status: Secondary | ICD-10-CM | POA: Diagnosis not present

## 2017-08-10 DIAGNOSIS — R9431 Abnormal electrocardiogram [ECG] [EKG]: Secondary | ICD-10-CM | POA: Diagnosis not present

## 2017-08-10 DIAGNOSIS — I351 Nonrheumatic aortic (valve) insufficiency: Secondary | ICD-10-CM | POA: Diagnosis not present

## 2017-08-10 DIAGNOSIS — E039 Hypothyroidism, unspecified: Secondary | ICD-10-CM | POA: Diagnosis not present

## 2017-08-10 DIAGNOSIS — E861 Hypovolemia: Secondary | ICD-10-CM | POA: Diagnosis not present

## 2017-08-10 DIAGNOSIS — I371 Nonrheumatic pulmonary valve insufficiency: Secondary | ICD-10-CM | POA: Diagnosis not present

## 2017-08-10 DIAGNOSIS — I083 Combined rheumatic disorders of mitral, aortic and tricuspid valves: Secondary | ICD-10-CM | POA: Diagnosis not present

## 2017-08-10 DIAGNOSIS — I712 Thoracic aortic aneurysm, without rupture: Secondary | ICD-10-CM | POA: Diagnosis not present

## 2017-08-10 DIAGNOSIS — I77810 Thoracic aortic ectasia: Secondary | ICD-10-CM | POA: Diagnosis not present

## 2017-08-11 DIAGNOSIS — I712 Thoracic aortic aneurysm, without rupture: Secondary | ICD-10-CM | POA: Diagnosis not present

## 2017-08-11 DIAGNOSIS — E039 Hypothyroidism, unspecified: Secondary | ICD-10-CM | POA: Diagnosis not present

## 2017-08-11 DIAGNOSIS — R918 Other nonspecific abnormal finding of lung field: Secondary | ICD-10-CM | POA: Diagnosis not present

## 2017-08-11 DIAGNOSIS — D62 Acute posthemorrhagic anemia: Secondary | ICD-10-CM | POA: Diagnosis not present

## 2017-08-11 DIAGNOSIS — G8918 Other acute postprocedural pain: Secondary | ICD-10-CM | POA: Diagnosis not present

## 2017-08-11 DIAGNOSIS — J9811 Atelectasis: Secondary | ICD-10-CM | POA: Diagnosis not present

## 2017-08-12 DIAGNOSIS — R9431 Abnormal electrocardiogram [ECG] [EKG]: Secondary | ICD-10-CM | POA: Diagnosis not present

## 2017-08-12 DIAGNOSIS — E785 Hyperlipidemia, unspecified: Secondary | ICD-10-CM | POA: Diagnosis not present

## 2017-08-12 DIAGNOSIS — G8918 Other acute postprocedural pain: Secondary | ICD-10-CM | POA: Diagnosis not present

## 2017-08-12 DIAGNOSIS — R918 Other nonspecific abnormal finding of lung field: Secondary | ICD-10-CM | POA: Diagnosis not present

## 2017-08-13 DIAGNOSIS — I77810 Thoracic aortic ectasia: Secondary | ICD-10-CM | POA: Diagnosis not present

## 2017-08-13 DIAGNOSIS — Q231 Congenital insufficiency of aortic valve: Secondary | ICD-10-CM | POA: Diagnosis not present

## 2017-08-13 DIAGNOSIS — I313 Pericardial effusion (noninflammatory): Secondary | ICD-10-CM | POA: Diagnosis not present

## 2017-08-13 DIAGNOSIS — R918 Other nonspecific abnormal finding of lung field: Secondary | ICD-10-CM | POA: Diagnosis not present

## 2017-08-13 DIAGNOSIS — J90 Pleural effusion, not elsewhere classified: Secondary | ICD-10-CM | POA: Diagnosis not present

## 2017-08-14 DIAGNOSIS — J90 Pleural effusion, not elsewhere classified: Secondary | ICD-10-CM | POA: Diagnosis not present

## 2017-08-14 DIAGNOSIS — J939 Pneumothorax, unspecified: Secondary | ICD-10-CM | POA: Diagnosis not present

## 2017-08-17 DIAGNOSIS — J9811 Atelectasis: Secondary | ICD-10-CM | POA: Diagnosis not present

## 2017-08-17 DIAGNOSIS — Z952 Presence of prosthetic heart valve: Secondary | ICD-10-CM | POA: Diagnosis not present

## 2017-08-17 DIAGNOSIS — J90 Pleural effusion, not elsewhere classified: Secondary | ICD-10-CM | POA: Diagnosis not present

## 2017-08-17 DIAGNOSIS — J939 Pneumothorax, unspecified: Secondary | ICD-10-CM | POA: Diagnosis not present

## 2017-08-17 DIAGNOSIS — I350 Nonrheumatic aortic (valve) stenosis: Secondary | ICD-10-CM | POA: Diagnosis not present

## 2017-08-17 DIAGNOSIS — Z9889 Other specified postprocedural states: Secondary | ICD-10-CM | POA: Diagnosis not present

## 2017-08-17 DIAGNOSIS — R9431 Abnormal electrocardiogram [ECG] [EKG]: Secondary | ICD-10-CM | POA: Diagnosis not present

## 2017-08-17 DIAGNOSIS — Z09 Encounter for follow-up examination after completed treatment for conditions other than malignant neoplasm: Secondary | ICD-10-CM | POA: Diagnosis not present

## 2017-09-02 DIAGNOSIS — F4322 Adjustment disorder with anxiety: Secondary | ICD-10-CM | POA: Diagnosis not present

## 2017-09-06 ENCOUNTER — Other Ambulatory Visit: Payer: Self-pay | Admitting: *Deleted

## 2017-09-06 MED ORDER — MELOXICAM 15 MG PO TABS: 15.0000 mg | ORAL_TABLET | Freq: Every day | ORAL | 2 refills | Status: DC

## 2017-09-07 DIAGNOSIS — F4322 Adjustment disorder with anxiety: Secondary | ICD-10-CM | POA: Diagnosis not present

## 2017-09-13 DIAGNOSIS — F4322 Adjustment disorder with anxiety: Secondary | ICD-10-CM | POA: Diagnosis not present

## 2017-09-15 DIAGNOSIS — R002 Palpitations: Secondary | ICD-10-CM | POA: Diagnosis not present

## 2017-09-15 DIAGNOSIS — G8918 Other acute postprocedural pain: Secondary | ICD-10-CM | POA: Diagnosis not present

## 2017-09-15 DIAGNOSIS — Z9889 Other specified postprocedural states: Secondary | ICD-10-CM | POA: Diagnosis not present

## 2017-09-15 DIAGNOSIS — Z298 Encounter for other specified prophylactic measures: Secondary | ICD-10-CM | POA: Diagnosis not present

## 2017-09-24 ENCOUNTER — Telehealth (HOSPITAL_COMMUNITY): Payer: Self-pay

## 2017-09-24 NOTE — Telephone Encounter (Signed)
Attempted to call patient in regards to Cardiac Rehab - lm on vm

## 2017-09-24 NOTE — Telephone Encounter (Signed)
Patients insurance is active and benefits verified through [employer] - No co-pay, deductible amount of $4,000/$0.00 has been met, out of pocket amount of $6,650/$6,493.92 has been met, 20% co-insurance, and no pre-authorization is required. Passport/reference [id number]

## 2017-09-27 DIAGNOSIS — Z932 Ileostomy status: Secondary | ICD-10-CM | POA: Diagnosis not present

## 2017-09-27 DIAGNOSIS — K5090 Crohn's disease, unspecified, without complications: Secondary | ICD-10-CM | POA: Diagnosis not present

## 2017-09-28 DIAGNOSIS — K5090 Crohn's disease, unspecified, without complications: Secondary | ICD-10-CM | POA: Diagnosis not present

## 2017-09-28 DIAGNOSIS — M542 Cervicalgia: Secondary | ICD-10-CM | POA: Diagnosis not present

## 2017-09-28 DIAGNOSIS — M2550 Pain in unspecified joint: Secondary | ICD-10-CM | POA: Diagnosis not present

## 2017-09-28 DIAGNOSIS — Z932 Ileostomy status: Secondary | ICD-10-CM | POA: Diagnosis not present

## 2017-09-28 DIAGNOSIS — M79601 Pain in right arm: Secondary | ICD-10-CM | POA: Diagnosis not present

## 2017-09-29 DIAGNOSIS — R0602 Shortness of breath: Secondary | ICD-10-CM | POA: Diagnosis not present

## 2017-09-29 DIAGNOSIS — I9589 Other hypotension: Secondary | ICD-10-CM | POA: Diagnosis not present

## 2017-09-29 DIAGNOSIS — J9811 Atelectasis: Secondary | ICD-10-CM | POA: Diagnosis not present

## 2017-09-29 DIAGNOSIS — I350 Nonrheumatic aortic (valve) stenosis: Secondary | ICD-10-CM | POA: Diagnosis not present

## 2017-09-29 DIAGNOSIS — R000 Tachycardia, unspecified: Secondary | ICD-10-CM | POA: Diagnosis not present

## 2017-09-29 DIAGNOSIS — J90 Pleural effusion, not elsewhere classified: Secondary | ICD-10-CM | POA: Diagnosis not present

## 2017-09-29 DIAGNOSIS — Z9889 Other specified postprocedural states: Secondary | ICD-10-CM | POA: Diagnosis not present

## 2017-09-29 DIAGNOSIS — Q231 Congenital insufficiency of aortic valve: Secondary | ICD-10-CM | POA: Diagnosis not present

## 2017-09-29 DIAGNOSIS — I77810 Thoracic aortic ectasia: Secondary | ICD-10-CM | POA: Diagnosis not present

## 2017-09-29 DIAGNOSIS — Z952 Presence of prosthetic heart valve: Secondary | ICD-10-CM | POA: Diagnosis not present

## 2017-09-29 DIAGNOSIS — Z8709 Personal history of other diseases of the respiratory system: Secondary | ICD-10-CM | POA: Diagnosis not present

## 2017-09-29 DIAGNOSIS — I712 Thoracic aortic aneurysm, without rupture: Secondary | ICD-10-CM | POA: Diagnosis not present

## 2017-09-29 DIAGNOSIS — I351 Nonrheumatic aortic (valve) insufficiency: Secondary | ICD-10-CM | POA: Diagnosis not present

## 2017-10-04 DIAGNOSIS — E039 Hypothyroidism, unspecified: Secondary | ICD-10-CM | POA: Diagnosis not present

## 2017-10-06 ENCOUNTER — Other Ambulatory Visit: Payer: Self-pay | Admitting: Obstetrics and Gynecology

## 2017-10-06 ENCOUNTER — Ambulatory Visit: Payer: BLUE CROSS/BLUE SHIELD

## 2017-10-06 ENCOUNTER — Ambulatory Visit
Admission: RE | Admit: 2017-10-06 | Discharge: 2017-10-06 | Disposition: A | Discharge status: Home / Self Care | Payer: BLUE CROSS/BLUE SHIELD | Source: Ambulatory Visit | Attending: Obstetrics and Gynecology | Admitting: Obstetrics and Gynecology

## 2017-10-06 DIAGNOSIS — R011 Cardiac murmur, unspecified: Secondary | ICD-10-CM | POA: Diagnosis not present

## 2017-10-06 DIAGNOSIS — F4322 Adjustment disorder with anxiety: Secondary | ICD-10-CM | POA: Diagnosis not present

## 2017-10-06 DIAGNOSIS — R5383 Other fatigue: Secondary | ICD-10-CM | POA: Diagnosis not present

## 2017-10-06 DIAGNOSIS — N6310 Unspecified lump in the right breast, unspecified quadrant: Secondary | ICD-10-CM

## 2017-10-06 DIAGNOSIS — K5090 Crohn's disease, unspecified, without complications: Secondary | ICD-10-CM | POA: Diagnosis not present

## 2017-10-06 DIAGNOSIS — E039 Hypothyroidism, unspecified: Secondary | ICD-10-CM | POA: Diagnosis not present

## 2017-10-06 DIAGNOSIS — Z1231 Encounter for screening mammogram for malignant neoplasm of breast: Secondary | ICD-10-CM

## 2017-10-12 DIAGNOSIS — F4322 Adjustment disorder with anxiety: Secondary | ICD-10-CM | POA: Diagnosis not present

## 2017-10-27 ENCOUNTER — Telehealth (HOSPITAL_COMMUNITY): Payer: Self-pay

## 2017-10-27 DIAGNOSIS — G43819 Other migraine, intractable, without status migrainosus: Secondary | ICD-10-CM | POA: Diagnosis not present

## 2017-10-27 DIAGNOSIS — Z961 Presence of intraocular lens: Secondary | ICD-10-CM | POA: Diagnosis not present

## 2017-10-27 DIAGNOSIS — L719 Rosacea, unspecified: Secondary | ICD-10-CM | POA: Diagnosis not present

## 2017-10-27 NOTE — Telephone Encounter (Signed)
Cardiac Rehab Medication Review by a Pharmacist  Does the patient  feel that his/her medications are working for him/her?  yes  Has the patient been experiencing any side effects to the medications prescribed?  no  Does the patient measure his/her own blood pressure or blood glucose at home?  yes 100S/60S  Does the patient have any problems obtaining medications due to transportation or finances?   yes  Understanding of regimen: good Understanding of indications: good Potential of compliance: excellent    Pharmacist comments: N/A    Brittany Whitney Vaanya Shambaugh 10/27/2017 4:54 PM

## 2017-10-28 DIAGNOSIS — F4322 Adjustment disorder with anxiety: Secondary | ICD-10-CM | POA: Diagnosis not present

## 2017-11-02 IMAGING — MR MR ANKLE*L* W/O CM
4 of 5 series · 26 of 40 positions shown · non-contrast
Comparison: None.

CLINICAL DATA: Medial left ankle pain with swelling and erythema
for 3 months. Long-term steroid use for ulcerative colitis.
Increased pain with weight-bearing.

EXAM:
MRI OF THE LEFT ANKLE WITHOUT CONTRAST
TECHNIQUE: Multiplanar, multisequence MR imaging of the ankle was performed. No
intravenous contrast was administered.

[Series 5: T2 fat-sat · coronal · 4.0mm · 0.33mm/px · 9 of 28 slices shown (1 of 3)]
[im 1/28]
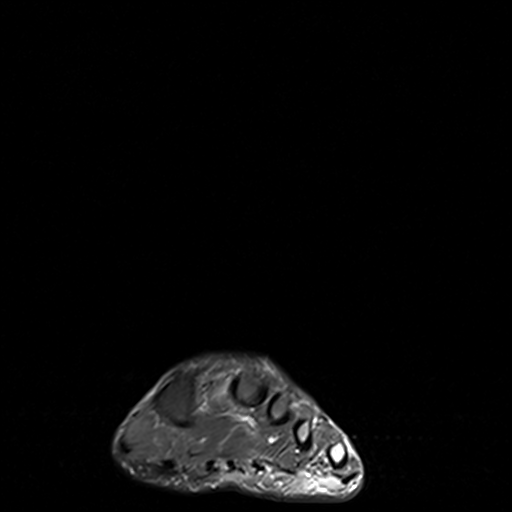
[im 4/28]
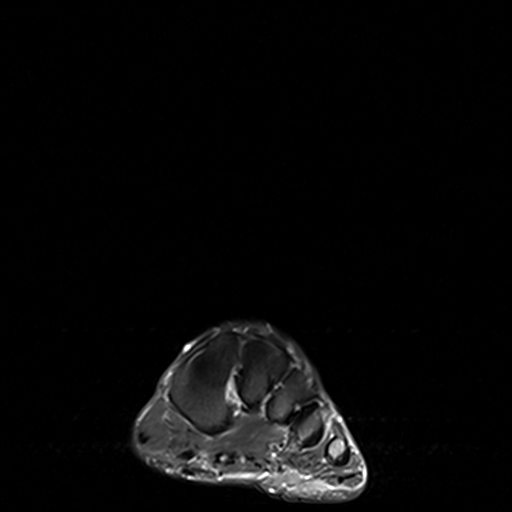
[im 7/28]
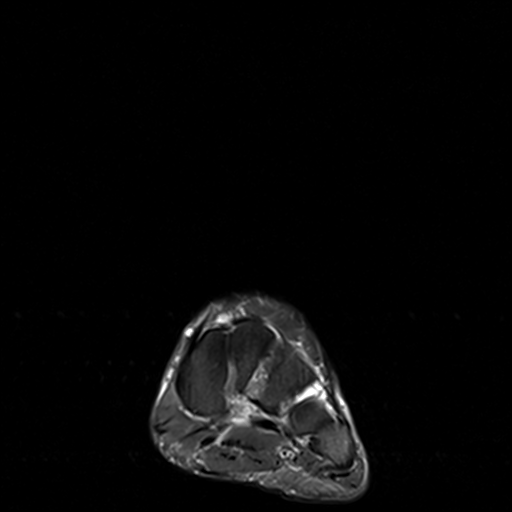
[im 11/28]
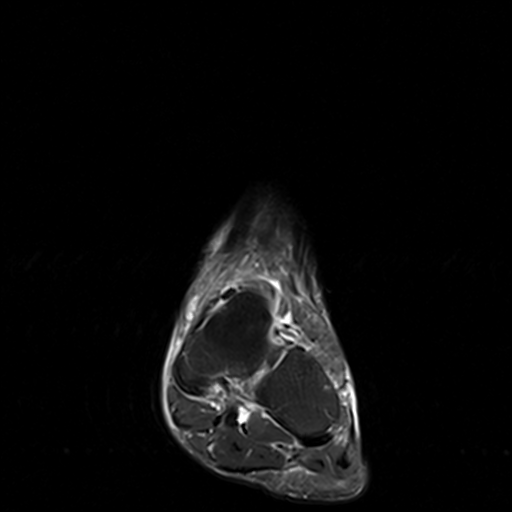
[im 14/28]
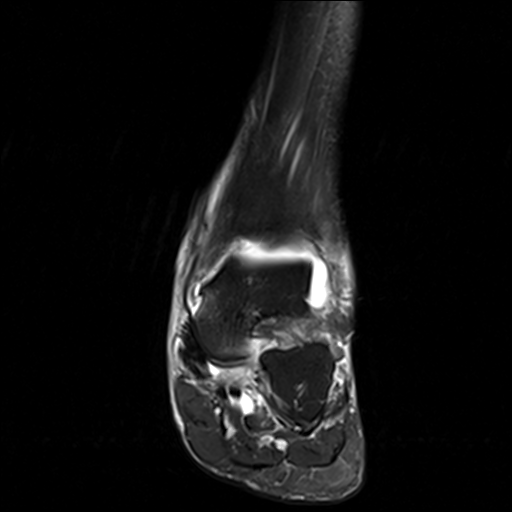
[im 17/28]
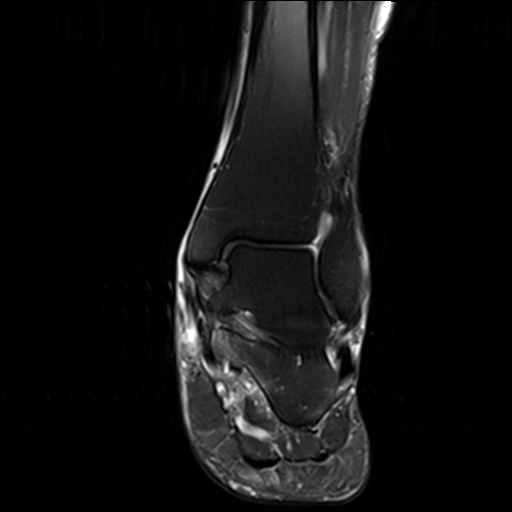
[im 21/28]
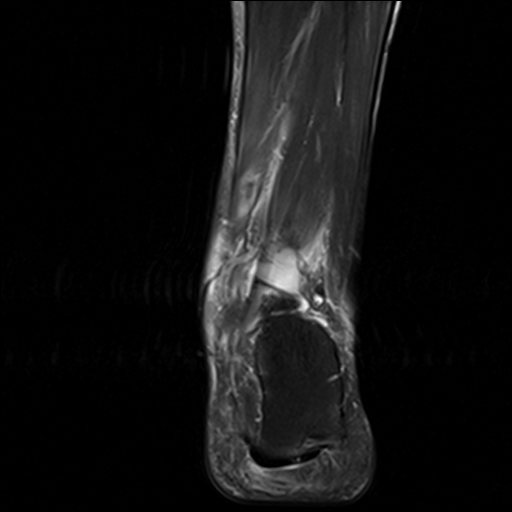
[im 24/28]
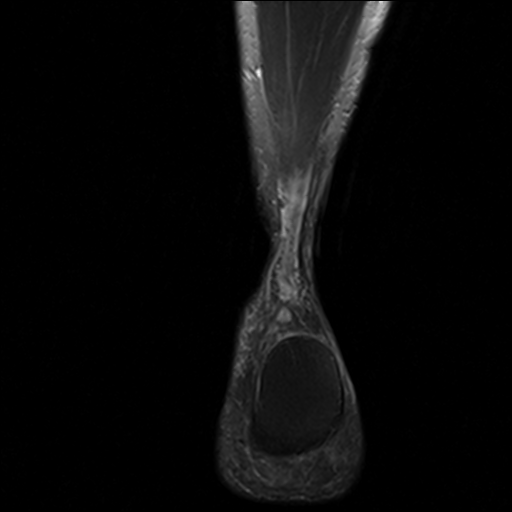
[im 28/28]
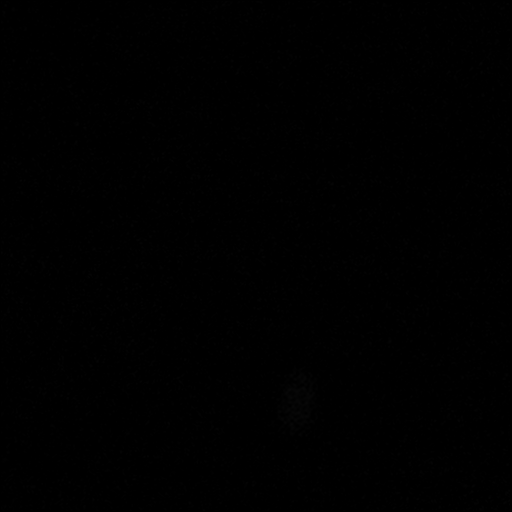

[Series 6: T2 fat-sat · sagittal · 3.0mm · 0.33mm/px · 5 of 20 slices shown (2 of 3)]
[im 1/20]
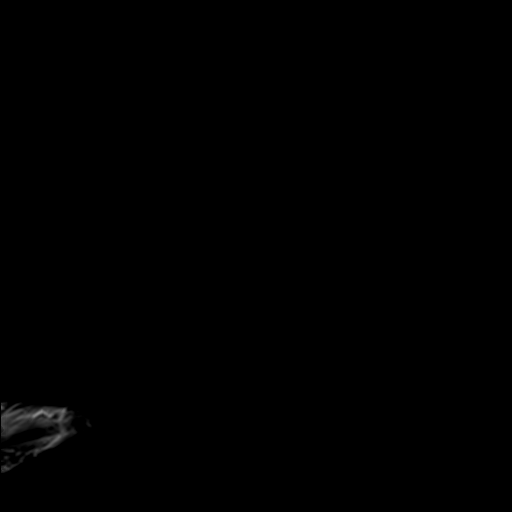
[im 4/20]
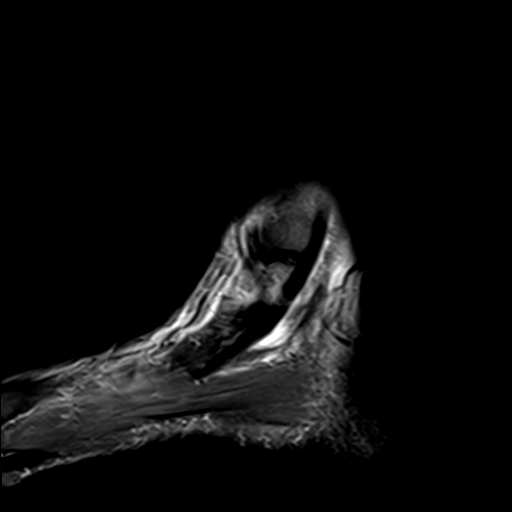
[im 7/20]
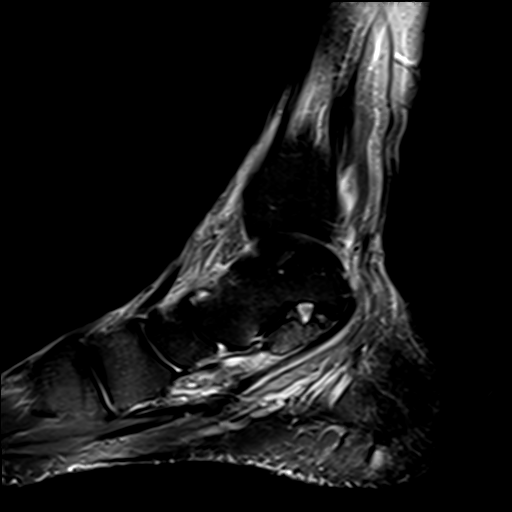
[im 10/20]
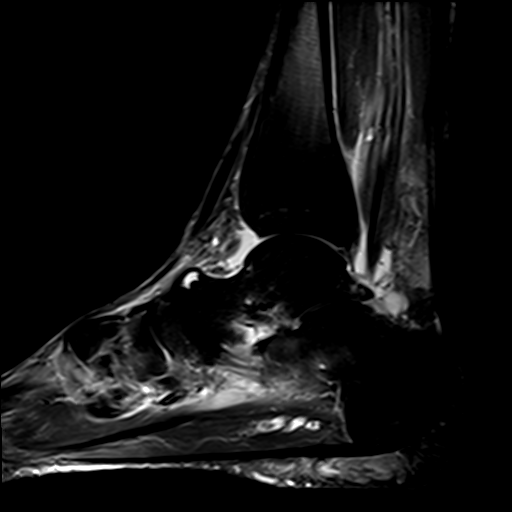
[im 16/20]
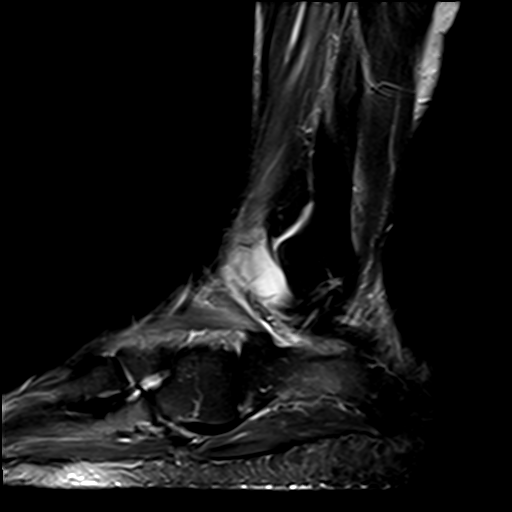

[Series 7: T2 fat-sat · axial · 4.0mm · 0.53mm/px · z∈[-96,-1]mm · 3 of 27 slices shown (3 of 3)]
[im 4/27]
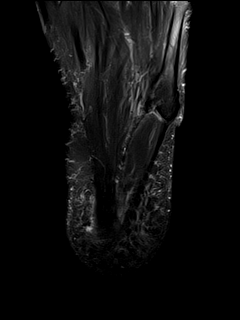
[im 14/27]
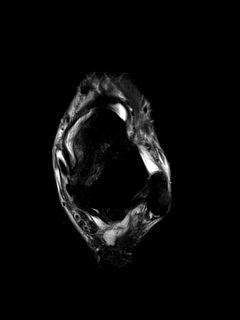
[im 23/27]
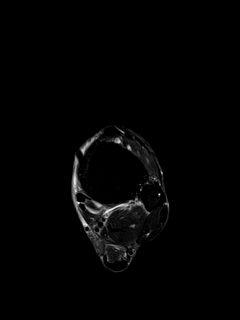

[Series 8: PD fat-sat · axial · 4.0mm · 0.53mm/px · z∈[-111,+19]mm · 9 of 27 slices shown]
[im 1/27]
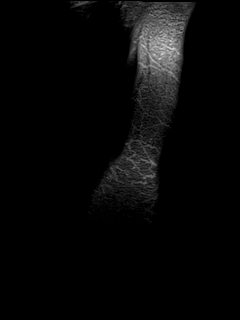
[im 4/27]
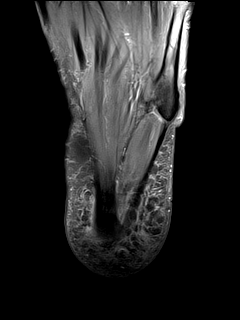
[im 7/27]
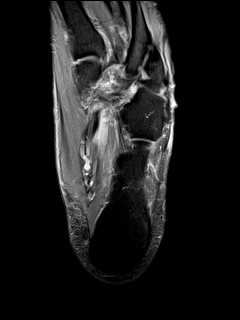
[im 10/27]
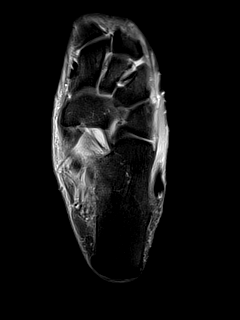
[im 14/27]
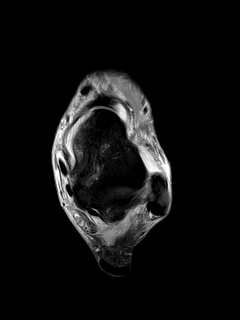
[im 17/27]
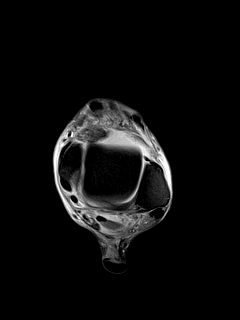
[im 20/27]
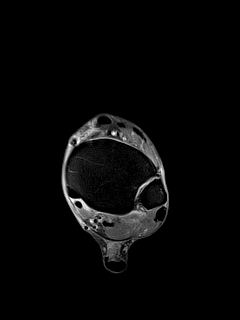
[im 23/27]
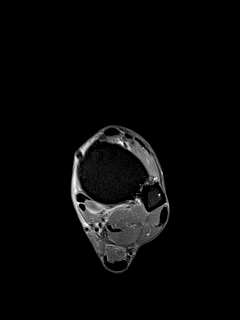
[im 27/27]
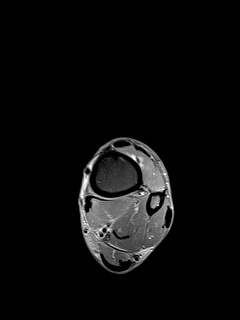

[26 of 40 positions shown; findings below may reference images not displayed]

FINDINGS: TENDONS

Peroneal: Mild common peroneus tenosynovitis.

Posteromedial: Moderate distal tibialis posterior tenosynovitis and
tendinopathy. Flexor hallucis longus tenosynovitis.

Anterior: Mild tibialis anterior tenosynovitis, image [DATE].

Achilles: Unremarkable

Plantar Fascia: Minimal thickening of the medial band of the plantar
fascia with subcutaneous edema below the plantar fascia on image
[DATE]. Appearance compatible with mild plantar fasciitis.

LIGAMENTS

Lateral: Abnormal attenuated anterior talofibular ligament
suspicious for ATFL tear. Mildly thickened calcaneofibular ligament.

Medial: Accentuated conspicuity of the recess between the
medioplantar oblique and inferoplantar longitudinal portions of the
spring ligament on image [DATE] although I do not see a definite tear
of the spring ligament. Deltoid ligament intact.

CARTILAGE

Ankle Joint: Small tibiotalar joint effusion most notable
anteriorly.

Subtalar Joints/Sinus Tarsi: Degenerative spurring of the posterior
subtalar facet posteriorly. Low-level degenerative subcortical edema
in the sustentaculum tali. Small dorsal effusion of the
talonavicular articulation with mild associated spurring.

Bones: Small degenerative subcortical focus of edema in along the
distal calcaneal articular surface, image [DATE]. Cough

Other: No supplemental non-categorized findings.
IMPRESSION: 1. Moderate tibialis posterior tenosynovitis and tendinopathy.
Correlate clinically in assessing for tibialis posterior
dysfunction.
2. Flexor hallucis longus tenosynovitis.
3. There is some low-grade marrow edema in the sustentaculum tali,
likely reactive.
4. Somewhat exaggerated the size of the recess between the
medioplantar oblique and inferoplantar longitudinal component of the
spring ligament. Sometimes this can indicate a small tear of the
medioplantar oblique portion.
5. Mild common peroneus tenosynovitis.
6. Torn anterior talofibular ligament. Mildly thickened
calcaneofibular ligament might be sprained.
7. Small tibiotalar joint effusion and small effusion of the
talonavicular articulation.

## 2017-11-03 DIAGNOSIS — F4322 Adjustment disorder with anxiety: Secondary | ICD-10-CM | POA: Diagnosis not present

## 2017-11-04 ENCOUNTER — Encounter (HOSPITAL_COMMUNITY)
Admission: RE | Admit: 2017-11-04 | Discharge: 2017-11-04 | Disposition: A | Discharge status: Home / Self Care | Payer: BLUE CROSS/BLUE SHIELD | Source: Ambulatory Visit | Attending: Cardiology | Admitting: Cardiology

## 2017-11-04 ENCOUNTER — Encounter (HOSPITAL_COMMUNITY): Payer: Self-pay

## 2017-11-04 VITALS — BP 98/64 | HR 81 | Ht 67.0 in | Wt 124.6 lb

## 2017-11-04 DIAGNOSIS — K5010 Crohn's disease of large intestine without complications: Secondary | ICD-10-CM | POA: Diagnosis not present

## 2017-11-04 DIAGNOSIS — Z79899 Other long term (current) drug therapy: Secondary | ICD-10-CM | POA: Insufficient documentation

## 2017-11-04 DIAGNOSIS — Z85820 Personal history of malignant melanoma of skin: Secondary | ICD-10-CM | POA: Insufficient documentation

## 2017-11-04 DIAGNOSIS — Z79890 Hormone replacement therapy (postmenopausal): Secondary | ICD-10-CM | POA: Diagnosis not present

## 2017-11-04 DIAGNOSIS — Z9889 Other specified postprocedural states: Secondary | ICD-10-CM | POA: Insufficient documentation

## 2017-11-04 DIAGNOSIS — Z8679 Personal history of other diseases of the circulatory system: Secondary | ICD-10-CM | POA: Diagnosis not present

## 2017-11-04 DIAGNOSIS — M1990 Unspecified osteoarthritis, unspecified site: Secondary | ICD-10-CM | POA: Insufficient documentation

## 2017-11-04 DIAGNOSIS — E039 Hypothyroidism, unspecified: Secondary | ICD-10-CM | POA: Diagnosis not present

## 2017-11-04 DIAGNOSIS — Z952 Presence of prosthetic heart valve: Secondary | ICD-10-CM

## 2017-11-04 DIAGNOSIS — Z932 Ileostomy status: Secondary | ICD-10-CM | POA: Insufficient documentation

## 2017-11-04 NOTE — Progress Notes (Signed)
Cardiac Individual Treatment Plan  Patient Details  Name: Brittany Whitney MRN: [id number] Date of Birth: [birth year] Referring Provider:     CARDIAC REHAB PHASE II ORIENTATION from 11/04/2017 in [place]  Referring Provider  Adrian Prows, MD      Initial Encounter Date:    CARDIAC REHAB PHASE II ORIENTATION from 11/04/2017 in [place]  Date  11/04/17      Visit Diagnosis: 08/10/17 S/P AVR (repair) at [facility] Medications on Admission:  Current Outpatient Medications:  .  Biotin 5000 MCG TABS, Take 1 tablet by mouth daily., Disp: , Rfl:  .  Bismuth Subgallate (DEVROM) 200 MG CHEW, Chew 1 tablet by mouth 2 (two) times daily., Disp: , Rfl:  .  CALCIUM & MAGNESIUM CARBONATES PO, Take 3 tablets by mouth 2 (two) times daily. 500/500, Disp: , Rfl:  .  Cholecalciferol 5000 units capsule, Take 5,000 Units by mouth 2 (two) times daily., Disp: , Rfl:  .  DHEA 10 MG CAPS, Take 1 capsule by mouth every morning. , Disp: , Rfl:  .  Diindolylmethane POWD, Take 1 capsule by mouth 2 (two) times daily. 100 mg capsule, Disp: , Rfl:  .  doxycycline (PERIOSTAT) 20 MG tablet, Take 20 mg by mouth 2 (two) times daily., Disp: , Rfl:  .  estradiol (VIVELLE-DOT) 0.025 MG/24HR, APPLY 1 PATCH TWICE WEEKLY AS DIRECTED., Disp: , Rfl: 1 .  ibuprofen (ADVIL,MOTRIN) 200 MG tablet, Take 200 mg by mouth every 6 (six) hours as needed., Disp: , Rfl:  .  levothyroxine (SYNTHROID, LEVOTHROID) 100 MCG tablet, Take 100 mcg by mouth daily before breakfast., Disp: , Rfl:  .  meloxicam (MOBIC) 15 MG tablet, Take 1 tablet (15 mg total) by mouth daily., Disp: 90 tablet, Rfl: 2 .  Multiple Vitamin (MULTIVITAMIN WITH MINERALS) TABS, Take 1 tablet by mouth daily. , Disp: , Rfl:  .  naratriptan (AMERGE) 2.5 MG tablet, Take 2.5 mg by mouth as needed. Take one (1) tablet at onset of headache; if returns or does not resolve, may repeat after 4 hours; do  not exceed five (5) mg in 24 hours., Disp: , Rfl:  .  Omega-3 1000 MG CAPS, Take 1 capsule by mouth 2 (two) times daily., Disp: , Rfl:  .  oxyCODONE-acetaminophen (PERCOCET) 7.5-325 MG tablet, Take 1 tablet by mouth every 6 (six) hours as needed for severe pain. (Patient not taking: Reported on 06/29/2017), Disp: 12 tablet, Rfl: 0 .  predniSONE (DELTASONE) 5 MG tablet, Take 5 mg by mouth every other day. TAKES IN AM, Disp: , Rfl:  .  PRESCRIPTION MEDICATION, Apply 1 application topically daily. Compound cream: testosterone .015% in a 10 ml syringe. Apply 1 ml appilcation to thin skinned area(s), Disp: , Rfl:  .  progesterone (PROMETRIUM) 100 MG capsule, Take 125 mg by mouth at bedtime. COMPOUND MEDICATION IN PILL FORM, Disp: , Rfl:  .  sulfaSALAzine (AZULFIDINE) 500 MG tablet, Take 500 mg by mouth 2 (two) times daily., Disp: , Rfl:  .  SUMAtriptan (IMITREX) 50 MG tablet, Take 50 mg by mouth every 2 (two) hours as needed., Disp: , Rfl:  .  traMADol (ULTRAM) 50 MG tablet, TAKE 1 TABLET BY MOUTH TWICE A DAY (Patient taking differently: Take 50 mg by mouth daily. TAKE 1 TABLET BY MOUTH TWICE A DAY), Disp: 60 tablet, Rfl: 2 .  Vitamin D, Ergocalciferol, (DRISDOL) 50000 UNITS CAPS, Take 50,000 Units by mouth every  14 (fourteen) days. , Disp: , Rfl:  .  zolpidem (AMBIEN) 10 MG tablet, Take 5 mg by mouth at bedtime. for sleep, Disp: , Rfl: 3  Past Medical History: Past Medical History:  Diagnosis Date  . Arthritis   . Crohn's colitis ([place])   . Endometrial polyp   . History of exercise stress test 12-25-2016  by dr Einar Gip   no evidence ischemia, exercise tolerence excellent, no significant arrhythmias  . History of malignant melanoma of skin    2005  post excision back area-- localized  . Hypothyroidism   . Ileostomy in place [facility])    for crohn's colitis -- external pouch  . Migraines   . Severe aortic valve regurgitation cardiologist-  dr Einar Gip--  dx by echo 2017   echo 06/ 10/2016 no change except  noted mod. to sev. pulmonry htn/  verified by cardiac cath 07/ 2018 showed no evidence pulmonary htn  . Vitamin D deficiency     Tobacco Use: Social History   Tobacco Use  Smoking Status Never Smoker  Smokeless Tobacco Never Used    Labs: Recent Review Flowsheet Data    Labs for ITP Cardiac and Pulmonary Rehab Latest Ref Rng & Units 10/27/2016 10/27/2016   PHART 7.350 - 7.450 - 7.579(H)   PCO2ART 32.0 - 48.0 mmHg - 22.0(L)   HCO3 20.0 - 28.0 mmol/L 22.7 20.6   TCO2 0 - 100 mmol/L 24 21   O2SAT % 70.0 99.0      Capillary Blood Glucose: No results found for: GLUCAP   Exercise Target Goals: Date: 11/04/17  Exercise Program Goal: Individual exercise prescription set using results from initial 6 min walk test and THRR while considering  patient's activity barriers and safety.   Exercise Prescription Goal: Initial exercise prescription builds to 30-45 minutes a day of aerobic activity, 2-3 days per week.  Home exercise guidelines will be given to patient during program as part of exercise prescription that the participant will acknowledge.  Activity Barriers & Risk Stratification: Activity Barriers & Cardiac Risk Stratification - 11/04/17 0905      Activity Barriers & Cardiac Risk Stratification   Activity Barriers  None    Cardiac Risk Stratification  High       6 Minute Walk: 6 Minute Walk    Row Name 11/04/17 1046         6 Minute Walk   Phase  Initial     Distance  1964 feet     Walk Time  6 minutes     # of Rest Breaks  0     MPH  3.7     METS  4.9     RPE  5.1     VO2 Peak  17.3     Symptoms  No     Resting HR  81 bpm     Resting BP  98/64     Resting Oxygen Saturation   99 %     Exercise Oxygen Saturation  during 6 min walk  98 %     Max Ex. HR  123 bpm     Max Ex. BP  110/66     2 Minute Post BP  104/70        Oxygen Initial Assessment:   Oxygen Re-Evaluation:   Oxygen Discharge (Final Oxygen Re-Evaluation):   Initial Exercise  Prescription: Initial Exercise Prescription - 11/04/17 1100      Date of Initial Exercise RX and Referring Provider   Date  11/04/17    Referring Provider  Adrian Prows, MD      Treadmill   MPH  3.3    Grade  1    Minutes  10    METs  3.98      Bike   Level  1    Minutes  10    METs  4.43      NuStep   Level  4    SPM  90    Minutes  10    METs  3      Prescription Details   Frequency (times per week)  3    Duration  Progress to 30 minutes of continuous aerobic without signs/symptoms of physical distress      Intensity   THRR 40-80% of Max Heartrate  63-126    Ratings of Perceived Exertion  11-13    Perceived Dyspnea  0-4      Progression   Progression  Continue to progress workloads to maintain intensity without signs/symptoms of physical distress.      Resistance Training   Training Prescription  Yes    Weight  3lbs    Reps  10-15       Perform Capillary Blood Glucose checks as needed.  Exercise Prescription Changes:   Exercise Comments:   Exercise Goals and Review: Exercise Goals    Row Name 11/04/17 0905             Exercise Goals   Increase Physical Activity  Yes       Intervention  Provide advice, education, support and counseling about physical activity/exercise needs.;Develop an individualized exercise prescription for aerobic and resistive training based on initial evaluation findings, risk stratification, comorbidities and participant's personal goals.       Expected Outcomes  Short Term: Attend rehab on a regular basis to increase amount of physical activity.;Long Term: Add in home exercise to make exercise part of routine and to increase amount of physical activity.;Long Term: Exercising regularly at least 3-5 days a week.       Increase Strength and Stamina  Yes       Intervention  Provide advice, education, support and counseling about physical activity/exercise needs.;Develop an individualized exercise prescription for aerobic and resistive  training based on initial evaluation findings, risk stratification, comorbidities and participant's personal goals.       Expected Outcomes  Short Term: Increase workloads from initial exercise prescription for resistance, speed, and METs.;Short Term: Perform resistance training exercises routinely during rehab and add in resistance training at home;Long Term: Improve cardiorespiratory fitness, muscular endurance and strength as measured by increased METs and functional capacity (6MWT)       Able to understand and use rate of perceived exertion (RPE) scale  Yes       Intervention  Provide education and explanation on how to use RPE scale       Expected Outcomes  Short Term: Able to use RPE daily in rehab to express subjective intensity level;Long Term:  Able to use RPE to guide intensity level when exercising independently       Knowledge and understanding of Target Heart Rate Range (THRR)  Yes       Intervention  Provide education and explanation of THRR including how the numbers were predicted and where they are located for reference       Expected Outcomes  Short Term: Able to state/look up THRR;Long Term: Able to use THRR to govern intensity when exercising independently;Short Term: Able to  use daily as guideline for intensity in rehab       Able to check pulse independently  Yes       Intervention  Provide education and demonstration on how to check pulse in carotid and radial arteries.;Review the importance of being able to check your own pulse for safety during independent exercise       Expected Outcomes  Short Term: Able to explain why pulse checking is important during independent exercise;Long Term: Able to check pulse independently and accurately       Understanding of Exercise Prescription  Yes       Intervention  Provide education, explanation, and written materials on patient's individual exercise prescription       Expected Outcomes  Long Term: Able to explain home exercise prescription  to exercise independently;Short Term: Able to explain program exercise prescription          Exercise Goals Re-Evaluation :    Discharge Exercise Prescription (Final Exercise Prescription Changes):   Nutrition:  Target Goals: Understanding of nutrition guidelines, daily intake of sodium <1525m, cholesterol <205m calories 30% from fat and 7% or less from saturated fats, daily to have 5 or more servings of fruits and vegetables.  Biometrics: Pre Biometrics - 11/04/17 1049      Pre Biometrics   Height  5' 7"  (1.702 m)    Weight  124 lb 9 oz (56.5 kg)    Waist Circumference  28 inches    Hip Circumference  35 inches    Waist to Hip Ratio  0.8 %    BMI (Calculated)  19.5    Triceps Skinfold  22 mm    % Body Fat  30.4 %    Grip Strength  32 kg    Flexibility  18 in    Single Leg Stand  30 seconds        Nutrition Therapy Plan and Nutrition Goals:   Nutrition Assessments:   Nutrition Goals Re-Evaluation:   Nutrition Goals Re-Evaluation:   Nutrition Goals Discharge (Final Nutrition Goals Re-Evaluation):   Psychosocial: Target Goals: Acknowledge presence or absence of significant depression and/or stress, maximize coping skills, provide positive support system. Participant is able to verbalize types and ability to use techniques and skills needed for reducing stress and depression.  Initial Review & Psychosocial Screening: Initial Psych Review & Screening - 11/04/17 0920      Initial Review   Current issues with  None Identified      Family Dynamics   Good Support System?  Yes LaMariluzists her spouse, family, and friends as sources of support for her.       Barriers   Psychosocial barriers to participate in program  There are no identifiable barriers or psychosocial needs.      Screening Interventions   Interventions  Encouraged to exercise       Quality of Life Scores: Quality of Life - 11/04/17 0920      Quality of Life   Select  Quality of Life       Quality of Life Scores   Health/Function Pre  27.21 %    Socioeconomic Pre  30 %    Psych/Spiritual Pre  29.14 %    Family Pre  27.6 %    GLOBAL Pre  28.22 %      Scores of 19 and below usually indicate a poorer quality of life in these areas.  A difference of  2-3 points is a clinically meaningful difference.  A  difference of 2-3 points in the total score of the Quality of Life Index has been associated with significant improvement in overall quality of life, self-image, physical symptoms, and general health in studies assessing change in quality of life.  PHQ-9: Recent Review Flowsheet Data    Depression screen [facility] 2/9 07/07/2016 03/31/2016 02/13/2016   Decreased Interest 0 0 0   Down, Depressed, Hopeless 0 0 0   PHQ - 2 Score 0 0 0     Interpretation of Total Score  Total Score Depression Severity:  1-4 = Minimal depression, 5-9 = Mild depression, 10-14 = Moderate depression, 15-19 = Moderately severe depression, 20-27 = Severe depression   Psychosocial Evaluation and Intervention:   Psychosocial Re-Evaluation:   Psychosocial Discharge (Final Psychosocial Re-Evaluation):   Vocational Rehabilitation: Provide vocational rehab assistance to qualifying candidates.   Vocational Rehab Evaluation & Intervention: Vocational Rehab - 11/04/17 1131      Initial Vocational Rehab Evaluation & Intervention   Assessment shows need for Vocational Rehabilitation  No       Education: Education Goals: Education classes will be provided on a weekly basis, covering required topics. Participant will state understanding/return demonstration of topics presented.  Learning Barriers/Preferences: Learning Barriers/Preferences - 11/04/17 0905      Learning Barriers/Preferences   Learning Barriers  None    Learning Preferences  Video;Pictoral;Written Material;Computer/Internet       Education Topics: Count Your Pulse:  -Group instruction provided by verbal instruction, demonstration,  patient participation and written materials to support subject.  Instructors address importance of being able to find your pulse and how to count your pulse when at home without a heart monitor.  Patients get hands on experience counting their pulse with staff help and individually.   Heart Attack, Angina, and Risk Factor Modification:  -Group instruction provided by verbal instruction, video, and written materials to support subject.  Instructors address signs and symptoms of angina and heart attacks.    Also discuss risk factors for heart disease and how to make changes to improve heart health risk factors.   Functional Fitness:  -Group instruction provided by verbal instruction, demonstration, patient participation, and written materials to support subject.  Instructors address safety measures for doing things around the house.  Discuss how to get up and down off the floor, how to pick things up properly, how to safely get out of a chair without assistance, and balance training.   Meditation and Mindfulness:  -Group instruction provided by verbal instruction, patient participation, and written materials to support subject.  Instructor addresses importance of mindfulness and meditation practice to help reduce stress and improve awareness.  Instructor also leads participants through a meditation exercise.    Stretching for Flexibility and Mobility:  -Group instruction provided by verbal instruction, patient participation, and written materials to support subject.  Instructors lead participants through series of stretches that are designed to increase flexibility thus improving mobility.  These stretches are additional exercise for major muscle groups that are typically performed during regular warm up and cool down.   Hands Only CPR:  -Group verbal, video, and participation provides a basic overview of AHA guidelines for community CPR. Role-play of emergencies allow participants the opportunity  to practice calling for help and chest compression technique with discussion of AED use.   Hypertension: -Group verbal and written instruction that provides a basic overview of hypertension including the most recent diagnostic guidelines, risk factor reduction with self-care instructions and medication management.    Nutrition I class:  Heart Healthy Eating:  -Group instruction provided by PowerPoint slides, verbal discussion, and written materials to support subject matter. The instructor gives an explanation and review of the Therapeutic Lifestyle Changes diet recommendations, which includes a discussion on lipid goals, dietary fat, sodium, fiber, plant stanol/sterol esters, sugar, and the components of a well-balanced, healthy diet.   Nutrition II class: Lifestyle Skills:  -Group instruction provided by PowerPoint slides, verbal discussion, and written materials to support subject matter. The instructor gives an explanation and review of label reading, grocery shopping for heart health, heart healthy recipe modifications, and ways to make healthier choices when eating out.   Diabetes Question & Answer:  -Group instruction provided by PowerPoint slides, verbal discussion, and written materials to support subject matter. The instructor gives an explanation and review of diabetes co-morbidities, pre- and post-prandial blood glucose goals, pre-exercise blood glucose goals, signs, symptoms, and treatment of hypoglycemia and hyperglycemia, and foot care basics.   Diabetes Blitz:  -Group instruction provided by PowerPoint slides, verbal discussion, and written materials to support subject matter. The instructor gives an explanation and review of the physiology behind type 1 and type 2 diabetes, diabetes medications and rational behind using different medications, pre- and post-prandial blood glucose recommendations and Hemoglobin A1c goals, diabetes diet, and exercise including blood glucose  guidelines for exercising safely.    Portion Distortion:  -Group instruction provided by PowerPoint slides, verbal discussion, written materials, and food models to support subject matter. The instructor gives an explanation of serving size versus portion size, changes in portions sizes over the last 20 years, and what consists of a serving from each food group.   Stress Management:  -Group instruction provided by verbal instruction, video, and written materials to support subject matter.  Instructors review role of stress in heart disease and how to cope with stress positively.     Exercising on Your Own:  -Group instruction provided by verbal instruction, power point, and written materials to support subject.  Instructors discuss benefits of exercise, components of exercise, frequency and intensity of exercise, and end points for exercise.  Also discuss use of nitroglycerin and activating EMS.  Review options of places to exercise outside of rehab.  Review guidelines for sex with heart disease.   Cardiac Drugs I:  -Group instruction provided by verbal instruction and written materials to support subject.  Instructor reviews cardiac drug classes: antiplatelets, anticoagulants, beta blockers, and statins.  Instructor discusses reasons, side effects, and lifestyle considerations for each drug class.   Cardiac Drugs II:  -Group instruction provided by verbal instruction and written materials to support subject.  Instructor reviews cardiac drug classes: angiotensin converting enzyme inhibitors (ACE-I), angiotensin II receptor blockers (ARBs), nitrates, and calcium channel blockers.  Instructor discusses reasons, side effects, and lifestyle considerations for each drug class.   Anatomy and Physiology of the Circulatory System:  Group verbal and written instruction and models provide basic cardiac anatomy and physiology, with the coronary electrical and arterial systems. Review of: AMI, Angina,  Valve disease, Heart Failure, Peripheral Artery Disease, Cardiac Arrhythmia, Pacemakers, and the ICD.   Other Education:  -Group or individual verbal, written, or video instructions that support the educational goals of the cardiac rehab program.   Holiday Eating Survival Tips:  -Group instruction provided by PowerPoint slides, verbal discussion, and written materials to support subject matter. The instructor gives patients tips, tricks, and techniques to help them not only survive but enjoy the holidays despite the onslaught of food that accompanies the holidays.  Knowledge Questionnaire Score: Knowledge Questionnaire Score - 11/04/17 0920      Knowledge Questionnaire Score   Pre Score  22/24       Core Components/Risk Factors/Patient Goals at Admission: Personal Goals and Risk Factors at Admission - 11/04/17 1050      Core Components/Risk Factors/Patient Goals on Admission   Lipids  Yes    Intervention  Provide education and support for participant on nutrition & aerobic/resistive exercise along with prescribed medications to achieve LDL <46m, HDL >460m    Expected Outcomes  Short Term: Participant states understanding of desired cholesterol values and is compliant with medications prescribed. Participant is following exercise prescription and nutrition guidelines.;Long Term: Cholesterol controlled with medications as prescribed, with individualized exercise RX and with personalized nutrition plan. Value goals: LDL < 7087mHDL > 40 mg.    Stress  Yes    Intervention  Offer individual and/or small group education and counseling on adjustment to heart disease, stress management and health-related lifestyle change. Teach and support self-help strategies.;Refer participants experiencing significant psychosocial distress to appropriate mental health specialists for further evaluation and treatment. When possible, include family members and significant others in education/counseling  sessions.    Expected Outcomes  Short Term: Participant demonstrates changes in health-related behavior, relaxation and other stress management skills, ability to obtain effective social support, and compliance with psychotropic medications if prescribed.;Long Term: Emotional wellbeing is indicated by absence of clinically significant psychosocial distress or social isolation.       Core Components/Risk Factors/Patient Goals Review:    Core Components/Risk Factors/Patient Goals at Discharge (Final Review):    ITP Comments: ITP Comments    Row Name 11/04/17 0902           ITP Comments  Dr. TraFransico Himedical Director          Comments: LauRoneshatended orientation from [phone] review rules and guidelines for program. Completed 6 minute walk test, Intitial ITP, and exercise prescription.  VSS. Telemetry-Sinus Rhythm, downward QRS .  Asymptomatic.MarBarnet PallN,BSN 11/04/2017 12:37 PM

## 2017-11-05 NOTE — Progress Notes (Signed)
Brittany Whitney 62 y.o. female DOB: 05/08/1955 MRN: [id number]      Nutrition Note  1. 08/10/17 S/P AVR (repair) at [facility]    Past Medical History:  Diagnosis Date  . Arthritis   . Crohn's colitis ([place])   . Endometrial polyp   . History of exercise stress test 12-25-2016  by dr Einar Gip   no evidence ischemia, exercise tolerence excellent, no significant arrhythmias  . History of malignant melanoma of skin    2005  post excision back area-- localized  . Hypothyroidism   . Ileostomy in place [place])    for crohn's colitis -- external pouch  . Migraines   . Severe aortic valve regurgitation cardiologist-  dr Einar Gip--  dx by echo 2017   echo 06/ 10/2016 no change except noted mod. to sev. pulmonry htn/  verified by cardiac cath 07/ 2018 showed no evidence pulmonary htn  . Vitamin D deficiency    Meds reviewed. Biotin, calcium, cholecalciferol, DHEA, MVI  noted HT: Ht Readings from Last 1 Encounters:  11/04/17 5' 7"  (1.702 m)    WT: Wt Readings from Last 5 Encounters:  11/04/17 124 lb 9 oz (56.5 kg)  06/29/17 125 lb (56.7 kg)  03/17/17 123 lb (55.8 kg)  01/21/17 124 lb 8 oz (56.5 kg)  10/27/16 122 lb (55.3 kg)     Body mass index is 19.51 kg/m.  Current tobacco use? No      Labs:  Lipid Panel  No results found for: CHOL, TRIG, HDL, CHOLHDL, VLDL, LDLCALC, LDLDIRECT  No results found for: HGBA1C CBG (last 3)  No results for input(s): GLUCAP in the last 72 hours. Nutrition Note Spoke with pt. Nutrition plan and goals reviewed with pt. Pt is following Step 2 of the Therapeutic Lifestyle Changes diet. Pt has ileostomy, watches her sodium intake and is careful to balance. Per discussion, pt does not use canned/convenience foods often. Pt eats out infrequently, prepares most foods at home. Discussed focusing on including fruits, vegetables, whole grains, lean protein, and dairy products as part of meal plan. Pt expressed understanding of the information reviewed. Pt aware of  nutrition education classes offered and is unable to attend nutrition classes.  Nutrition Diagnosis ? Food-and nutrition-related knowledge deficit related to lack of exposure to information as related to diagnosis of: ? CVD   Nutrition Intervention ? Pt's individual nutrition plan and goals reviewed with pt. ? Pt given handouts for: ? Nutrition I class ? Nutrition II class   Nutrition Goal(s):   ? Pt to describe the benefit of including fruits, vegetables, whole grains, and low-fat dairy products in a general healthful meal plan.    Plan:  ? Pt to attend nutrition classes ? Nutrition I ? Nutrition II ? Portion Distortion  ? Will provide client-centered nutrition education as part of interdisciplinary care ? Monitor and evaluate progress toward nutrition goal with team.   Laurina Bustle, MS, RD, LDN 11/05/2017 9:40 AM

## 2017-11-08 ENCOUNTER — Encounter (HOSPITAL_COMMUNITY): Payer: BLUE CROSS/BLUE SHIELD

## 2017-11-08 ENCOUNTER — Ambulatory Visit (HOSPITAL_COMMUNITY): Payer: BLUE CROSS/BLUE SHIELD

## 2017-11-10 ENCOUNTER — Encounter (HOSPITAL_COMMUNITY)
Admission: RE | Admit: 2017-11-10 | Discharge: 2017-11-10 | Disposition: A | Discharge status: Home / Self Care | Payer: BLUE CROSS/BLUE SHIELD | Source: Ambulatory Visit | Attending: Cardiology | Admitting: Cardiology

## 2017-11-10 ENCOUNTER — Ambulatory Visit (HOSPITAL_COMMUNITY): Payer: BLUE CROSS/BLUE SHIELD

## 2017-11-10 DIAGNOSIS — F4322 Adjustment disorder with anxiety: Secondary | ICD-10-CM | POA: Diagnosis not present

## 2017-11-10 DIAGNOSIS — Z9889 Other specified postprocedural states: Secondary | ICD-10-CM | POA: Diagnosis not present

## 2017-11-10 DIAGNOSIS — Z8679 Personal history of other diseases of the circulatory system: Secondary | ICD-10-CM | POA: Diagnosis not present

## 2017-11-10 DIAGNOSIS — Z952 Presence of prosthetic heart valve: Secondary | ICD-10-CM

## 2017-11-10 DIAGNOSIS — E039 Hypothyroidism, unspecified: Secondary | ICD-10-CM | POA: Diagnosis not present

## 2017-11-10 DIAGNOSIS — Z932 Ileostomy status: Secondary | ICD-10-CM | POA: Diagnosis not present

## 2017-11-10 NOTE — Progress Notes (Signed)
Daily Session Note  Patient Details  Name: Brittany Whitney MRN: [id number] Date of Birth: 11-Jun-1955 Referring Provider:     [place] from 11/04/2017 in [place]  Referring Provider  Adrian Prows, MD      Encounter Date: 11/10/2017  Check In: Session Check In - 11/10/17 1029      Check-In   Location  [facility]    Staff Present  Luetta Nutting Fair, MS, ACSM RCEP, Exercise Physiologist;Maria Whitaker, RN, Deland Pretty, MS, ACSM CEP, Exercise Physiologist;Tara Karle Starch, RN, BSN    Supervising physician immediately available to respond to emergencies  [facility] immediately available    Physician(s)  Dr. Marthenia Rolling    Medication changes reported      No    Fall or balance concerns reported     No    Tobacco Cessation  No Change    Warm-up and Cool-down  Performed as group-led instruction    Resistance Training Performed  No    VAD Patient?  No    PAD/SET Patient?  No      Pain Assessment   Currently in Pain?  No/denies    Multiple Pain Sites  No       Capillary Blood Glucose: No results found for this or any previous visit (from the past 24 hour(s)).    Social History   Tobacco Use  Smoking Status Never Smoker  Smokeless Tobacco Never Used    Goals Met:  Exercise tolerated well  Goals Unmet:  Not Applicable  Comments: Pt started cardiac rehab today.  Pt tolerated light exercise without difficulty. VSS, telemetry-Sinus Rhythm downward QRS, asymptomatic.  Medication list reconciled. Pt denies barriers to medicaiton compliance.  PSYCHOSOCIAL ASSESSMENT:  PHQ-0. Pt exhibits positive coping skills, hopeful outlook with supportive family. No psychosocial needs identified at this time, no psychosocial interventions necessary.    Pt enjoys taking care of her family.   Pt oriented to exercise equipment and routine.    Understanding verbalized. [place] did exceed her target heart rate and was encouraged not  to drink coffee as she did today.Barnet Pall, RN,BSN 11/10/2017 10:45 AM   Dr. Fransico Him is Medical Director for Cardiac Rehab at [facility].

## 2017-11-11 DIAGNOSIS — Z23 Encounter for immunization: Secondary | ICD-10-CM | POA: Diagnosis not present

## 2017-11-12 ENCOUNTER — Ambulatory Visit (HOSPITAL_COMMUNITY): Payer: BLUE CROSS/BLUE SHIELD

## 2017-11-12 ENCOUNTER — Encounter (HOSPITAL_COMMUNITY)
Admission: RE | Admit: 2017-11-12 | Discharge: 2017-11-12 | Disposition: A | Discharge status: Home / Self Care | Payer: BLUE CROSS/BLUE SHIELD | Source: Ambulatory Visit | Attending: Cardiology | Admitting: Cardiology

## 2017-11-12 DIAGNOSIS — Z952 Presence of prosthetic heart valve: Secondary | ICD-10-CM

## 2017-11-12 DIAGNOSIS — Z932 Ileostomy status: Secondary | ICD-10-CM | POA: Diagnosis not present

## 2017-11-12 DIAGNOSIS — Z8679 Personal history of other diseases of the circulatory system: Secondary | ICD-10-CM | POA: Diagnosis not present

## 2017-11-12 DIAGNOSIS — Z9889 Other specified postprocedural states: Secondary | ICD-10-CM | POA: Diagnosis not present

## 2017-11-12 DIAGNOSIS — E039 Hypothyroidism, unspecified: Secondary | ICD-10-CM | POA: Diagnosis not present

## 2017-11-12 DIAGNOSIS — F4322 Adjustment disorder with anxiety: Secondary | ICD-10-CM | POA: Diagnosis not present

## 2017-11-15 ENCOUNTER — Encounter (HOSPITAL_COMMUNITY)
Admission: RE | Admit: 2017-11-15 | Discharge: 2017-11-15 | Disposition: A | Discharge status: Home / Self Care | Payer: BLUE CROSS/BLUE SHIELD | Source: Ambulatory Visit | Attending: Cardiology | Admitting: Cardiology

## 2017-11-15 ENCOUNTER — Ambulatory Visit (HOSPITAL_COMMUNITY): Payer: BLUE CROSS/BLUE SHIELD

## 2017-11-15 DIAGNOSIS — Z952 Presence of prosthetic heart valve: Secondary | ICD-10-CM

## 2017-11-15 DIAGNOSIS — Z8679 Personal history of other diseases of the circulatory system: Secondary | ICD-10-CM | POA: Diagnosis not present

## 2017-11-15 DIAGNOSIS — Z9889 Other specified postprocedural states: Secondary | ICD-10-CM | POA: Diagnosis not present

## 2017-11-15 DIAGNOSIS — Z932 Ileostomy status: Secondary | ICD-10-CM | POA: Diagnosis not present

## 2017-11-15 DIAGNOSIS — E039 Hypothyroidism, unspecified: Secondary | ICD-10-CM | POA: Diagnosis not present

## 2017-11-15 NOTE — Progress Notes (Signed)
Reviewed home exercise guidelines with patient including endpoints, temperature precautions, target heart rate and rate of perceived exertion. Pt plans to walk as her mode of home exercise. Pt previously participated in weight training and Pilate's, which she also plans to return to as tolerated.Pt voices understanding of instructions given. Sol Passer, MS, ACSM CEP

## 2017-11-15 NOTE — Progress Notes (Signed)
[name] 62 y.o. female Nutrition Note Spoke with pt. Nutrition plan and goals reviewed with pt. Pt is following a heart healthy diet. Pt has ileostomy, carefully watches her sodium intake. Per discussion, pt does not use canned/convenience foods often. Pt eats out infrequently, and prefer to prepare most food at home. Discussed focusing on including fruits, vegetables, whole grains, lean protein, and dairy products as part of meal plan. Set goal with patient to increase vegetable consumption by adding in a serving at lunch daily. Pt expressed understanding of the information reviewed. Pt aware of nutrition education classes offered and is unable to attend nutrition classes.    No results found for: HGBA1C  Wt Readings from Last 3 Encounters:  11/04/17 124 lb 9 oz (56.5 kg)  06/29/17 125 lb (56.7 kg)  03/17/17 123 lb (55.8 kg)    Nutrition Diagnosis ? Food-and nutrition-related knowledge deficit related to lack of exposure to information as related to diagnosis of: ? CVD    Nutrition Intervention ? Pt's individual nutrition plan reviewed with pt. ? Benefits of adopting Heart Healthy diet discussed when Medficts reviewed.     Goal(s) ? Pt to describe the potential benefits of following a Heart Healthy diet.  ? Pt to add a serving of vegetables to lunch daily  Plan:  Pt to attend nutrition classes ? Nutrition I ? Nutrition II ? Portion Distortion  Will provide client-centered nutrition education as part of interdisciplinary care.   Monitor and evaluate progress toward nutrition goal with team.   Laurina Bustle, MS, RD, LDN 11/15/2017 10:42 AM

## 2017-11-17 ENCOUNTER — Ambulatory Visit (HOSPITAL_COMMUNITY): Payer: BLUE CROSS/BLUE SHIELD

## 2017-11-17 ENCOUNTER — Encounter (HOSPITAL_COMMUNITY)
Admission: RE | Admit: 2017-11-17 | Discharge: 2017-11-17 | Disposition: A | Discharge status: Home / Self Care | Payer: BLUE CROSS/BLUE SHIELD | Source: Ambulatory Visit | Attending: Cardiology | Admitting: Cardiology

## 2017-11-17 DIAGNOSIS — E039 Hypothyroidism, unspecified: Secondary | ICD-10-CM | POA: Diagnosis not present

## 2017-11-17 DIAGNOSIS — Z952 Presence of prosthetic heart valve: Secondary | ICD-10-CM

## 2017-11-17 DIAGNOSIS — Z8679 Personal history of other diseases of the circulatory system: Secondary | ICD-10-CM | POA: Diagnosis not present

## 2017-11-17 DIAGNOSIS — Z9889 Other specified postprocedural states: Secondary | ICD-10-CM | POA: Diagnosis not present

## 2017-11-17 DIAGNOSIS — Z932 Ileostomy status: Secondary | ICD-10-CM | POA: Diagnosis not present

## 2017-11-19 ENCOUNTER — Encounter (HOSPITAL_COMMUNITY)
Admission: RE | Admit: 2017-11-19 | Discharge: 2017-11-19 | Disposition: A | Discharge status: Home / Self Care | Payer: BLUE CROSS/BLUE SHIELD | Source: Ambulatory Visit | Attending: Cardiology | Admitting: Cardiology

## 2017-11-19 ENCOUNTER — Ambulatory Visit (HOSPITAL_COMMUNITY): Payer: BLUE CROSS/BLUE SHIELD

## 2017-11-19 DIAGNOSIS — K5010 Crohn's disease of large intestine without complications: Secondary | ICD-10-CM | POA: Diagnosis not present

## 2017-11-19 DIAGNOSIS — Z79899 Other long term (current) drug therapy: Secondary | ICD-10-CM | POA: Insufficient documentation

## 2017-11-19 DIAGNOSIS — Z9889 Other specified postprocedural states: Secondary | ICD-10-CM | POA: Diagnosis not present

## 2017-11-19 DIAGNOSIS — Z79890 Hormone replacement therapy (postmenopausal): Secondary | ICD-10-CM | POA: Diagnosis not present

## 2017-11-19 DIAGNOSIS — Z8679 Personal history of other diseases of the circulatory system: Secondary | ICD-10-CM | POA: Insufficient documentation

## 2017-11-19 DIAGNOSIS — Z85820 Personal history of malignant melanoma of skin: Secondary | ICD-10-CM | POA: Insufficient documentation

## 2017-11-19 DIAGNOSIS — Z952 Presence of prosthetic heart valve: Secondary | ICD-10-CM

## 2017-11-19 DIAGNOSIS — M1990 Unspecified osteoarthritis, unspecified site: Secondary | ICD-10-CM | POA: Diagnosis not present

## 2017-11-19 DIAGNOSIS — E039 Hypothyroidism, unspecified: Secondary | ICD-10-CM | POA: Diagnosis not present

## 2017-11-19 DIAGNOSIS — Z932 Ileostomy status: Secondary | ICD-10-CM | POA: Insufficient documentation

## 2017-11-22 ENCOUNTER — Encounter (HOSPITAL_COMMUNITY)
Admission: RE | Admit: 2017-11-22 | Discharge: 2017-11-22 | Disposition: A | Discharge status: Home / Self Care | Payer: BLUE CROSS/BLUE SHIELD | Source: Ambulatory Visit | Attending: Cardiology | Admitting: Cardiology

## 2017-11-22 ENCOUNTER — Ambulatory Visit (HOSPITAL_COMMUNITY): Payer: BLUE CROSS/BLUE SHIELD

## 2017-11-22 DIAGNOSIS — K5010 Crohn's disease of large intestine without complications: Secondary | ICD-10-CM | POA: Diagnosis not present

## 2017-11-22 DIAGNOSIS — Z79890 Hormone replacement therapy (postmenopausal): Secondary | ICD-10-CM | POA: Diagnosis not present

## 2017-11-22 DIAGNOSIS — Z952 Presence of prosthetic heart valve: Secondary | ICD-10-CM

## 2017-11-22 DIAGNOSIS — M1990 Unspecified osteoarthritis, unspecified site: Secondary | ICD-10-CM | POA: Diagnosis not present

## 2017-11-22 DIAGNOSIS — Z8679 Personal history of other diseases of the circulatory system: Secondary | ICD-10-CM | POA: Diagnosis not present

## 2017-11-22 DIAGNOSIS — E039 Hypothyroidism, unspecified: Secondary | ICD-10-CM | POA: Diagnosis not present

## 2017-11-23 ENCOUNTER — Ambulatory Visit (HOSPITAL_COMMUNITY): Payer: BLUE CROSS/BLUE SHIELD

## 2017-11-23 NOTE — Progress Notes (Signed)
Cardiac Individual Treatment Plan  Patient Details  Name: Brittany Whitney MRN: [id number] Date of Birth: 20-Aug-1955 Referring Provider:     CARDIAC REHAB PHASE II ORIENTATION from 11/04/2017 in [place]  Referring Provider  Adrian Prows, MD      Initial Encounter Date:    CARDIAC REHAB PHASE II ORIENTATION from 11/04/2017 in [place]  Date  11/04/17      Visit Diagnosis: 08/10/17 S/P AVR (repair) at [place] Medications on Admission:  Current Outpatient Medications:  .  Biotin 5000 MCG TABS, Take 1 tablet by mouth daily., Disp: , Rfl:  .  Bismuth Subgallate (DEVROM) 200 MG CHEW, Chew 1 tablet by mouth 2 (two) times daily., Disp: , Rfl:  .  CALCIUM & MAGNESIUM CARBONATES PO, Take 3 tablets by mouth 2 (two) times daily. 500/500, Disp: , Rfl:  .  Cholecalciferol 5000 units capsule, Take 5,000 Units by mouth 2 (two) times daily., Disp: , Rfl:  .  DHEA 10 MG CAPS, Take 1 capsule by mouth every morning. , Disp: , Rfl:  .  Diindolylmethane POWD, Take 1 capsule by mouth 2 (two) times daily. 100 mg capsule, Disp: , Rfl:  .  doxycycline (PERIOSTAT) 20 MG tablet, Take 20 mg by mouth 2 (two) times daily., Disp: , Rfl:  .  estradiol (VIVELLE-DOT) 0.025 MG/24HR, APPLY 1 PATCH TWICE WEEKLY AS DIRECTED., Disp: , Rfl: 1 .  ibuprofen (ADVIL,MOTRIN) 200 MG tablet, Take 200 mg by mouth every 6 (six) hours as needed., Disp: , Rfl:  .  levothyroxine (SYNTHROID, LEVOTHROID) 100 MCG tablet, Take 100 mcg by mouth daily before breakfast., Disp: , Rfl:  .  meloxicam (MOBIC) 15 MG tablet, Take 1 tablet (15 mg total) by mouth daily., Disp: 90 tablet, Rfl: 2 .  Multiple Vitamin (MULTIVITAMIN WITH MINERALS) TABS, Take 1 tablet by mouth daily. , Disp: , Rfl:  .  naratriptan (AMERGE) 2.5 MG tablet, Take 2.5 mg by mouth as needed. Take one (1) tablet at onset of headache; if returns or does not resolve, may repeat after 4 hours; do  not exceed five (5) mg in 24 hours., Disp: , Rfl:  .  Omega-3 1000 MG CAPS, Take 1 capsule by mouth 2 (two) times daily., Disp: , Rfl:  .  predniSONE (DELTASONE) 5 MG tablet, Take 5 mg by mouth every other day. TAKES IN AM, Disp: , Rfl:  .  PRESCRIPTION MEDICATION, Apply 1 application topically daily. Compound cream: testosterone .015% in a 10 ml syringe. Apply 1 ml appilcation to thin skinned area(s), Disp: , Rfl:  .  progesterone (PROMETRIUM) 100 MG capsule, Take 125 mg by mouth at bedtime. COMPOUND MEDICATION IN PILL FORM, Disp: , Rfl:  .  sulfaSALAzine (AZULFIDINE) 500 MG tablet, Take 500 mg by mouth 2 (two) times daily., Disp: , Rfl:  .  SUMAtriptan (IMITREX) 50 MG tablet, Take 50 mg by mouth every 2 (two) hours as needed., Disp: , Rfl:  .  traMADol (ULTRAM) 50 MG tablet, TAKE 1 TABLET BY MOUTH TWICE A DAY (Patient taking differently: Take 50 mg by mouth daily. TAKE 1 TABLET BY MOUTH TWICE A DAY), Disp: 60 tablet, Rfl: 2 .  Vitamin D, Ergocalciferol, (DRISDOL) 50000 UNITS CAPS, Take 50,000 Units by mouth every 14 (fourteen) days. , Disp: , Rfl:  .  zolpidem (AMBIEN) 10 MG tablet, Take 5 mg by mouth at bedtime. for sleep, Disp: , Rfl: 3  Past Medical History:  Past Medical History:  Diagnosis Date  . Arthritis   . Crohn's colitis ([place])   . Endometrial polyp   . History of exercise stress test 12-25-2016  by dr Einar Gip   no evidence ischemia, exercise tolerence excellent, no significant arrhythmias  . History of malignant melanoma of skin    2005  post excision back area-- localized  . Hypothyroidism   . Ileostomy in place [facility])    for crohn's colitis -- external pouch  . Migraines   . Severe aortic valve regurgitation cardiologist-  dr Einar Gip--  dx by echo 2017   echo 06/ 10/2016 no change except noted mod. to sev. pulmonry htn/  verified by cardiac cath 07/ 2018 showed no evidence pulmonary htn  . Vitamin D deficiency     Tobacco Use: Social History   Tobacco Use  Smoking Status  Never Smoker  Smokeless Tobacco Never Used    Labs: Recent Review Flowsheet Data    Labs for ITP Cardiac and Pulmonary Rehab Latest Ref Rng & Units 10/27/2016 10/27/2016   PHART 7.350 - 7.450 - 7.579(H)   PCO2ART 32.0 - 48.0 mmHg - 22.0(L)   HCO3 20.0 - 28.0 mmol/L 22.7 20.6   TCO2 0 - 100 mmol/L 24 21   O2SAT % 70.0 99.0      Capillary Blood Glucose: No results found for: GLUCAP   Exercise Target Goals:    Exercise Program Goal: Individual exercise prescription set using results from initial 6 min walk test and THRR while considering  patient's activity barriers and safety.   Exercise Prescription Goal: Initial exercise prescription builds to 30-45 minutes a day of aerobic activity, 2-3 days per week.  Home exercise guidelines will be given to patient during program as part of exercise prescription that the participant will acknowledge.  Activity Barriers & Risk Stratification: Activity Barriers & Cardiac Risk Stratification - 11/04/17 0905      Activity Barriers & Cardiac Risk Stratification   Activity Barriers  None    Cardiac Risk Stratification  High       6 Minute Walk: 6 Minute Walk    Row Name 11/04/17 1046         6 Minute Walk   Phase  Initial     Distance  1964 feet     Walk Time  6 minutes     # of Rest Breaks  0     MPH  3.7     METS  4.9     RPE  5.1     VO2 Peak  17.3     Symptoms  No     Resting HR  81 bpm     Resting BP  98/64     Resting Oxygen Saturation   99 %     Exercise Oxygen Saturation  during 6 min walk  98 %     Max Ex. HR  123 bpm     Max Ex. BP  110/66     2 Minute Post BP  104/70        Oxygen Initial Assessment:   Oxygen Re-Evaluation:   Oxygen Discharge (Final Oxygen Re-Evaluation):   Initial Exercise Prescription: Initial Exercise Prescription - 11/04/17 1100      Date of Initial Exercise RX and Referring Provider   Date  11/04/17    Referring Provider  Adrian Prows, MD      Treadmill   MPH  3.3    Grade  1     Minutes  10    METs  3.98      Bike   Level  1    Minutes  10    METs  4.43      NuStep   Level  4    SPM  90    Minutes  10    METs  3      Prescription Details   Frequency (times per week)  3    Duration  Progress to 30 minutes of continuous aerobic without signs/symptoms of physical distress      Intensity   THRR 40-80% of Max Heartrate  63-126    Ratings of Perceived Exertion  11-13    Perceived Dyspnea  0-4      Progression   Progression  Continue to progress workloads to maintain intensity without signs/symptoms of physical distress.      Resistance Training   Training Prescription  Yes    Weight  3lbs    Reps  10-15       Perform Capillary Blood Glucose checks as needed.  Exercise Prescription Changes: Exercise Prescription Changes    Row Name 11/15/17 0950             Response to Exercise   Blood Pressure (Admit)  100/62       Blood Pressure (Exercise)  122/68       Blood Pressure (Exit)  118/62       Heart Rate (Admit)  94 bpm       Heart Rate (Exercise)  141 bpm       Heart Rate (Exit)  100 bpm       Rating of Perceived Exertion (Exercise)  12       Symptoms  none       Duration  Progress to 30 minutes of  aerobic without signs/symptoms of physical distress       Intensity  THRR unchanged         Progression   Progression  Continue to progress workloads to maintain intensity without signs/symptoms of physical distress.       Average METs  3.8         Resistance Training   Training Prescription  No         Interval Training   Interval Training  No         Treadmill   MPH  3       Grade  1       Minutes  10       METs  3.71         Bike   Level  1       Minutes  10       METs  4.35         NuStep   Level  4       SPM  90       Minutes  10       METs  3.2         Home Exercise Plan   Plans to continue exercise at  Home (comment) Patient plans to resume exercise at gym eventually.       Frequency  Add 2 additional days to  program exercise sessions.       Initial Home Exercises Provided  11/15/17          Exercise Comments: Exercise Comments    Row Name 11/15/17 1030           Exercise Comments  Reviewed home exercise guidelines, METs, and goals with patient.          Exercise Goals and Review: Exercise Goals    Row Name 11/04/17 0905             Exercise Goals   Increase Physical Activity  Yes       Intervention  Provide advice, education, support and counseling about physical activity/exercise needs.;Develop an individualized exercise prescription for aerobic and resistive training based on initial evaluation findings, risk stratification, comorbidities and participant's personal goals.       Expected Outcomes  Short Term: Attend rehab on a regular basis to increase amount of physical activity.;Long Term: Add in home exercise to make exercise part of routine and to increase amount of physical activity.;Long Term: Exercising regularly at least 3-5 days a week.       Increase Strength and Stamina  Yes       Intervention  Provide advice, education, support and counseling about physical activity/exercise needs.;Develop an individualized exercise prescription for aerobic and resistive training based on initial evaluation findings, risk stratification, comorbidities and participant's personal goals.       Expected Outcomes  Short Term: Increase workloads from initial exercise prescription for resistance, speed, and METs.;Short Term: Perform resistance training exercises routinely during rehab and add in resistance training at home;Long Term: Improve cardiorespiratory fitness, muscular endurance and strength as measured by increased METs and functional capacity (6MWT)       Able to understand and use rate of perceived exertion (RPE) scale  Yes       Intervention  Provide education and explanation on how to use RPE scale       Expected Outcomes  Short Term: Able to use RPE daily in rehab to express  subjective intensity level;Long Term:  Able to use RPE to guide intensity level when exercising independently       Knowledge and understanding of Target Heart Rate Range (THRR)  Yes       Intervention  Provide education and explanation of THRR including how the numbers were predicted and where they are located for reference       Expected Outcomes  Short Term: Able to state/look up THRR;Long Term: Able to use THRR to govern intensity when exercising independently;Short Term: Able to use daily as guideline for intensity in rehab       Able to check pulse independently  Yes       Intervention  Provide education and demonstration on how to check pulse in carotid and radial arteries.;Review the importance of being able to check your own pulse for safety during independent exercise       Expected Outcomes  Short Term: Able to explain why pulse checking is important during independent exercise;Long Term: Able to check pulse independently and accurately       Understanding of Exercise Prescription  Yes       Intervention  Provide education, explanation, and written materials on patient's individual exercise prescription       Expected Outcomes  Long Term: Able to explain home exercise prescription to exercise independently;Short Term: Able to explain program exercise prescription          Exercise Goals Re-Evaluation : Exercise Goals Re-Evaluation    Row Name 11/15/17 1030             Exercise Goal Re-Evaluation   Exercise Goals Review  Increase Physical Activity;Understanding of Exercise Prescription;Able to check pulse independently;Able to understand and use rate of  perceived exertion (RPE) scale;Knowledge and understanding of Target Heart Rate Range (THRR)       Comments  Reviewed home exercise guidelines with patient including THRR, RPE scale, and endpoints for exercise. Pt is able to monitor heart rate. Pt plans to walk as her mode of home exercise.       Expected Outcomes  Patient will walk  30 minutes 1-2 days/week in addition to exercise at cardiac rehab to help increase strength and return to previous exercise activities.           Discharge Exercise Prescription (Final Exercise Prescription Changes): Exercise Prescription Changes - 11/15/17 0950      Response to Exercise   Blood Pressure (Admit)  100/62    Blood Pressure (Exercise)  122/68    Blood Pressure (Exit)  118/62    Heart Rate (Admit)  94 bpm    Heart Rate (Exercise)  141 bpm    Heart Rate (Exit)  100 bpm    Rating of Perceived Exertion (Exercise)  12    Symptoms  none    Duration  Progress to 30 minutes of  aerobic without signs/symptoms of physical distress    Intensity  THRR unchanged      Progression   Progression  Continue to progress workloads to maintain intensity without signs/symptoms of physical distress.    Average METs  3.8      Resistance Training   Training Prescription  No      Interval Training   Interval Training  No      Treadmill   MPH  3    Grade  1    Minutes  10    METs  3.71      Bike   Level  1    Minutes  10    METs  4.35      NuStep   Level  4    SPM  90    Minutes  10    METs  3.2      Home Exercise Plan   Plans to continue exercise at  Home (comment) Patient plans to resume exercise at gym eventually.    Frequency  Add 2 additional days to program exercise sessions.    Initial Home Exercises Provided  11/15/17       Nutrition:  Target Goals: Understanding of nutrition guidelines, daily intake of sodium <1569m, cholesterol <2074m calories 30% from fat and 7% or less from saturated fats, daily to have 5 or more servings of fruits and vegetables.  Biometrics: Pre Biometrics - 11/04/17 1049      Pre Biometrics   Height  5' 7"  (1.702 m)    Weight  124 lb 9 oz (56.5 kg)    Waist Circumference  28 inches    Hip Circumference  35 inches    Waist to Hip Ratio  0.8 %    BMI (Calculated)  19.5    Triceps Skinfold  22 mm    % Body Fat  30.4 %    Grip  Strength  32 kg    Flexibility  18 in    Single Leg Stand  30 seconds        Nutrition Therapy Plan and Nutrition Goals: Nutrition Therapy & Goals - 11/05/17 0943      Nutrition Therapy   Diet  general, healthful      Personal Nutrition Goals   Nutrition Goal  Pt to describe the benefit of including fruits, vegetables, whole grains, and low-fat  dairy products in a general healthful meal plan.      Intervention Plan   Intervention  Prescribe, educate and counsel regarding individualized specific dietary modifications aiming towards targeted core components such as weight, hypertension, lipid management, diabetes, heart failure and other comorbidities.    Expected Outcomes  Short Term Goal: Understand basic principles of dietary content, such as calories, fat, sodium, cholesterol and nutrients.;Long Term Goal: Adherence to prescribed nutrition plan.       Nutrition Assessments: Nutrition Assessments - 11/05/17 0944      MEDFICTS Scores   Pre Score  23       Nutrition Goals Re-Evaluation: Nutrition Goals Re-Evaluation    Row Name 11/05/17 0944             Goals   Current Weight  124 lb 9 oz (56.5 kg)       Nutrition Goal  Pt to describe the benefit of including fruits, vegetables, whole grains, and low-fat dairy products in a general healthful meal plan.          Nutrition Goals Re-Evaluation: Nutrition Goals Re-Evaluation    [place] Name 11/05/17 0944             Goals   Current Weight  124 lb 9 oz (56.5 kg)       Nutrition Goal  Pt to describe the benefit of including fruits, vegetables, whole grains, and low-fat dairy products in a general healthful meal plan.          Nutrition Goals Discharge (Final Nutrition Goals Re-Evaluation): Nutrition Goals Re-Evaluation - 11/05/17 0944      Goals   Current Weight  124 lb 9 oz (56.5 kg)    Nutrition Goal  Pt to describe the benefit of including fruits, vegetables, whole grains, and low-fat dairy products in a general  healthful meal plan.       Psychosocial: Target Goals: Acknowledge presence or absence of significant depression and/or stress, maximize coping skills, provide positive support system. Participant is able to verbalize types and ability to use techniques and skills needed for reducing stress and depression.  Initial Review & Psychosocial Screening: Initial Psych Review & Screening - 11/04/17 0920      Initial Review   Current issues with  None Identified      Family Dynamics   Good Support System?  Yes Shonice lists her spouse, family, and friends as sources of support for her.       Barriers   Psychosocial barriers to participate in program  There are no identifiable barriers or psychosocial needs.      Screening Interventions   Interventions  Encouraged to exercise       Quality of Life Scores: Quality of Life - 11/04/17 0920      Quality of Life   Select  Quality of Life      Quality of Life Scores   Health/Function Pre  27.21 %    Socioeconomic Pre  30 %    Psych/Spiritual Pre  29.14 %    Family Pre  27.6 %    GLOBAL Pre  28.22 %      Scores of 19 and below usually indicate a poorer quality of life in these areas.  A difference of  2-3 points is a clinically meaningful difference.  A difference of 2-3 points in the total score of the Quality of Life Index has been associated with significant improvement in overall quality of life, self-image, physical symptoms, and general health in  studies assessing change in quality of life.  PHQ-9: Recent Review Flowsheet Data    Depression screen [facility] 2/9 11/10/2017 07/07/2016 03/31/2016 02/13/2016   Decreased Interest 0 0 0 0   Down, Depressed, Hopeless 0 0 0 0   PHQ - 2 Score 0 0 0 0     Interpretation of Total Score  Total Score Depression Severity:  1-4 = Minimal depression, 5-9 = Mild depression, 10-14 = Moderate depression, 15-19 = Moderately severe depression, 20-27 = Severe depression   Psychosocial Evaluation and  Intervention:   Psychosocial Re-Evaluation: Psychosocial Re-Evaluation    [place] Name 11/23/17 1446             Psychosocial Re-Evaluation   Current issues with  None Identified       Interventions  Encouraged to attend Cardiac Rehabilitation for the exercise       Continue Psychosocial Services   No Follow up required          Psychosocial Discharge (Final Psychosocial Re-Evaluation): Psychosocial Re-Evaluation - 11/23/17 1446      Psychosocial Re-Evaluation   Current issues with  None Identified    Interventions  Encouraged to attend Cardiac Rehabilitation for the exercise    Continue Psychosocial Services   No Follow up required       Vocational Rehabilitation: Provide vocational rehab assistance to qualifying candidates.   Vocational Rehab Evaluation & Intervention: Vocational Rehab - 11/04/17 1131      Initial Vocational Rehab Evaluation & Intervention   Assessment shows need for Vocational Rehabilitation  No       Education: Education Goals: Education classes will be provided on a weekly basis, covering required topics. Participant will state understanding/return demonstration of topics presented.  Learning Barriers/Preferences: Learning Barriers/Preferences - 11/04/17 0905      Learning Barriers/Preferences   Learning Barriers  None    Learning Preferences  Video;Pictoral;Written Material;Computer/Internet       Education Topics: Count Your Pulse:  -Group instruction provided by verbal instruction, demonstration, patient participation and written materials to support subject.  Instructors address importance of being able to find your pulse and how to count your pulse when at home without a heart monitor.  Patients get hands on experience counting their pulse with staff help and individually.   Heart Attack, Angina, and Risk Factor Modification:  -Group instruction provided by verbal instruction, video, and written materials to support subject.  Instructors  address signs and symptoms of angina and heart attacks.    Also discuss risk factors for heart disease and how to make changes to improve heart health risk factors.   Functional Fitness:  -Group instruction provided by verbal instruction, demonstration, patient participation, and written materials to support subject.  Instructors address safety measures for doing things around the house.  Discuss how to get up and down off the floor, how to pick things up properly, how to safely get out of a chair without assistance, and balance training.   Meditation and Mindfulness:  -Group instruction provided by verbal instruction, patient participation, and written materials to support subject.  Instructor addresses importance of mindfulness and meditation practice to help reduce stress and improve awareness.  Instructor also leads participants through a meditation exercise.    Stretching for Flexibility and Mobility:  -Group instruction provided by verbal instruction, patient participation, and written materials to support subject.  Instructors lead participants through series of stretches that are designed to increase flexibility thus improving mobility.  These stretches are additional exercise for major muscle  groups that are typically performed during regular warm up and cool down.   Hands Only CPR:  -Group verbal, video, and participation provides a basic overview of AHA guidelines for community CPR. Role-play of emergencies allow participants the opportunity to practice calling for help and chest compression technique with discussion of AED use.   Hypertension: -Group verbal and written instruction that provides a basic overview of hypertension including the most recent diagnostic guidelines, risk factor reduction with self-care instructions and medication management.    Nutrition I class: Heart Healthy Eating:  -Group instruction provided by PowerPoint slides, verbal discussion, and written  materials to support subject matter. The instructor gives an explanation and review of the Therapeutic Lifestyle Changes diet recommendations, which includes a discussion on lipid goals, dietary fat, sodium, fiber, plant stanol/sterol esters, sugar, and the components of a well-balanced, healthy diet.   Nutrition II class: Lifestyle Skills:  -Group instruction provided by PowerPoint slides, verbal discussion, and written materials to support subject matter. The instructor gives an explanation and review of label reading, grocery shopping for heart health, heart healthy recipe modifications, and ways to make healthier choices when eating out.   Diabetes Question & Answer:  -Group instruction provided by PowerPoint slides, verbal discussion, and written materials to support subject matter. The instructor gives an explanation and review of diabetes co-morbidities, pre- and post-prandial blood glucose goals, pre-exercise blood glucose goals, signs, symptoms, and treatment of hypoglycemia and hyperglycemia, and foot care basics.   Diabetes Blitz:  -Group instruction provided by PowerPoint slides, verbal discussion, and written materials to support subject matter. The instructor gives an explanation and review of the physiology behind type 1 and type 2 diabetes, diabetes medications and rational behind using different medications, pre- and post-prandial blood glucose recommendations and Hemoglobin A1c goals, diabetes diet, and exercise including blood glucose guidelines for exercising safely.    Portion Distortion:  -Group instruction provided by PowerPoint slides, verbal discussion, written materials, and food models to support subject matter. The instructor gives an explanation of serving size versus portion size, changes in portions sizes over the last 20 years, and what consists of a serving from each food group.   Stress Management:  -Group instruction provided by verbal instruction, video, and  written materials to support subject matter.  Instructors review role of stress in heart disease and how to cope with stress positively.     CARDIAC REHAB PHASE II EXERCISE from 11/10/2017 in [place]  Date  11/10/17  Educator  RN  Instruction Review Code  2- Demonstrated Understanding      Exercising on Your Own:  -Group instruction provided by verbal instruction, power point, and written materials to support subject.  Instructors discuss benefits of exercise, components of exercise, frequency and intensity of exercise, and end points for exercise.  Also discuss use of nitroglycerin and activating EMS.  Review options of places to exercise outside of rehab.  Review guidelines for sex with heart disease.   Cardiac Drugs I:  -Group instruction provided by verbal instruction and written materials to support subject.  Instructor reviews cardiac drug classes: antiplatelets, anticoagulants, beta blockers, and statins.  Instructor discusses reasons, side effects, and lifestyle considerations for each drug class.   Cardiac Drugs II:  -Group instruction provided by verbal instruction and written materials to support subject.  Instructor reviews cardiac drug classes: angiotensin converting enzyme inhibitors (ACE-I), angiotensin II receptor blockers (ARBs), nitrates, and calcium channel blockers.  Instructor discusses reasons, side effects, and  lifestyle considerations for each drug class.   Anatomy and Physiology of the Circulatory System:  Group verbal and written instruction and models provide basic cardiac anatomy and physiology, with the coronary electrical and arterial systems. Review of: AMI, Angina, Valve disease, Heart Failure, Peripheral Artery Disease, Cardiac Arrhythmia, Pacemakers, and the ICD.   Other Education:  -Group or individual verbal, written, or video instructions that support the educational goals of the cardiac rehab program.   Holiday Eating  Survival Tips:  -Group instruction provided by PowerPoint slides, verbal discussion, and written materials to support subject matter. The instructor gives patients tips, tricks, and techniques to help them not only survive but enjoy the holidays despite the onslaught of food that accompanies the holidays.   Knowledge Questionnaire Score: Knowledge Questionnaire Score - 11/04/17 0920      Knowledge Questionnaire Score   Pre Score  22/24       Core Components/Risk Factors/Patient Goals at Admission: Personal Goals and Risk Factors at Admission - 11/04/17 1050      Core Components/Risk Factors/Patient Goals on Admission   Lipids  Yes    Intervention  Provide education and support for participant on nutrition & aerobic/resistive exercise along with prescribed medications to achieve LDL <44m, HDL >49m    Expected Outcomes  Short Term: Participant states understanding of desired cholesterol values and is compliant with medications prescribed. Participant is following exercise prescription and nutrition guidelines.;Long Term: Cholesterol controlled with medications as prescribed, with individualized exercise RX and with personalized nutrition plan. Value goals: LDL < 7092mHDL > 40 mg.    Stress  Yes    Intervention  Offer individual and/or small group education and counseling on adjustment to heart disease, stress management and health-related lifestyle change. Teach and support self-help strategies.;Refer participants experiencing significant psychosocial distress to appropriate mental health specialists for further evaluation and treatment. When possible, include family members and significant others in education/counseling sessions.    Expected Outcomes  Short Term: Participant demonstrates changes in health-related behavior, relaxation and other stress management skills, ability to obtain effective social support, and compliance with psychotropic medications if prescribed.;Long Term: Emotional  wellbeing is indicated by absence of clinically significant psychosocial distress or social isolation.       Core Components/Risk Factors/Patient Goals Review:  Goals and Risk Factor Review    Row Name 11/10/17 1042 11/23/17 1442           Core Components/Risk Factors/Patient Goals Review   Personal Goals Review  Hypertension;Lipids  Hypertension;Lipids      Review  -  Jersey's vital signs have been stable at cardiac rehab. LauFlorences maintained his current weight. LauMaribellas not voiced any concerns about any additional stressors.      Expected Outcomes  -  LauShaleenall contine to participate in phase 2 cardiac rehab, follow nutrition and lifestyle modifications.         Core Components/Risk Factors/Patient Goals at Discharge (Final Review):  Goals and Risk Factor Review - 11/23/17 1442      Core Components/Risk Factors/Patient Goals Review   Personal Goals Review  Hypertension;Lipids    Review  Tinzlee's vital signs have been stable at cardiac rehab. LauMagdalynns maintained his current weight. LauJassicas not voiced any concerns about any additional stressors.    Expected Outcomes  LauSemajll contine to participate in phase 2 cardiac rehab, follow nutrition and lifestyle modifications.       ITP Comments: ITP Comments    Row Name 11/04/17 [id number]/10/06 1441  ITP Comments  Dr. Fransico Him, Medical Director  30 Day ITP Review. Patient with good attendance and participation in phase 2 cardiac rehab         Comments: See ITP comments.Barnet Pall, RN,BSN 11/23/2017 2:49 PM

## 2017-11-24 ENCOUNTER — Ambulatory Visit (HOSPITAL_COMMUNITY): Payer: BLUE CROSS/BLUE SHIELD

## 2017-11-24 ENCOUNTER — Encounter (HOSPITAL_COMMUNITY)
Admission: RE | Admit: 2017-11-24 | Discharge: 2017-11-24 | Disposition: A | Discharge status: Home / Self Care | Payer: BLUE CROSS/BLUE SHIELD | Source: Ambulatory Visit | Attending: Cardiology | Admitting: Cardiology

## 2017-11-24 DIAGNOSIS — M1990 Unspecified osteoarthritis, unspecified site: Secondary | ICD-10-CM | POA: Diagnosis not present

## 2017-11-24 DIAGNOSIS — Z79890 Hormone replacement therapy (postmenopausal): Secondary | ICD-10-CM | POA: Diagnosis not present

## 2017-11-24 DIAGNOSIS — Z8679 Personal history of other diseases of the circulatory system: Secondary | ICD-10-CM | POA: Diagnosis not present

## 2017-11-24 DIAGNOSIS — Z952 Presence of prosthetic heart valve: Secondary | ICD-10-CM

## 2017-11-24 DIAGNOSIS — K5010 Crohn's disease of large intestine without complications: Secondary | ICD-10-CM | POA: Diagnosis not present

## 2017-11-24 DIAGNOSIS — E039 Hypothyroidism, unspecified: Secondary | ICD-10-CM | POA: Diagnosis not present

## 2017-11-25 DIAGNOSIS — F4322 Adjustment disorder with anxiety: Secondary | ICD-10-CM | POA: Diagnosis not present

## 2017-11-26 ENCOUNTER — Ambulatory Visit (HOSPITAL_COMMUNITY): Payer: BLUE CROSS/BLUE SHIELD

## 2017-11-26 ENCOUNTER — Encounter (HOSPITAL_COMMUNITY): Payer: BLUE CROSS/BLUE SHIELD

## 2017-11-29 ENCOUNTER — Ambulatory Visit (HOSPITAL_COMMUNITY): Payer: BLUE CROSS/BLUE SHIELD

## 2017-11-29 ENCOUNTER — Encounter (HOSPITAL_COMMUNITY)
Admission: RE | Admit: 2017-11-29 | Discharge: 2017-11-29 | Disposition: A | Discharge status: Home / Self Care | Payer: BLUE CROSS/BLUE SHIELD | Source: Ambulatory Visit | Attending: Cardiology | Admitting: Cardiology

## 2017-11-29 DIAGNOSIS — Z952 Presence of prosthetic heart valve: Secondary | ICD-10-CM

## 2017-11-29 DIAGNOSIS — K5010 Crohn's disease of large intestine without complications: Secondary | ICD-10-CM | POA: Diagnosis not present

## 2017-11-29 DIAGNOSIS — E039 Hypothyroidism, unspecified: Secondary | ICD-10-CM | POA: Diagnosis not present

## 2017-11-29 DIAGNOSIS — Z8679 Personal history of other diseases of the circulatory system: Secondary | ICD-10-CM | POA: Diagnosis not present

## 2017-11-29 DIAGNOSIS — M1990 Unspecified osteoarthritis, unspecified site: Secondary | ICD-10-CM | POA: Diagnosis not present

## 2017-11-29 DIAGNOSIS — Z79890 Hormone replacement therapy (postmenopausal): Secondary | ICD-10-CM | POA: Diagnosis not present

## 2017-12-01 ENCOUNTER — Ambulatory Visit (HOSPITAL_COMMUNITY): Payer: BLUE CROSS/BLUE SHIELD

## 2017-12-01 ENCOUNTER — Encounter (HOSPITAL_COMMUNITY)
Admission: RE | Admit: 2017-12-01 | Discharge: 2017-12-01 | Disposition: A | Discharge status: Home / Self Care | Payer: BLUE CROSS/BLUE SHIELD | Source: Ambulatory Visit | Attending: Cardiology | Admitting: Cardiology

## 2017-12-01 DIAGNOSIS — M1990 Unspecified osteoarthritis, unspecified site: Secondary | ICD-10-CM | POA: Diagnosis not present

## 2017-12-01 DIAGNOSIS — Z952 Presence of prosthetic heart valve: Secondary | ICD-10-CM

## 2017-12-01 DIAGNOSIS — Z79890 Hormone replacement therapy (postmenopausal): Secondary | ICD-10-CM | POA: Diagnosis not present

## 2017-12-01 DIAGNOSIS — K5010 Crohn's disease of large intestine without complications: Secondary | ICD-10-CM | POA: Diagnosis not present

## 2017-12-01 DIAGNOSIS — E039 Hypothyroidism, unspecified: Secondary | ICD-10-CM | POA: Diagnosis not present

## 2017-12-01 DIAGNOSIS — Z8679 Personal history of other diseases of the circulatory system: Secondary | ICD-10-CM | POA: Diagnosis not present

## 2017-12-03 ENCOUNTER — Ambulatory Visit (HOSPITAL_COMMUNITY): Payer: BLUE CROSS/BLUE SHIELD

## 2017-12-03 ENCOUNTER — Encounter (HOSPITAL_COMMUNITY): Payer: BLUE CROSS/BLUE SHIELD

## 2017-12-06 ENCOUNTER — Ambulatory Visit (HOSPITAL_COMMUNITY): Payer: BLUE CROSS/BLUE SHIELD

## 2017-12-06 ENCOUNTER — Encounter (HOSPITAL_COMMUNITY)
Admission: RE | Admit: 2017-12-06 | Discharge: 2017-12-06 | Disposition: A | Discharge status: Home / Self Care | Payer: BLUE CROSS/BLUE SHIELD | Source: Ambulatory Visit | Attending: Cardiology | Admitting: Cardiology

## 2017-12-06 DIAGNOSIS — Z79890 Hormone replacement therapy (postmenopausal): Secondary | ICD-10-CM | POA: Diagnosis not present

## 2017-12-06 DIAGNOSIS — K5010 Crohn's disease of large intestine without complications: Secondary | ICD-10-CM | POA: Diagnosis not present

## 2017-12-06 DIAGNOSIS — M1990 Unspecified osteoarthritis, unspecified site: Secondary | ICD-10-CM | POA: Diagnosis not present

## 2017-12-06 DIAGNOSIS — Z952 Presence of prosthetic heart valve: Secondary | ICD-10-CM

## 2017-12-06 DIAGNOSIS — Z8679 Personal history of other diseases of the circulatory system: Secondary | ICD-10-CM | POA: Diagnosis not present

## 2017-12-06 DIAGNOSIS — E039 Hypothyroidism, unspecified: Secondary | ICD-10-CM | POA: Diagnosis not present

## 2017-12-08 ENCOUNTER — Ambulatory Visit (HOSPITAL_COMMUNITY): Payer: BLUE CROSS/BLUE SHIELD

## 2017-12-08 ENCOUNTER — Encounter (HOSPITAL_COMMUNITY)
Admission: RE | Admit: 2017-12-08 | Discharge: 2017-12-08 | Disposition: A | Discharge status: Home / Self Care | Payer: BLUE CROSS/BLUE SHIELD | Source: Ambulatory Visit | Attending: Cardiology | Admitting: Cardiology

## 2017-12-08 DIAGNOSIS — Z952 Presence of prosthetic heart valve: Secondary | ICD-10-CM

## 2017-12-08 DIAGNOSIS — K5010 Crohn's disease of large intestine without complications: Secondary | ICD-10-CM | POA: Diagnosis not present

## 2017-12-08 DIAGNOSIS — E039 Hypothyroidism, unspecified: Secondary | ICD-10-CM | POA: Diagnosis not present

## 2017-12-08 DIAGNOSIS — Z8679 Personal history of other diseases of the circulatory system: Secondary | ICD-10-CM | POA: Diagnosis not present

## 2017-12-08 DIAGNOSIS — Z79890 Hormone replacement therapy (postmenopausal): Secondary | ICD-10-CM | POA: Diagnosis not present

## 2017-12-08 DIAGNOSIS — M1990 Unspecified osteoarthritis, unspecified site: Secondary | ICD-10-CM | POA: Diagnosis not present

## 2017-12-10 ENCOUNTER — Encounter (HOSPITAL_COMMUNITY): Payer: BLUE CROSS/BLUE SHIELD

## 2017-12-10 ENCOUNTER — Ambulatory Visit (HOSPITAL_COMMUNITY): Payer: BLUE CROSS/BLUE SHIELD

## 2017-12-13 ENCOUNTER — Ambulatory Visit (HOSPITAL_COMMUNITY): Payer: BLUE CROSS/BLUE SHIELD

## 2017-12-13 ENCOUNTER — Encounter (HOSPITAL_COMMUNITY): Payer: BLUE CROSS/BLUE SHIELD

## 2017-12-15 ENCOUNTER — Ambulatory Visit (HOSPITAL_COMMUNITY): Payer: BLUE CROSS/BLUE SHIELD

## 2017-12-15 ENCOUNTER — Encounter (HOSPITAL_COMMUNITY): Payer: BLUE CROSS/BLUE SHIELD

## 2017-12-15 DIAGNOSIS — F4322 Adjustment disorder with anxiety: Secondary | ICD-10-CM | POA: Diagnosis not present

## 2017-12-17 ENCOUNTER — Encounter (HOSPITAL_COMMUNITY): Payer: BLUE CROSS/BLUE SHIELD

## 2017-12-17 ENCOUNTER — Ambulatory Visit (HOSPITAL_COMMUNITY): Payer: BLUE CROSS/BLUE SHIELD

## 2017-12-22 ENCOUNTER — Ambulatory Visit (HOSPITAL_COMMUNITY): Payer: BLUE CROSS/BLUE SHIELD

## 2017-12-22 ENCOUNTER — Encounter (HOSPITAL_COMMUNITY)
Admission: RE | Admit: 2017-12-22 | Discharge: 2017-12-22 | Disposition: A | Discharge status: Home / Self Care | Payer: BLUE CROSS/BLUE SHIELD | Source: Ambulatory Visit | Attending: Interventional Cardiology | Admitting: Interventional Cardiology

## 2017-12-22 DIAGNOSIS — Z79890 Hormone replacement therapy (postmenopausal): Secondary | ICD-10-CM | POA: Insufficient documentation

## 2017-12-22 DIAGNOSIS — Z9889 Other specified postprocedural states: Secondary | ICD-10-CM | POA: Insufficient documentation

## 2017-12-22 DIAGNOSIS — Z932 Ileostomy status: Secondary | ICD-10-CM | POA: Insufficient documentation

## 2017-12-22 DIAGNOSIS — Z85820 Personal history of malignant melanoma of skin: Secondary | ICD-10-CM | POA: Insufficient documentation

## 2017-12-22 DIAGNOSIS — E039 Hypothyroidism, unspecified: Secondary | ICD-10-CM | POA: Insufficient documentation

## 2017-12-22 DIAGNOSIS — Z8679 Personal history of other diseases of the circulatory system: Secondary | ICD-10-CM | POA: Diagnosis not present

## 2017-12-22 DIAGNOSIS — Z79899 Other long term (current) drug therapy: Secondary | ICD-10-CM | POA: Insufficient documentation

## 2017-12-22 DIAGNOSIS — Z952 Presence of prosthetic heart valve: Secondary | ICD-10-CM

## 2017-12-22 DIAGNOSIS — K5010 Crohn's disease of large intestine without complications: Secondary | ICD-10-CM | POA: Insufficient documentation

## 2017-12-22 DIAGNOSIS — F4322 Adjustment disorder with anxiety: Secondary | ICD-10-CM | POA: Diagnosis not present

## 2017-12-22 DIAGNOSIS — M1990 Unspecified osteoarthritis, unspecified site: Secondary | ICD-10-CM | POA: Diagnosis not present

## 2017-12-23 DIAGNOSIS — Z298 Encounter for other specified prophylactic measures: Secondary | ICD-10-CM | POA: Diagnosis not present

## 2017-12-23 DIAGNOSIS — Z1329 Encounter for screening for other suspected endocrine disorder: Secondary | ICD-10-CM | POA: Diagnosis not present

## 2017-12-23 DIAGNOSIS — E559 Vitamin D deficiency, unspecified: Secondary | ICD-10-CM | POA: Diagnosis not present

## 2017-12-23 DIAGNOSIS — Z9889 Other specified postprocedural states: Secondary | ICD-10-CM | POA: Diagnosis not present

## 2017-12-23 DIAGNOSIS — Z114 Encounter for screening for human immunodeficiency virus [HIV]: Secondary | ICD-10-CM | POA: Diagnosis not present

## 2017-12-23 DIAGNOSIS — Z13220 Encounter for screening for lipoid disorders: Secondary | ICD-10-CM | POA: Diagnosis not present

## 2017-12-23 DIAGNOSIS — Z0189 Encounter for other specified special examinations: Secondary | ICD-10-CM | POA: Diagnosis not present

## 2017-12-23 DIAGNOSIS — Z0000 Encounter for general adult medical examination without abnormal findings: Secondary | ICD-10-CM | POA: Diagnosis not present

## 2017-12-23 NOTE — Progress Notes (Signed)
Cardiac Individual Treatment Plan  Patient Details  Name: Brittany Whitney MRN: [id number] Date of Birth: May 14, 1955 Referring Provider:     CARDIAC REHAB PHASE II ORIENTATION from 11/04/2017 in [facility]  Referring Provider  Adrian Prows, MD      Initial Encounter Date:    CARDIAC REHAB PHASE II ORIENTATION from 11/04/2017 in [facility]  Date  11/04/17      Visit Diagnosis: 08/10/17 S/P AVR (repair) at [place] Medications on Admission:  Current Outpatient Medications:  .  Biotin 5000 MCG TABS, Take 1 tablet by mouth daily., Disp: , Rfl:  .  Bismuth Subgallate (DEVROM) 200 MG CHEW, Chew 1 tablet by mouth 2 (two) times daily., Disp: , Rfl:  .  CALCIUM & MAGNESIUM CARBONATES PO, Take 3 tablets by mouth 2 (two) times daily. 500/500, Disp: , Rfl:  .  Cholecalciferol 5000 units capsule, Take 5,000 Units by mouth 2 (two) times daily., Disp: , Rfl:  .  DHEA 10 MG CAPS, Take 1 capsule by mouth every morning. , Disp: , Rfl:  .  Diindolylmethane POWD, Take 1 capsule by mouth 2 (two) times daily. 100 mg capsule, Disp: , Rfl:  .  doxycycline (PERIOSTAT) 20 MG tablet, Take 20 mg by mouth 2 (two) times daily., Disp: , Rfl:  .  estradiol (VIVELLE-DOT) 0.025 MG/24HR, APPLY 1 PATCH TWICE WEEKLY AS DIRECTED., Disp: , Rfl: 1 .  ibuprofen (ADVIL,MOTRIN) 200 MG tablet, Take 200 mg by mouth every 6 (six) hours as needed., Disp: , Rfl:  .  levothyroxine (SYNTHROID, LEVOTHROID) 100 MCG tablet, Take 100 mcg by mouth daily before breakfast., Disp: , Rfl:  .  meloxicam (MOBIC) 15 MG tablet, Take 1 tablet (15 mg total) by mouth daily., Disp: 90 tablet, Rfl: 2 .  Multiple Vitamin (MULTIVITAMIN WITH MINERALS) TABS, Take 1 tablet by mouth daily. , Disp: , Rfl:  .  naratriptan (AMERGE) 2.5 MG tablet, Take 2.5 mg by mouth as needed. Take one (1) tablet at onset of headache; if returns or does not resolve, may repeat after 4 hours; do  not exceed five (5) mg in 24 hours., Disp: , Rfl:  .  Omega-3 1000 MG CAPS, Take 1 capsule by mouth 2 (two) times daily., Disp: , Rfl:  .  predniSONE (DELTASONE) 5 MG tablet, Take 5 mg by mouth every other day. TAKES IN AM, Disp: , Rfl:  .  PRESCRIPTION MEDICATION, Apply 1 application topically daily. Compound cream: testosterone .015% in a 10 ml syringe. Apply 1 ml appilcation to thin skinned area(s), Disp: , Rfl:  .  progesterone (PROMETRIUM) 100 MG capsule, Take 125 mg by mouth at bedtime. COMPOUND MEDICATION IN PILL FORM, Disp: , Rfl:  .  sulfaSALAzine (AZULFIDINE) 500 MG tablet, Take 500 mg by mouth 2 (two) times daily., Disp: , Rfl:  .  SUMAtriptan (IMITREX) 50 MG tablet, Take 50 mg by mouth every 2 (two) hours as needed., Disp: , Rfl:  .  traMADol (ULTRAM) 50 MG tablet, TAKE 1 TABLET BY MOUTH TWICE A DAY (Patient taking differently: Take 50 mg by mouth daily. TAKE 1 TABLET BY MOUTH TWICE A DAY), Disp: 60 tablet, Rfl: 2 .  Vitamin D, Ergocalciferol, (DRISDOL) 50000 UNITS CAPS, Take 50,000 Units by mouth every 14 (fourteen) days. , Disp: , Rfl:  .  zolpidem (AMBIEN) 10 MG tablet, Take 5 mg by mouth at bedtime. for sleep, Disp: , Rfl: 3  Past Medical History:  Past Medical History:  Diagnosis Date  . Arthritis   . Crohn's colitis ([place])   . Endometrial polyp   . History of exercise stress test 12-25-2016  by dr Einar Gip   no evidence ischemia, exercise tolerence excellent, no significant arrhythmias  . History of malignant melanoma of skin    2005  post excision back area-- localized  . Hypothyroidism   . Ileostomy in place [facility])    for crohn's colitis -- external pouch  . Migraines   . Severe aortic valve regurgitation cardiologist-  dr Einar Gip--  dx by echo 2017   echo 06/ 10/2016 no change except noted mod. to sev. pulmonry htn/  verified by cardiac cath 07/ 2018 showed no evidence pulmonary htn  . Vitamin D deficiency     Tobacco Use: Social History   Tobacco Use  Smoking Status  Never Smoker  Smokeless Tobacco Never Used    Labs: Recent Review Flowsheet Data    Labs for ITP Cardiac and Pulmonary Rehab Latest Ref Rng & Units 10/27/2016 10/27/2016   PHART 7.350 - 7.450 - 7.579(H)   PCO2ART 32.0 - 48.0 mmHg - 22.0(L)   HCO3 20.0 - 28.0 mmol/L 22.7 20.6   TCO2 0 - 100 mmol/L 24 21   O2SAT % 70.0 99.0      Capillary Blood Glucose: No results found for: GLUCAP   Exercise Target Goals: Exercise Program Goal: Individual exercise prescription set using results from initial 6 min walk test and THRR while considering  patient's activity barriers and safety.   Exercise Prescription Goal: Initial exercise prescription builds to 30-45 minutes a day of aerobic activity, 2-3 days per week.  Home exercise guidelines will be given to patient during program as part of exercise prescription that the participant will acknowledge.  Activity Barriers & Risk Stratification: Activity Barriers & Cardiac Risk Stratification - 11/04/17 0905      Activity Barriers & Cardiac Risk Stratification   Activity Barriers  None    Cardiac Risk Stratification  High       6 Minute Walk: 6 Minute Walk    Row Name 11/04/17 1046         6 Minute Walk   Phase  Initial     Distance  1964 feet     Walk Time  6 minutes     # of Rest Breaks  0     MPH  3.7     METS  4.9     RPE  5.1     VO2 Peak  17.3     Symptoms  No     Resting HR  81 bpm     Resting BP  98/64     Resting Oxygen Saturation   99 %     Exercise Oxygen Saturation  during 6 min walk  98 %     Max Ex. HR  123 bpm     Max Ex. BP  110/66     2 Minute Post BP  104/70        Oxygen Initial Assessment:   Oxygen Re-Evaluation:   Oxygen Discharge (Final Oxygen Re-Evaluation):   Initial Exercise Prescription: Initial Exercise Prescription - 11/04/17 1100      Date of Initial Exercise RX and Referring Provider   Date  11/04/17    Referring Provider  Adrian Prows, MD      Treadmill   MPH  3.3    Grade  1     Minutes  10  METs  3.98      Bike   Level  1    Minutes  10    METs  4.43      NuStep   Level  4    SPM  90    Minutes  10    METs  3      Prescription Details   Frequency (times per week)  3    Duration  Progress to 30 minutes of continuous aerobic without signs/symptoms of physical distress      Intensity   THRR 40-80% of Max Heartrate  63-126    Ratings of Perceived Exertion  11-13    Perceived Dyspnea  0-4      Progression   Progression  Continue to progress workloads to maintain intensity without signs/symptoms of physical distress.      Resistance Training   Training Prescription  Yes    Weight  3lbs    Reps  10-15       Perform Capillary Blood Glucose checks as needed.  Exercise Prescription Changes: Exercise Prescription Changes    Row Name 11/15/17 0950 11/29/17 0955 12/08/17 0955 12/22/17 0955       Response to Exercise   Blood Pressure (Admit)  100/62  114/72  102/80  124/80    Blood Pressure (Exercise)  122/68  108/72  112/72  120/70    Blood Pressure (Exit)  118/62  110/78  106/78  102/68    Heart Rate (Admit)  94 bpm  92 bpm  98 bpm  99 bpm    Heart Rate (Exercise)  141 bpm  140 bpm  131 bpm  129 bpm    Heart Rate (Exit)  100 bpm  90 bpm  74 bpm  99 bpm    Rating of Perceived Exertion (Exercise)  12  12  12  12     Symptoms  none  none  none  none    Duration  Progress to 30 minutes of  aerobic without signs/symptoms of physical distress  Progress to 30 minutes of  aerobic without signs/symptoms of physical distress  Progress to 30 minutes of  aerobic without signs/symptoms of physical distress  Progress to 30 minutes of  aerobic without signs/symptoms of physical distress    Intensity  THRR unchanged  THRR unchanged  THRR unchanged  THRR unchanged      Progression   Progression  Continue to progress workloads to maintain intensity without signs/symptoms of physical distress.  Continue to progress workloads to maintain intensity without  signs/symptoms of physical distress.  Continue to progress workloads to maintain intensity without signs/symptoms of physical distress.  Continue to progress workloads to maintain intensity without signs/symptoms of physical distress.    Average METs  3.8  3.9  4.1  4.4      Resistance Training   Training Prescription  No  Yes  No Relaxation day, no weights.  No Relaxation day, no weights.    Weight  -  4lbs  -  -    Reps  -  10-15  -  -    Time  -  10 Minutes  -  -      Interval Training   Interval Training  No  No  No  No      Treadmill   MPH  3  3  3.2  3.2    Grade  1  1  2  2     Minutes  10  10  10  10    METs  3.71  3.71  4.33  4.33      Bike   Level  1  1  1  1     Minutes  10  10  10  10     METs  4.35  4.33  4.38  4.38      NuStep   Level  4  4  4  4     SPM  90  90  90  90    Minutes  10  10  10  10     METs  3.2  3.6  3.5  -      Home Exercise Plan   Plans to continue exercise at  Home (comment) Patient plans to resume exercise at gym eventually.  Home (comment) Patient plans to resume exercise at gym eventually.  Home (comment) Patient plans to resume exercise at gym eventually.  Home (comment) Patient plans to resume exercise at gym eventually.    Frequency  Add 2 additional days to program exercise sessions.  Add 2 additional days to program exercise sessions.  Add 2 additional days to program exercise sessions.  Add 2 additional days to program exercise sessions.    Initial Home Exercises Provided  11/15/17  11/15/17  11/15/17  11/15/17       Exercise Comments: Exercise Comments    Row Name 11/15/17 1030 12/22/17 1030         Exercise Comments  Reviewed home exercise guidelines, METs, and goals with patient.  Patient returned today after being out for personal reasons. Tolerated exercise well without c/o.         Exercise Goals and Review: Exercise Goals    Row Name 11/04/17 0905             Exercise Goals   Increase Physical Activity  Yes        Intervention  Provide advice, education, support and counseling about physical activity/exercise needs.;Develop an individualized exercise prescription for aerobic and resistive training based on initial evaluation findings, risk stratification, comorbidities and participant's personal goals.       Expected Outcomes  Short Term: Attend rehab on a regular basis to increase amount of physical activity.;Long Term: Add in home exercise to make exercise part of routine and to increase amount of physical activity.;Long Term: Exercising regularly at least 3-5 days a week.       Increase Strength and Stamina  Yes       Intervention  Provide advice, education, support and counseling about physical activity/exercise needs.;Develop an individualized exercise prescription for aerobic and resistive training based on initial evaluation findings, risk stratification, comorbidities and participant's personal goals.       Expected Outcomes  Short Term: Increase workloads from initial exercise prescription for resistance, speed, and METs.;Short Term: Perform resistance training exercises routinely during rehab and add in resistance training at home;Long Term: Improve cardiorespiratory fitness, muscular endurance and strength as measured by increased METs and functional capacity (6MWT)       Able to understand and use rate of perceived exertion (RPE) scale  Yes       Intervention  Provide education and explanation on how to use RPE scale       Expected Outcomes  Short Term: Able to use RPE daily in rehab to express subjective intensity level;Long Term:  Able to use RPE to guide intensity level when exercising independently       Knowledge and understanding of Target Heart Rate Range (THRR)  Yes       Intervention  Provide education and explanation of THRR including how the numbers were predicted and where they are located for reference       Expected Outcomes  Short Term: Able to state/look up THRR;Long Term: Able to use  THRR to govern intensity when exercising independently;Short Term: Able to use daily as guideline for intensity in rehab       Able to check pulse independently  Yes       Intervention  Provide education and demonstration on how to check pulse in carotid and radial arteries.;Review the importance of being able to check your own pulse for safety during independent exercise       Expected Outcomes  Short Term: Able to explain why pulse checking is important during independent exercise;Long Term: Able to check pulse independently and accurately       Understanding of Exercise Prescription  Yes       Intervention  Provide education, explanation, and written materials on patient's individual exercise prescription       Expected Outcomes  Long Term: Able to explain home exercise prescription to exercise independently;Short Term: Able to explain program exercise prescription          Exercise Goals Re-Evaluation : Exercise Goals Re-Evaluation    Row Name 11/15/17 1030 12/22/17 1030           Exercise Goal Re-Evaluation   Exercise Goals Review  Increase Physical Activity;Understanding of Exercise Prescription;Able to check pulse independently;Able to understand and use rate of perceived exertion (RPE) scale;Knowledge and understanding of Target Heart Rate Range (THRR)  Increase Physical Activity      Comments  Reviewed home exercise guidelines with patient including THRR, RPE scale, and endpoints for exercise. Pt is able to monitor heart rate. Pt plans to walk as her mode of home exercise.  Patient returned today after being out the past week. Pt states she's been doing a lot of walking.      Expected Outcomes  Patient will walk 30 minutes 1-2 days/week in addition to exercise at cardiac rehab to help increase strength and return to previous exercise activities.  Increase workloads as tolerated.         Discharge Exercise Prescription (Final Exercise Prescription Changes): Exercise Prescription  Changes - 12/22/17 0955      Response to Exercise   Blood Pressure (Admit)  124/80    Blood Pressure (Exercise)  120/70    Blood Pressure (Exit)  102/68    Heart Rate (Admit)  99 bpm    Heart Rate (Exercise)  129 bpm    Heart Rate (Exit)  99 bpm    Rating of Perceived Exertion (Exercise)  12    Symptoms  none    Duration  Progress to 30 minutes of  aerobic without signs/symptoms of physical distress    Intensity  THRR unchanged      Progression   Progression  Continue to progress workloads to maintain intensity without signs/symptoms of physical distress.    Average METs  4.4      Resistance Training   Training Prescription  No   Relaxation day, no weights.     Interval Training   Interval Training  No      Treadmill   MPH  3.2    Grade  2    Minutes  10    METs  4.33      Bike   Level  1    Minutes  10    METs  4.38      NuStep   Level  4    SPM  90    Minutes  10      Home Exercise Plan   Plans to continue exercise at  Home (comment)   Patient plans to resume exercise at gym eventually.   Frequency  Add 2 additional days to program exercise sessions.    Initial Home Exercises Provided  11/15/17       Nutrition:  Target Goals: Understanding of nutrition guidelines, daily intake of sodium <1527m, cholesterol <2027m calories 30% from fat and 7% or less from saturated fats, daily to have 5 or more servings of fruits and vegetables.  Biometrics: Pre Biometrics - 11/04/17 1049      Pre Biometrics   Height  5' 7"  (1.702 m)    Weight  56.5 kg    Waist Circumference  28 inches    Hip Circumference  35 inches    Waist to Hip Ratio  0.8 %    BMI (Calculated)  19.5    Triceps Skinfold  22 mm    % Body Fat  30.4 %    Grip Strength  32 kg    Flexibility  18 in    Single Leg Stand  30 seconds        Nutrition Therapy Plan and Nutrition Goals: Nutrition Therapy & Goals - 11/05/17 0943      Nutrition Therapy   Diet  general, healthful      Personal  Nutrition Goals   Nutrition Goal  Pt to describe the benefit of including fruits, vegetables, whole grains, and low-fat dairy products in a general healthful meal plan.      Intervention Plan   Intervention  Prescribe, educate and counsel regarding individualized specific dietary modifications aiming towards targeted core components such as weight, hypertension, lipid management, diabetes, heart failure and other comorbidities.    Expected Outcomes  Short Term Goal: Understand basic principles of dietary content, such as calories, fat, sodium, cholesterol and nutrients.;Long Term Goal: Adherence to prescribed nutrition plan.       Nutrition Assessments: Nutrition Assessments - 11/05/17 0944      MEDFICTS Scores   Pre Score  23       Nutrition Goals Re-Evaluation: Nutrition Goals Re-Evaluation    Row Name 11/05/17 0944             Goals   Current Weight  124 lb 9 oz (56.5 kg)       Nutrition Goal  Pt to describe the benefit of including fruits, vegetables, whole grains, and low-fat dairy products in a general healthful meal plan.          Nutrition Goals Re-Evaluation: Nutrition Goals Re-Evaluation    [place] 11/05/17 0944             Goals   Current Weight  124 lb 9 oz (56.5 kg)       Nutrition Goal  Pt to describe the benefit of including fruits, vegetables, whole grains, and low-fat dairy products in a general healthful meal plan.          Nutrition Goals Discharge (Final Nutrition Goals Re-Evaluation): Nutrition Goals Re-Evaluation - 11/05/17 0944      Goals   Current Weight  124 lb 9 oz (56.5 kg)    Nutrition Goal  Pt to describe the benefit of including fruits, vegetables, whole grains, and low-fat dairy products in a general healthful meal  plan.       Psychosocial: Target Goals: Acknowledge presence or absence of significant depression and/or stress, maximize coping skills, provide positive support system. Participant is able to verbalize types and  ability to use techniques and skills needed for reducing stress and depression.  Initial Review & Psychosocial Screening: Initial Psych Review & Screening - 11/04/17 0920      Initial Review   Current issues with  None Identified      Family Dynamics   Good Support System?  Yes   Trent lists her spouse, family, and friends as sources of support for her.      Barriers   Psychosocial barriers to participate in program  There are no identifiable barriers or psychosocial needs.      Screening Interventions   Interventions  Encouraged to exercise       Quality of Life Scores: Quality of Life - 11/04/17 0920      Quality of Life   Select  Quality of Life      Quality of Life Scores   Health/Function Pre  27.21 %    Socioeconomic Pre  30 %    Psych/Spiritual Pre  29.14 %    Family Pre  27.6 %    GLOBAL Pre  28.22 %      Scores of 19 and below usually indicate a poorer quality of life in these areas.  A difference of  2-3 points is a clinically meaningful difference.  A difference of 2-3 points in the total score of the Quality of Life Index has been associated with significant improvement in overall quality of life, self-image, physical symptoms, and general health in studies assessing change in quality of life.  PHQ-9: Recent Review Flowsheet Data    Depression screen [facility] 2/9 11/10/2017 07/07/2016 03/31/2016 02/13/2016   Decreased Interest 0 0 0 0   Down, Depressed, Hopeless 0 0 0 0   PHQ - 2 Score 0 0 0 0     Interpretation of Total Score  Total Score Depression Severity:  1-4 = Minimal depression, 5-9 = Mild depression, 10-14 = Moderate depression, 15-19 = Moderately severe depression, 20-27 = Severe depression   Psychosocial Evaluation and Intervention:   Psychosocial Re-Evaluation: Psychosocial Re-Evaluation    [place] Name 11/23/17 1446 12/23/17 1342           Psychosocial Re-Evaluation   Current issues with  None Identified  None Identified      Comments  -   Lataya 's  mother in law recently passed away.  Emotional support offered      Interventions  Encouraged to attend Cardiac Rehabilitation for the exercise  Encouraged to attend Cardiac Rehabilitation for the exercise      Continue Psychosocial Services   No Follow up required  No Follow up required         Psychosocial Discharge (Final Psychosocial Re-Evaluation): Psychosocial Re-Evaluation - 12/23/17 1342      Psychosocial Re-Evaluation   Current issues with  None Identified    Comments  [place] 's  mother in law recently passed away.  Emotional support offered    Interventions  Encouraged to attend Cardiac Rehabilitation for the exercise    Continue Psychosocial Services   No Follow up required       Vocational Rehabilitation: Provide vocational rehab assistance to qualifying candidates.   Vocational Rehab Evaluation & Intervention: Vocational Rehab - 11/04/17 1131      Initial Vocational Rehab Evaluation & Intervention   Assessment shows  need for Vocational Rehabilitation  No       Education: Education Goals: Education classes will be provided on a weekly basis, covering required topics. Participant will state understanding/return demonstration of topics presented.  Learning Barriers/Preferences: Learning Barriers/Preferences - 11/04/17 0905      Learning Barriers/Preferences   Learning Barriers  None    Learning Preferences  Video;Pictoral;Written Material;Computer/Internet       Education Topics: Count Your Pulse:  -Group instruction provided by verbal instruction, demonstration, patient participation and written materials to support subject.  Instructors address importance of being able to find your pulse and how to count your pulse when at home without a heart monitor.  Patients get hands on experience counting their pulse with staff help and individually.   Heart Attack, Angina, and Risk Factor Modification:  -Group instruction provided by verbal instruction, video,  and written materials to support subject.  Instructors address signs and symptoms of angina and heart attacks.    Also discuss risk factors for heart disease and how to make changes to improve heart health risk factors.   Functional Fitness:  -Group instruction provided by verbal instruction, demonstration, patient participation, and written materials to support subject.  Instructors address safety measures for doing things around the house.  Discuss how to get up and down off the floor, how to pick things up properly, how to safely get out of a chair without assistance, and balance training.   Meditation and Mindfulness:  -Group instruction provided by verbal instruction, patient participation, and written materials to support subject.  Instructor addresses importance of mindfulness and meditation practice to help reduce stress and improve awareness.  Instructor also leads participants through a meditation exercise.    Stretching for Flexibility and Mobility:  -Group instruction provided by verbal instruction, patient participation, and written materials to support subject.  Instructors lead participants through series of stretches that are designed to increase flexibility thus improving mobility.  These stretches are additional exercise for major muscle groups that are typically performed during regular warm up and cool down.   Hands Only CPR:  -Group verbal, video, and participation provides a basic overview of AHA guidelines for community CPR. Role-play of emergencies allow participants the opportunity to practice calling for help and chest compression technique with discussion of AED use.   Hypertension: -Group verbal and written instruction that provides a basic overview of hypertension including the most recent diagnostic guidelines, risk factor reduction with self-care instructions and medication management.    Nutrition I class: Heart Healthy Eating:  -Group instruction provided by  PowerPoint slides, verbal discussion, and written materials to support subject matter. The instructor gives an explanation and review of the Therapeutic Lifestyle Changes diet recommendations, which includes a discussion on lipid goals, dietary fat, sodium, fiber, plant stanol/sterol esters, sugar, and the components of a well-balanced, healthy diet.   Nutrition II class: Lifestyle Skills:  -Group instruction provided by PowerPoint slides, verbal discussion, and written materials to support subject matter. The instructor gives an explanation and review of label reading, grocery shopping for heart health, heart healthy recipe modifications, and ways to make healthier choices when eating out.   Diabetes Question & Answer:  -Group instruction provided by PowerPoint slides, verbal discussion, and written materials to support subject matter. The instructor gives an explanation and review of diabetes co-morbidities, pre- and post-prandial blood glucose goals, pre-exercise blood glucose goals, signs, symptoms, and treatment of hypoglycemia and hyperglycemia, and foot care basics.   Diabetes Blitz:  -Group instruction provided  by PowerPoint slides, verbal discussion, and written materials to support subject matter. The instructor gives an explanation and review of the physiology behind type 1 and type 2 diabetes, diabetes medications and rational behind using different medications, pre- and post-prandial blood glucose recommendations and Hemoglobin A1c goals, diabetes diet, and exercise including blood glucose guidelines for exercising safely.    Portion Distortion:  -Group instruction provided by PowerPoint slides, verbal discussion, written materials, and food models to support subject matter. The instructor gives an explanation of serving size versus portion size, changes in portions sizes over the last 20 years, and what consists of a serving from each food group.   Stress Management:  -Group  instruction provided by verbal instruction, video, and written materials to support subject matter.  Instructors review role of stress in heart disease and how to cope with stress positively.     CARDIAC REHAB PHASE II EXERCISE from 11/10/2017 in [facility]  Date  11/10/17  Educator  RN  Instruction Review Code  2- Demonstrated Understanding      Exercising on Your Own:  -Group instruction provided by verbal instruction, power point, and written materials to support subject.  Instructors discuss benefits of exercise, components of exercise, frequency and intensity of exercise, and end points for exercise.  Also discuss use of nitroglycerin and activating EMS.  Review options of places to exercise outside of rehab.  Review guidelines for sex with heart disease.   Cardiac Drugs I:  -Group instruction provided by verbal instruction and written materials to support subject.  Instructor reviews cardiac drug classes: antiplatelets, anticoagulants, beta blockers, and statins.  Instructor discusses reasons, side effects, and lifestyle considerations for each drug class.   Cardiac Drugs II:  -Group instruction provided by verbal instruction and written materials to support subject.  Instructor reviews cardiac drug classes: angiotensin converting enzyme inhibitors (ACE-I), angiotensin II receptor blockers (ARBs), nitrates, and calcium channel blockers.  Instructor discusses reasons, side effects, and lifestyle considerations for each drug class.   Anatomy and Physiology of the Circulatory System:  Group verbal and written instruction and models provide basic cardiac anatomy and physiology, with the coronary electrical and arterial systems. Review of: AMI, Angina, Valve disease, Heart Failure, Peripheral Artery Disease, Cardiac Arrhythmia, Pacemakers, and the ICD.   Other Education:  -Group or individual verbal, written, or video instructions that support the educational  goals of the cardiac rehab program.   Holiday Eating Survival Tips:  -Group instruction provided by PowerPoint slides, verbal discussion, and written materials to support subject matter. The instructor gives patients tips, tricks, and techniques to help them not only survive but enjoy the holidays despite the onslaught of food that accompanies the holidays.   Knowledge Questionnaire Score: Knowledge Questionnaire Score - 11/04/17 0920      Knowledge Questionnaire Score   Pre Score  22/24       Core Components/Risk Factors/Patient Goals at Admission: Personal Goals and Risk Factors at Admission - 11/04/17 1050      Core Components/Risk Factors/Patient Goals on Admission   Lipids  Yes    Intervention  Provide education and support for participant on nutrition & aerobic/resistive exercise along with prescribed medications to achieve LDL <62m, HDL >443m    Expected Outcomes  Short Term: Participant states understanding of desired cholesterol values and is compliant with medications prescribed. Participant is following exercise prescription and nutrition guidelines.;Long Term: Cholesterol controlled with medications as prescribed, with individualized exercise RX and with personalized nutrition plan.  Value goals: LDL < 8m, HDL > 40 mg.    Stress  Yes    Intervention  Offer individual and/or small group education and counseling on adjustment to heart disease, stress management and health-related lifestyle change. Teach and support self-help strategies.;Refer participants experiencing significant psychosocial distress to appropriate mental health specialists for further evaluation and treatment. When possible, include family members and significant others in education/counseling sessions.    Expected Outcomes  Short Term: Participant demonstrates changes in health-related behavior, relaxation and other stress management skills, ability to obtain effective social support, and compliance with  psychotropic medications if prescribed.;Long Term: Emotional wellbeing is indicated by absence of clinically significant psychosocial distress or social isolation.       Core Components/Risk Factors/Patient Goals Review:  Goals and Risk Factor Review    Row Name 11/10/17 1042 11/23/17 1442 12/23/17 1344         Core Components/Risk Factors/Patient Goals Review   Personal Goals Review  Hypertension;Lipids  Hypertension;Lipids  Hypertension;Lipids     Review  -  Jahaira's vital signs have been stable at cardiac rehab. LYoselynhas maintained his current weight. LLilashas not voiced any concerns about any additional stressors.  Ling's vital signs have been stable at cardiac rehab. LMatheahas maintained his current weight. LSoraiyahas not voiced any concerns about any additional stressors.     Expected Outcomes  -  LBrookelynwill contine to participate in phase 2 cardiac rehab, follow nutrition and lifestyle modifications.  LTnyawill contine to participate in phase 2 cardiac rehab, follow nutrition and lifestyle modifications.        Core Components/Risk Factors/Patient Goals at Discharge (Final Review):  Goals and Risk Factor Review - 12/23/17 1344      Core Components/Risk Factors/Patient Goals Review   Personal Goals Review  Hypertension;Lipids    Review  Cire's vital signs have been stable at cardiac rehab. LAlbenahas maintained his current weight. LKodeehas not voiced any concerns about any additional stressors.    Expected Outcomes  LLaryahwill contine to participate in phase 2 cardiac rehab, follow nutrition and lifestyle modifications.       ITP Comments: ITP Comments    Row Name 11/04/17 0902 11/23/17 1441 12/23/17 1341       ITP Comments  Dr. TFransico Him Medical Director  30 Day ITP Review. Patient with good attendance and participation in phase 2 cardiac rehab  30 Day ITP Review. Patient with good attendance and participation in phase 2 cardiac rehab. LOliviarosewas out last week due to her  [place]        Comments: See ITP comments.MBarnet Pall RN,BSN 12/23/2017 1:51 PM

## 2017-12-24 ENCOUNTER — Ambulatory Visit (HOSPITAL_COMMUNITY): Payer: BLUE CROSS/BLUE SHIELD

## 2017-12-24 ENCOUNTER — Encounter (HOSPITAL_COMMUNITY)
Admission: RE | Admit: 2017-12-24 | Discharge: 2017-12-24 | Disposition: A | Discharge status: Home / Self Care | Payer: BLUE CROSS/BLUE SHIELD | Source: Ambulatory Visit | Attending: Cardiology | Admitting: Cardiology

## 2017-12-24 DIAGNOSIS — K5010 Crohn's disease of large intestine without complications: Secondary | ICD-10-CM | POA: Diagnosis not present

## 2017-12-24 DIAGNOSIS — E039 Hypothyroidism, unspecified: Secondary | ICD-10-CM | POA: Diagnosis not present

## 2017-12-24 DIAGNOSIS — Z79899 Other long term (current) drug therapy: Secondary | ICD-10-CM | POA: Diagnosis not present

## 2017-12-24 DIAGNOSIS — Z79890 Hormone replacement therapy (postmenopausal): Secondary | ICD-10-CM | POA: Diagnosis not present

## 2017-12-24 DIAGNOSIS — M1990 Unspecified osteoarthritis, unspecified site: Secondary | ICD-10-CM | POA: Diagnosis not present

## 2017-12-24 DIAGNOSIS — Z9889 Other specified postprocedural states: Secondary | ICD-10-CM | POA: Diagnosis not present

## 2017-12-24 DIAGNOSIS — Z952 Presence of prosthetic heart valve: Secondary | ICD-10-CM

## 2017-12-24 DIAGNOSIS — Z932 Ileostomy status: Secondary | ICD-10-CM | POA: Diagnosis not present

## 2017-12-24 DIAGNOSIS — Z8679 Personal history of other diseases of the circulatory system: Secondary | ICD-10-CM | POA: Diagnosis not present

## 2017-12-24 DIAGNOSIS — Z85820 Personal history of malignant melanoma of skin: Secondary | ICD-10-CM | POA: Diagnosis not present

## 2017-12-27 ENCOUNTER — Ambulatory Visit (HOSPITAL_COMMUNITY): Payer: BLUE CROSS/BLUE SHIELD

## 2017-12-27 ENCOUNTER — Encounter (HOSPITAL_COMMUNITY)
Admission: RE | Admit: 2017-12-27 | Discharge: 2017-12-27 | Disposition: A | Discharge status: Home / Self Care | Payer: BLUE CROSS/BLUE SHIELD | Source: Ambulatory Visit | Attending: Cardiology | Admitting: Cardiology

## 2017-12-27 DIAGNOSIS — Z8679 Personal history of other diseases of the circulatory system: Secondary | ICD-10-CM | POA: Diagnosis not present

## 2017-12-27 DIAGNOSIS — Z9889 Other specified postprocedural states: Secondary | ICD-10-CM | POA: Diagnosis not present

## 2017-12-27 DIAGNOSIS — K5010 Crohn's disease of large intestine without complications: Secondary | ICD-10-CM | POA: Diagnosis not present

## 2017-12-27 DIAGNOSIS — Z79890 Hormone replacement therapy (postmenopausal): Secondary | ICD-10-CM | POA: Diagnosis not present

## 2017-12-27 DIAGNOSIS — Z79899 Other long term (current) drug therapy: Secondary | ICD-10-CM | POA: Diagnosis not present

## 2017-12-27 DIAGNOSIS — E039 Hypothyroidism, unspecified: Secondary | ICD-10-CM | POA: Diagnosis not present

## 2017-12-27 DIAGNOSIS — Z932 Ileostomy status: Secondary | ICD-10-CM | POA: Diagnosis not present

## 2017-12-27 DIAGNOSIS — Z85820 Personal history of malignant melanoma of skin: Secondary | ICD-10-CM | POA: Diagnosis not present

## 2017-12-27 DIAGNOSIS — Z952 Presence of prosthetic heart valve: Secondary | ICD-10-CM

## 2017-12-27 DIAGNOSIS — M1990 Unspecified osteoarthritis, unspecified site: Secondary | ICD-10-CM | POA: Diagnosis not present

## 2017-12-28 DIAGNOSIS — H6121 Impacted cerumen, right ear: Secondary | ICD-10-CM | POA: Diagnosis not present

## 2017-12-28 DIAGNOSIS — Z0000 Encounter for general adult medical examination without abnormal findings: Secondary | ICD-10-CM | POA: Diagnosis not present

## 2017-12-28 DIAGNOSIS — Z681 Body mass index (BMI) 19 or less, adult: Secondary | ICD-10-CM | POA: Diagnosis not present

## 2017-12-29 ENCOUNTER — Encounter (HOSPITAL_COMMUNITY): Payer: BLUE CROSS/BLUE SHIELD

## 2017-12-29 ENCOUNTER — Ambulatory Visit (HOSPITAL_COMMUNITY): Payer: BLUE CROSS/BLUE SHIELD

## 2017-12-30 ENCOUNTER — Other Ambulatory Visit: Payer: Self-pay

## 2017-12-30 MED ORDER — TRAMADOL HCL 50 MG PO TABS: ORAL_TABLET | ORAL | 2 refills | Status: DC

## 2017-12-31 ENCOUNTER — Ambulatory Visit (HOSPITAL_COMMUNITY): Payer: BLUE CROSS/BLUE SHIELD

## 2017-12-31 ENCOUNTER — Encounter (HOSPITAL_COMMUNITY)
Admission: RE | Admit: 2017-12-31 | Discharge: 2017-12-31 | Disposition: A | Discharge status: Home / Self Care | Payer: BLUE CROSS/BLUE SHIELD | Source: Ambulatory Visit | Attending: Cardiology | Admitting: Cardiology

## 2017-12-31 DIAGNOSIS — Z79890 Hormone replacement therapy (postmenopausal): Secondary | ICD-10-CM | POA: Diagnosis not present

## 2017-12-31 DIAGNOSIS — Z79899 Other long term (current) drug therapy: Secondary | ICD-10-CM | POA: Diagnosis not present

## 2017-12-31 DIAGNOSIS — E039 Hypothyroidism, unspecified: Secondary | ICD-10-CM | POA: Diagnosis not present

## 2017-12-31 DIAGNOSIS — Z8679 Personal history of other diseases of the circulatory system: Secondary | ICD-10-CM | POA: Diagnosis not present

## 2017-12-31 DIAGNOSIS — Z23 Encounter for immunization: Secondary | ICD-10-CM | POA: Diagnosis not present

## 2017-12-31 DIAGNOSIS — Z85820 Personal history of malignant melanoma of skin: Secondary | ICD-10-CM | POA: Diagnosis not present

## 2017-12-31 DIAGNOSIS — Z932 Ileostomy status: Secondary | ICD-10-CM | POA: Diagnosis not present

## 2017-12-31 DIAGNOSIS — K5010 Crohn's disease of large intestine without complications: Secondary | ICD-10-CM | POA: Diagnosis not present

## 2017-12-31 DIAGNOSIS — F4322 Adjustment disorder with anxiety: Secondary | ICD-10-CM | POA: Diagnosis not present

## 2017-12-31 DIAGNOSIS — Z9889 Other specified postprocedural states: Secondary | ICD-10-CM | POA: Diagnosis not present

## 2017-12-31 DIAGNOSIS — Z952 Presence of prosthetic heart valve: Secondary | ICD-10-CM

## 2017-12-31 DIAGNOSIS — M1990 Unspecified osteoarthritis, unspecified site: Secondary | ICD-10-CM | POA: Diagnosis not present

## 2018-01-03 ENCOUNTER — Ambulatory Visit (HOSPITAL_COMMUNITY): Payer: BLUE CROSS/BLUE SHIELD

## 2018-01-03 ENCOUNTER — Encounter (HOSPITAL_COMMUNITY)
Admission: RE | Admit: 2018-01-03 | Discharge: 2018-01-03 | Disposition: A | Discharge status: Home / Self Care | Payer: BLUE CROSS/BLUE SHIELD | Source: Ambulatory Visit | Attending: Cardiology | Admitting: Cardiology

## 2018-01-03 DIAGNOSIS — Z952 Presence of prosthetic heart valve: Secondary | ICD-10-CM

## 2018-01-03 DIAGNOSIS — K5010 Crohn's disease of large intestine without complications: Secondary | ICD-10-CM | POA: Diagnosis not present

## 2018-01-03 DIAGNOSIS — Z79899 Other long term (current) drug therapy: Secondary | ICD-10-CM | POA: Diagnosis not present

## 2018-01-03 DIAGNOSIS — E039 Hypothyroidism, unspecified: Secondary | ICD-10-CM | POA: Diagnosis not present

## 2018-01-03 DIAGNOSIS — Z9889 Other specified postprocedural states: Secondary | ICD-10-CM | POA: Diagnosis not present

## 2018-01-03 DIAGNOSIS — Z932 Ileostomy status: Secondary | ICD-10-CM | POA: Diagnosis not present

## 2018-01-03 DIAGNOSIS — Z8679 Personal history of other diseases of the circulatory system: Secondary | ICD-10-CM | POA: Diagnosis not present

## 2018-01-03 DIAGNOSIS — Z79890 Hormone replacement therapy (postmenopausal): Secondary | ICD-10-CM | POA: Diagnosis not present

## 2018-01-03 DIAGNOSIS — M1990 Unspecified osteoarthritis, unspecified site: Secondary | ICD-10-CM | POA: Diagnosis not present

## 2018-01-03 DIAGNOSIS — Z85820 Personal history of malignant melanoma of skin: Secondary | ICD-10-CM | POA: Diagnosis not present

## 2018-01-04 DIAGNOSIS — M2550 Pain in unspecified joint: Secondary | ICD-10-CM | POA: Diagnosis not present

## 2018-01-04 DIAGNOSIS — M25531 Pain in right wrist: Secondary | ICD-10-CM | POA: Diagnosis not present

## 2018-01-04 DIAGNOSIS — M542 Cervicalgia: Secondary | ICD-10-CM | POA: Diagnosis not present

## 2018-01-04 DIAGNOSIS — M79601 Pain in right arm: Secondary | ICD-10-CM | POA: Diagnosis not present

## 2018-01-05 ENCOUNTER — Ambulatory Visit (HOSPITAL_COMMUNITY): Payer: BLUE CROSS/BLUE SHIELD

## 2018-01-05 ENCOUNTER — Encounter (HOSPITAL_COMMUNITY)
Admission: RE | Admit: 2018-01-05 | Discharge: 2018-01-05 | Disposition: A | Discharge status: Home / Self Care | Payer: BLUE CROSS/BLUE SHIELD | Source: Ambulatory Visit | Attending: Cardiology | Admitting: Cardiology

## 2018-01-05 DIAGNOSIS — Z9889 Other specified postprocedural states: Secondary | ICD-10-CM | POA: Diagnosis not present

## 2018-01-05 DIAGNOSIS — Z79899 Other long term (current) drug therapy: Secondary | ICD-10-CM | POA: Diagnosis not present

## 2018-01-05 DIAGNOSIS — Z932 Ileostomy status: Secondary | ICD-10-CM | POA: Diagnosis not present

## 2018-01-05 DIAGNOSIS — K5010 Crohn's disease of large intestine without complications: Secondary | ICD-10-CM | POA: Diagnosis not present

## 2018-01-05 DIAGNOSIS — Z952 Presence of prosthetic heart valve: Secondary | ICD-10-CM

## 2018-01-05 DIAGNOSIS — M1990 Unspecified osteoarthritis, unspecified site: Secondary | ICD-10-CM | POA: Diagnosis not present

## 2018-01-05 DIAGNOSIS — E039 Hypothyroidism, unspecified: Secondary | ICD-10-CM | POA: Diagnosis not present

## 2018-01-05 DIAGNOSIS — Z79890 Hormone replacement therapy (postmenopausal): Secondary | ICD-10-CM | POA: Diagnosis not present

## 2018-01-05 DIAGNOSIS — Z8679 Personal history of other diseases of the circulatory system: Secondary | ICD-10-CM | POA: Diagnosis not present

## 2018-01-05 DIAGNOSIS — Z85820 Personal history of malignant melanoma of skin: Secondary | ICD-10-CM | POA: Diagnosis not present

## 2018-01-05 DIAGNOSIS — F4322 Adjustment disorder with anxiety: Secondary | ICD-10-CM | POA: Diagnosis not present

## 2018-01-07 ENCOUNTER — Ambulatory Visit (HOSPITAL_COMMUNITY): Payer: BLUE CROSS/BLUE SHIELD

## 2018-01-07 ENCOUNTER — Encounter (HOSPITAL_COMMUNITY)
Admission: RE | Admit: 2018-01-07 | Discharge: 2018-01-07 | Disposition: A | Discharge status: Home / Self Care | Payer: BLUE CROSS/BLUE SHIELD | Source: Ambulatory Visit | Attending: Cardiology | Admitting: Cardiology

## 2018-01-07 DIAGNOSIS — E039 Hypothyroidism, unspecified: Secondary | ICD-10-CM | POA: Diagnosis not present

## 2018-01-07 DIAGNOSIS — Z79899 Other long term (current) drug therapy: Secondary | ICD-10-CM | POA: Diagnosis not present

## 2018-01-07 DIAGNOSIS — Z9889 Other specified postprocedural states: Secondary | ICD-10-CM | POA: Diagnosis not present

## 2018-01-07 DIAGNOSIS — Z952 Presence of prosthetic heart valve: Secondary | ICD-10-CM

## 2018-01-07 DIAGNOSIS — Z8679 Personal history of other diseases of the circulatory system: Secondary | ICD-10-CM | POA: Diagnosis not present

## 2018-01-07 DIAGNOSIS — Z85820 Personal history of malignant melanoma of skin: Secondary | ICD-10-CM | POA: Diagnosis not present

## 2018-01-07 DIAGNOSIS — K5010 Crohn's disease of large intestine without complications: Secondary | ICD-10-CM | POA: Diagnosis not present

## 2018-01-07 DIAGNOSIS — Z932 Ileostomy status: Secondary | ICD-10-CM | POA: Diagnosis not present

## 2018-01-07 DIAGNOSIS — M1990 Unspecified osteoarthritis, unspecified site: Secondary | ICD-10-CM | POA: Diagnosis not present

## 2018-01-07 DIAGNOSIS — Z79890 Hormone replacement therapy (postmenopausal): Secondary | ICD-10-CM | POA: Diagnosis not present

## 2018-01-10 ENCOUNTER — Ambulatory Visit (HOSPITAL_COMMUNITY): Payer: BLUE CROSS/BLUE SHIELD

## 2018-01-10 ENCOUNTER — Encounter (HOSPITAL_COMMUNITY): Payer: BLUE CROSS/BLUE SHIELD

## 2018-01-10 DIAGNOSIS — Z932 Ileostomy status: Secondary | ICD-10-CM | POA: Diagnosis not present

## 2018-01-10 DIAGNOSIS — K5090 Crohn's disease, unspecified, without complications: Secondary | ICD-10-CM | POA: Diagnosis not present

## 2018-01-12 ENCOUNTER — Encounter (HOSPITAL_COMMUNITY)
Admission: RE | Admit: 2018-01-12 | Discharge: 2018-01-12 | Disposition: A | Discharge status: Home / Self Care | Payer: BLUE CROSS/BLUE SHIELD | Source: Ambulatory Visit | Attending: Cardiology | Admitting: Cardiology

## 2018-01-12 ENCOUNTER — Ambulatory Visit (HOSPITAL_COMMUNITY): Payer: BLUE CROSS/BLUE SHIELD

## 2018-01-12 DIAGNOSIS — Z79899 Other long term (current) drug therapy: Secondary | ICD-10-CM | POA: Diagnosis not present

## 2018-01-12 DIAGNOSIS — Z9889 Other specified postprocedural states: Secondary | ICD-10-CM | POA: Diagnosis not present

## 2018-01-12 DIAGNOSIS — Z8679 Personal history of other diseases of the circulatory system: Secondary | ICD-10-CM | POA: Diagnosis not present

## 2018-01-12 DIAGNOSIS — M1990 Unspecified osteoarthritis, unspecified site: Secondary | ICD-10-CM | POA: Diagnosis not present

## 2018-01-12 DIAGNOSIS — Z932 Ileostomy status: Secondary | ICD-10-CM | POA: Diagnosis not present

## 2018-01-12 DIAGNOSIS — Z79890 Hormone replacement therapy (postmenopausal): Secondary | ICD-10-CM | POA: Diagnosis not present

## 2018-01-12 DIAGNOSIS — Z952 Presence of prosthetic heart valve: Secondary | ICD-10-CM

## 2018-01-12 DIAGNOSIS — K5010 Crohn's disease of large intestine without complications: Secondary | ICD-10-CM | POA: Diagnosis not present

## 2018-01-12 DIAGNOSIS — Z85820 Personal history of malignant melanoma of skin: Secondary | ICD-10-CM | POA: Diagnosis not present

## 2018-01-12 DIAGNOSIS — E039 Hypothyroidism, unspecified: Secondary | ICD-10-CM | POA: Diagnosis not present

## 2018-01-14 ENCOUNTER — Ambulatory Visit (HOSPITAL_COMMUNITY): Payer: BLUE CROSS/BLUE SHIELD

## 2018-01-14 ENCOUNTER — Encounter (HOSPITAL_COMMUNITY): Admission: RE | Admit: 2018-01-14 | Payer: BLUE CROSS/BLUE SHIELD | Source: Ambulatory Visit

## 2018-01-17 ENCOUNTER — Encounter (HOSPITAL_COMMUNITY)
Admission: RE | Admit: 2018-01-17 | Discharge: 2018-01-17 | Disposition: A | Discharge status: Home / Self Care | Payer: BLUE CROSS/BLUE SHIELD | Source: Ambulatory Visit | Attending: Cardiology | Admitting: Cardiology

## 2018-01-17 ENCOUNTER — Ambulatory Visit (HOSPITAL_COMMUNITY): Payer: BLUE CROSS/BLUE SHIELD

## 2018-01-17 DIAGNOSIS — Z9889 Other specified postprocedural states: Secondary | ICD-10-CM | POA: Diagnosis not present

## 2018-01-17 DIAGNOSIS — Z85828 Personal history of other malignant neoplasm of skin: Secondary | ICD-10-CM | POA: Diagnosis not present

## 2018-01-17 DIAGNOSIS — Z932 Ileostomy status: Secondary | ICD-10-CM | POA: Diagnosis not present

## 2018-01-17 DIAGNOSIS — M1990 Unspecified osteoarthritis, unspecified site: Secondary | ICD-10-CM | POA: Diagnosis not present

## 2018-01-17 DIAGNOSIS — E039 Hypothyroidism, unspecified: Secondary | ICD-10-CM | POA: Diagnosis not present

## 2018-01-17 DIAGNOSIS — K5010 Crohn's disease of large intestine without complications: Secondary | ICD-10-CM | POA: Diagnosis not present

## 2018-01-17 DIAGNOSIS — Z85820 Personal history of malignant melanoma of skin: Secondary | ICD-10-CM | POA: Diagnosis not present

## 2018-01-17 DIAGNOSIS — Z79890 Hormone replacement therapy (postmenopausal): Secondary | ICD-10-CM | POA: Diagnosis not present

## 2018-01-17 DIAGNOSIS — Z952 Presence of prosthetic heart valve: Secondary | ICD-10-CM

## 2018-01-17 DIAGNOSIS — Z8679 Personal history of other diseases of the circulatory system: Secondary | ICD-10-CM | POA: Diagnosis not present

## 2018-01-17 DIAGNOSIS — D2371 Other benign neoplasm of skin of right lower limb, including hip: Secondary | ICD-10-CM | POA: Diagnosis not present

## 2018-01-17 DIAGNOSIS — Z79899 Other long term (current) drug therapy: Secondary | ICD-10-CM | POA: Diagnosis not present

## 2018-01-18 DIAGNOSIS — F4322 Adjustment disorder with anxiety: Secondary | ICD-10-CM | POA: Diagnosis not present

## 2018-01-19 ENCOUNTER — Ambulatory Visit (HOSPITAL_COMMUNITY): Payer: BLUE CROSS/BLUE SHIELD

## 2018-01-19 ENCOUNTER — Encounter (HOSPITAL_COMMUNITY)
Admission: RE | Admit: 2018-01-19 | Discharge: 2018-01-19 | Disposition: A | Discharge status: Home / Self Care | Payer: BLUE CROSS/BLUE SHIELD | Source: Ambulatory Visit | Attending: Cardiology | Admitting: Cardiology

## 2018-01-19 DIAGNOSIS — Z932 Ileostomy status: Secondary | ICD-10-CM | POA: Diagnosis not present

## 2018-01-19 DIAGNOSIS — Z79899 Other long term (current) drug therapy: Secondary | ICD-10-CM | POA: Insufficient documentation

## 2018-01-19 DIAGNOSIS — Z9889 Other specified postprocedural states: Secondary | ICD-10-CM | POA: Insufficient documentation

## 2018-01-19 DIAGNOSIS — Z8679 Personal history of other diseases of the circulatory system: Secondary | ICD-10-CM | POA: Insufficient documentation

## 2018-01-19 DIAGNOSIS — E039 Hypothyroidism, unspecified: Secondary | ICD-10-CM | POA: Diagnosis not present

## 2018-01-19 DIAGNOSIS — K5010 Crohn's disease of large intestine without complications: Secondary | ICD-10-CM | POA: Insufficient documentation

## 2018-01-19 DIAGNOSIS — Z85820 Personal history of malignant melanoma of skin: Secondary | ICD-10-CM | POA: Insufficient documentation

## 2018-01-19 DIAGNOSIS — Z952 Presence of prosthetic heart valve: Secondary | ICD-10-CM

## 2018-01-19 DIAGNOSIS — M1990 Unspecified osteoarthritis, unspecified site: Secondary | ICD-10-CM | POA: Diagnosis not present

## 2018-01-19 DIAGNOSIS — Z79890 Hormone replacement therapy (postmenopausal): Secondary | ICD-10-CM | POA: Diagnosis not present

## 2018-01-20 DIAGNOSIS — M65831 Other synovitis and tenosynovitis, right forearm: Secondary | ICD-10-CM | POA: Diagnosis not present

## 2018-01-20 DIAGNOSIS — M25531 Pain in right wrist: Secondary | ICD-10-CM | POA: Diagnosis not present

## 2018-01-20 NOTE — Progress Notes (Signed)
Cardiac Individual Treatment Plan  Patient Details  Name: Brittany Whitney MRN: [id number] Date of Birth: July 05, 1955 Referring Provider:     CARDIAC REHAB PHASE II ORIENTATION from 11/04/2017 in [place]  Referring Provider  Adrian Prows, MD      Initial Encounter Date:    CARDIAC REHAB PHASE II ORIENTATION from 11/04/2017 in [place]  Date  11/04/17      Visit Diagnosis: 08/10/17 S/P AVR (repair) at [facility] Medications on Admission:  Current Outpatient Medications:  .  Biotin 5000 MCG TABS, Take 1 tablet by mouth daily., Disp: , Rfl:  .  Bismuth Subgallate (DEVROM) 200 MG CHEW, Chew 1 tablet by mouth 2 (two) times daily., Disp: , Rfl:  .  CALCIUM & MAGNESIUM CARBONATES PO, Take 3 tablets by mouth 2 (two) times daily. 500/500, Disp: , Rfl:  .  Cholecalciferol 5000 units capsule, Take 5,000 Units by mouth 2 (two) times daily., Disp: , Rfl:  .  DHEA 10 MG CAPS, Take 1 capsule by mouth every morning. , Disp: , Rfl:  .  Diindolylmethane POWD, Take 1 capsule by mouth 2 (two) times daily. 100 mg capsule, Disp: , Rfl:  .  doxycycline (PERIOSTAT) 20 MG tablet, Take 20 mg by mouth 2 (two) times daily., Disp: , Rfl:  .  estradiol (VIVELLE-DOT) 0.025 MG/24HR, APPLY 1 PATCH TWICE WEEKLY AS DIRECTED., Disp: , Rfl: 1 .  ibuprofen (ADVIL,MOTRIN) 200 MG tablet, Take 200 mg by mouth every 6 (six) hours as needed., Disp: , Rfl:  .  levothyroxine (SYNTHROID, LEVOTHROID) 100 MCG tablet, Take 100 mcg by mouth daily before breakfast., Disp: , Rfl:  .  meloxicam (MOBIC) 15 MG tablet, Take 1 tablet (15 mg total) by mouth daily., Disp: 90 tablet, Rfl: 2 .  Multiple Vitamin (MULTIVITAMIN WITH MINERALS) TABS, Take 1 tablet by mouth daily. , Disp: , Rfl:  .  naratriptan (AMERGE) 2.5 MG tablet, Take 2.5 mg by mouth as needed. Take one (1) tablet at onset of headache; if returns or does not resolve, may repeat after 4 hours; do  not exceed five (5) mg in 24 hours., Disp: , Rfl:  .  Omega-3 1000 MG CAPS, Take 1 capsule by mouth 2 (two) times daily., Disp: , Rfl:  .  predniSONE (DELTASONE) 5 MG tablet, Take 5 mg by mouth every other day. TAKES IN AM, Disp: , Rfl:  .  PRESCRIPTION MEDICATION, Apply 1 application topically daily. Compound cream: testosterone .015% in a 10 ml syringe. Apply 1 ml appilcation to thin skinned area(s), Disp: , Rfl:  .  progesterone (PROMETRIUM) 100 MG capsule, Take 125 mg by mouth at bedtime. COMPOUND MEDICATION IN PILL FORM, Disp: , Rfl:  .  sulfaSALAzine (AZULFIDINE) 500 MG tablet, Take 500 mg by mouth 2 (two) times daily., Disp: , Rfl:  .  SUMAtriptan (IMITREX) 50 MG tablet, Take 50 mg by mouth every 2 (two) hours as needed., Disp: , Rfl:  .  traMADol (ULTRAM) 50 MG tablet, TAKE 1 TABLET BY MOUTH TWICE A DAY, Disp: 60 tablet, Rfl: 2 .  Vitamin D, Ergocalciferol, (DRISDOL) 50000 UNITS CAPS, Take 50,000 Units by mouth every 14 (fourteen) days. , Disp: , Rfl:  .  zolpidem (AMBIEN) 10 MG tablet, Take 5 mg by mouth at bedtime. for sleep, Disp: , Rfl: 3  Past Medical History: Past Medical History:  Diagnosis Date  . Arthritis   . Crohn's colitis ([place])   .  Endometrial polyp   . History of exercise stress test 12-25-2016  by dr Einar Gip   no evidence ischemia, exercise tolerence excellent, no significant arrhythmias  . History of malignant melanoma of skin    2005  post excision back area-- localized  . Hypothyroidism   . Ileostomy in place [facility])    for crohn's colitis -- external pouch  . Migraines   . Severe aortic valve regurgitation cardiologist-  dr Einar Gip--  dx by echo 2017   echo 06/ 10/2016 no change except noted mod. to sev. pulmonry htn/  verified by cardiac cath 07/ 2018 showed no evidence pulmonary htn  . Vitamin D deficiency     Tobacco Use: Social History   Tobacco Use  Smoking Status Never Smoker  Smokeless Tobacco Never Used    Labs: Recent Review Flowsheet Data    Labs  for ITP Cardiac and Pulmonary Rehab Latest Ref Rng & Units 10/27/2016 10/27/2016   PHART 7.350 - 7.450 - 7.579(H)   PCO2ART 32.0 - 48.0 mmHg - 22.0(L)   HCO3 20.0 - 28.0 mmol/L 22.7 20.6   TCO2 0 - 100 mmol/L 24 21   O2SAT % 70.0 99.0      Capillary Blood Glucose: No results found for: GLUCAP   Exercise Target Goals: Exercise Program Goal: Individual exercise prescription set using results from initial 6 min walk test and THRR while considering  patient's activity barriers and safety.   Exercise Prescription Goal: Initial exercise prescription builds to 30-45 minutes a day of aerobic activity, 2-3 days per week.  Home exercise guidelines will be given to patient during program as part of exercise prescription that the participant will acknowledge.  Activity Barriers & Risk Stratification: Activity Barriers & Cardiac Risk Stratification - 11/04/17 0905      Activity Barriers & Cardiac Risk Stratification   Activity Barriers  None    Cardiac Risk Stratification  High       6 Minute Walk: 6 Minute Walk    Row Name 11/04/17 1046         6 Minute Walk   Phase  Initial     Distance  1964 feet     Walk Time  6 minutes     # of Rest Breaks  0     MPH  3.7     METS  4.9     RPE  5.1     VO2 Peak  17.3     Symptoms  No     Resting HR  81 bpm     Resting BP  98/64     Resting Oxygen Saturation   99 %     Exercise Oxygen Saturation  during 6 min walk  98 %     Max Ex. HR  123 bpm     Max Ex. BP  110/66     2 Minute Post BP  104/70        Oxygen Initial Assessment:   Oxygen Re-Evaluation:   Oxygen Discharge (Final Oxygen Re-Evaluation):   Initial Exercise Prescription: Initial Exercise Prescription - 11/04/17 1100      Date of Initial Exercise RX and Referring Provider   Date  11/04/17    Referring Provider  Adrian Prows, MD      Treadmill   MPH  3.3    Grade  1    Minutes  10    METs  3.98      Bike   Level  1    Minutes  10    METs  4.43      NuStep    Level  4    SPM  90    Minutes  10    METs  3      Prescription Details   Frequency (times per week)  3    Duration  Progress to 30 minutes of continuous aerobic without signs/symptoms of physical distress      Intensity   THRR 40-80% of Max Heartrate  63-126    Ratings of Perceived Exertion  11-13    Perceived Dyspnea  0-4      Progression   Progression  Continue to progress workloads to maintain intensity without signs/symptoms of physical distress.      Resistance Training   Training Prescription  Yes    Weight  3lbs    Reps  10-15       Perform Capillary Blood Glucose checks as needed.  Exercise Prescription Changes: Exercise Prescription Changes    Row Name 11/15/17 0950 11/29/17 0955 12/08/17 0955 12/22/17 0955 12/27/17 0949     Response to Exercise   Blood Pressure (Admit)  100/62  114/72  102/80  124/80  108/72   Blood Pressure (Exercise)  122/68  108/72  112/72  120/70  118/78   Blood Pressure (Exit)  118/62  110/78  106/78  102/68  102/78   Heart Rate (Admit)  94 bpm  92 bpm  98 bpm  99 bpm  94 bpm   Heart Rate (Exercise)  141 bpm  140 bpm  131 bpm  129 bpm  133 bpm   Heart Rate (Exit)  100 bpm  90 bpm  74 bpm  99 bpm  93 bpm   Rating of Perceived Exertion (Exercise)  12  12  12  12  11    Symptoms  none  none  none  none  none   Duration  Progress to 30 minutes of  aerobic without signs/symptoms of physical distress  Progress to 30 minutes of  aerobic without signs/symptoms of physical distress  Progress to 30 minutes of  aerobic without signs/symptoms of physical distress  Progress to 30 minutes of  aerobic without signs/symptoms of physical distress  Progress to 30 minutes of  aerobic without signs/symptoms of physical distress   Intensity  THRR unchanged  THRR unchanged  THRR unchanged  THRR unchanged  THRR unchanged     Progression   Progression  Continue to progress workloads to maintain intensity without signs/symptoms of physical distress.  Continue to  progress workloads to maintain intensity without signs/symptoms of physical distress.  Continue to progress workloads to maintain intensity without signs/symptoms of physical distress.  Continue to progress workloads to maintain intensity without signs/symptoms of physical distress.  Continue to progress workloads to maintain intensity without signs/symptoms of physical distress.   Average METs  3.8  3.9  4.1  4.4  4.9     Resistance Training   Training Prescription  No  Yes  No Relaxation day, no weights.  No Relaxation day, no weights.  Yes   Weight  -  4lbs  -  -  3lbs   Reps  -  10-15  -  -  10-15   Time  -  10 Minutes  -  -  10 Minutes     Interval Training   Interval Training  No  No  No  No  No     Treadmill   MPH  3  3  3.2  3.2  3.5   Grade  1  1  2  2   2.5   Minutes  10  10  10  10  10    METs  3.71  3.71  4.33  4.33  4.89     Bike   Level  1  1  1  1   -   Minutes  10  10  10  10   -   METs  4.35  4.33  4.38  4.38  -     NuStep   Level  4  4  4  4  4    SPM  90  90  90  90  90   Minutes  10  10  10  10  10    METs  3.2  3.6  3.5  -  4.4     Rower   Level  -  -  -  -  4   Watts  -  -  -  -  26   Minutes  -  -  -  -  10   METs  -  -  -  -  5.3     Home Exercise Plan   Plans to continue exercise at  Home (comment) Patient plans to resume exercise at gym eventually.  Home (comment) Patient plans to resume exercise at gym eventually.  Home (comment) Patient plans to resume exercise at gym eventually.  Home (comment) Patient plans to resume exercise at gym eventually.  Home (comment) Patient plans to resume exercise at gym eventually.   Frequency  Add 2 additional days to program exercise sessions.  Add 2 additional days to program exercise sessions.  Add 2 additional days to program exercise sessions.  Add 2 additional days to program exercise sessions.  Add 2 additional days to program exercise sessions.   Initial Home Exercises Provided  11/15/17  11/15/17  11/15/17  11/15/17   11/15/17   Row Name 01/12/18 0947             Response to Exercise   Blood Pressure (Admit)  115/72       Blood Pressure (Exercise)  112/78       Blood Pressure (Exit)  124/70       Heart Rate (Admit)  88 bpm       Heart Rate (Exercise)  137 bpm       Heart Rate (Exit)  86 bpm       Rating of Perceived Exertion (Exercise)  12       Symptoms  none       Duration  Progress to 30 minutes of  aerobic without signs/symptoms of physical distress       Intensity  THRR unchanged         Progression   Progression  Continue to progress workloads to maintain intensity without signs/symptoms of physical distress.       Average METs  5.1         Resistance Training   Training Prescription  No Relaxation day. no weights       Weight  -       Reps  -       Time  -         Interval Training   Interval Training  No         Treadmill   MPH  3.5       Grade  2.5  Minutes  10       METs  4.89         NuStep   Level  7       SPM  90       Minutes  10       METs  4.6         Rower   Level  6       Watts  36       Minutes  10       METs  5.7         Home Exercise Plan   Plans to continue exercise at  Home (comment) Patient plans to resume exercise at gym eventually.       Frequency  Add 2 additional days to program exercise sessions.       Initial Home Exercises Provided  11/15/17          Exercise Comments: Exercise Comments    Row Name 11/15/17 1030 12/22/17 1030 12/27/17 1019 01/12/18 1015     Exercise Comments  Reviewed home exercise guidelines, METs, and goals with patient.  Patient returned today after being out for personal reasons. Tolerated exercise well without c/o.  Oriented patient to rower, tolerated well.  Reviewed METs and goals with patient.       Exercise Goals and Review: Exercise Goals    Row Name 11/04/17 0905             Exercise Goals   Increase Physical Activity  Yes       Intervention  Provide advice, education, support and counseling  about physical activity/exercise needs.;Develop an individualized exercise prescription for aerobic and resistive training based on initial evaluation findings, risk stratification, comorbidities and participant's personal goals.       Expected Outcomes  Short Term: Attend rehab on a regular basis to increase amount of physical activity.;Long Term: Add in home exercise to make exercise part of routine and to increase amount of physical activity.;Long Term: Exercising regularly at least 3-5 days a week.       Increase Strength and Stamina  Yes       Intervention  Provide advice, education, support and counseling about physical activity/exercise needs.;Develop an individualized exercise prescription for aerobic and resistive training based on initial evaluation findings, risk stratification, comorbidities and participant's personal goals.       Expected Outcomes  Short Term: Increase workloads from initial exercise prescription for resistance, speed, and METs.;Short Term: Perform resistance training exercises routinely during rehab and add in resistance training at home;Long Term: Improve cardiorespiratory fitness, muscular endurance and strength as measured by increased METs and functional capacity (6MWT)       Able to understand and use rate of perceived exertion (RPE) scale  Yes       Intervention  Provide education and explanation on how to use RPE scale       Expected Outcomes  Short Term: Able to use RPE daily in rehab to express subjective intensity level;Long Term:  Able to use RPE to guide intensity level when exercising independently       Knowledge and understanding of Target Heart Rate Range (THRR)  Yes       Intervention  Provide education and explanation of THRR including how the numbers were predicted and where they are located for reference       Expected Outcomes  Short Term: Able to state/look up THRR;Long Term: Able to use THRR to govern intensity when exercising  independently;Short Term:  Able to use daily as guideline for intensity in rehab       Able to check pulse independently  Yes       Intervention  Provide education and demonstration on how to check pulse in carotid and radial arteries.;Review the importance of being able to check your own pulse for safety during independent exercise       Expected Outcomes  Short Term: Able to explain why pulse checking is important during independent exercise;Long Term: Able to check pulse independently and accurately       Understanding of Exercise Prescription  Yes       Intervention  Provide education, explanation, and written materials on patient's individual exercise prescription       Expected Outcomes  Long Term: Able to explain home exercise prescription to exercise independently;Short Term: Able to explain program exercise prescription          Exercise Goals Re-Evaluation : Exercise Goals Re-Evaluation    Row Name 11/15/17 1030 12/22/17 1030 01/12/18 1015         Exercise Goal Re-Evaluation   Exercise Goals Review  Increase Physical Activity;Understanding of Exercise Prescription;Able to check pulse independently;Able to understand and use rate of perceived exertion (RPE) scale;Knowledge and understanding of Target Heart Rate Range (THRR)  Increase Physical Activity  Increase Physical Activity     Comments  Reviewed home exercise guidelines with patient including THRR, RPE scale, and endpoints for exercise. Pt is able to monitor heart rate. Pt plans to walk as her mode of home exercise.  Patient returned today after being out the past week. Pt states she's been doing a lot of walking.  Patient is doing some walking as her mode of home exericse. Encouraged patient to stay consistent with walking routine to maintain fitness gains. Pt is achieving 5.1 METs with exercise at cardiac rehab.     Expected Outcomes  Patient will walk 30 minutes 1-2 days/week in addition to exercise at cardiac rehab to help increase strength and return to  previous exercise activities.  Increase workloads as tolerated.  Patient will maintain a consistent walking routine in addition to exercise at caridac rehab to maintain fitness improvements.        Discharge Exercise Prescription (Final Exercise Prescription Changes): Exercise Prescription Changes - 01/12/18 0947      Response to Exercise   Blood Pressure (Admit)  115/72    Blood Pressure (Exercise)  112/78    Blood Pressure (Exit)  124/70    Heart Rate (Admit)  88 bpm    Heart Rate (Exercise)  137 bpm    Heart Rate (Exit)  86 bpm    Rating of Perceived Exertion (Exercise)  12    Symptoms  none    Duration  Progress to 30 minutes of  aerobic without signs/symptoms of physical distress    Intensity  THRR unchanged      Progression   Progression  Continue to progress workloads to maintain intensity without signs/symptoms of physical distress.    Average METs  5.1      Resistance Training   Training Prescription  No   Relaxation day. no weights   Weight  --    Reps  --    Time  --      Interval Training   Interval Training  No      Treadmill   MPH  3.5    Grade  2.5    Minutes  10    METs  4.89      NuStep   Level  7    SPM  90    Minutes  10    METs  4.6      Rower   Level  6    Watts  36    Minutes  10    METs  5.7      Home Exercise Plan   Plans to continue exercise at  Home (comment)   Patient plans to resume exercise at gym eventually.   Frequency  Add 2 additional days to program exercise sessions.    Initial Home Exercises Provided  11/15/17       Nutrition:  Target Goals: Understanding of nutrition guidelines, daily intake of sodium <1518m, cholesterol <2047m calories 30% from fat and 7% or less from saturated fats, daily to have 5 or more servings of fruits and vegetables.  Biometrics: Pre Biometrics - 11/04/17 1049      Pre Biometrics   Height  5' 7"  (1.702 m)    Weight  56.5 kg    Waist Circumference  28 inches    Hip Circumference  35  inches    Waist to Hip Ratio  0.8 %    BMI (Calculated)  19.5    Triceps Skinfold  22 mm    % Body Fat  30.4 %    Grip Strength  32 kg    Flexibility  18 in    Single Leg Stand  30 seconds        Nutrition Therapy Plan and Nutrition Goals: Nutrition Therapy & Goals - 11/05/17 0943      Nutrition Therapy   Diet  general, healthful      Personal Nutrition Goals   Nutrition Goal  Pt to describe the benefit of including fruits, vegetables, whole grains, and low-fat dairy products in a general healthful meal plan.      Intervention Plan   Intervention  Prescribe, educate and counsel regarding individualized specific dietary modifications aiming towards targeted core components such as weight, hypertension, lipid management, diabetes, heart failure and other comorbidities.    Expected Outcomes  Short Term Goal: Understand basic principles of dietary content, such as calories, fat, sodium, cholesterol and nutrients.;Long Term Goal: Adherence to prescribed nutrition plan.       Nutrition Assessments: Nutrition Assessments - 11/05/17 0944      MEDFICTS Scores   Pre Score  23       Nutrition Goals Re-Evaluation: Nutrition Goals Re-Evaluation    Row Name 11/05/17 0944             Goals   Current Weight  124 lb 9 oz (56.5 kg)       Nutrition Goal  Pt to describe the benefit of including fruits, vegetables, whole grains, and low-fat dairy products in a general healthful meal plan.          Nutrition Goals Re-Evaluation: Nutrition Goals Re-Evaluation    [place] 11/05/17 0944             Goals   Current Weight  124 lb 9 oz (56.5 kg)       Nutrition Goal  Pt to describe the benefit of including fruits, vegetables, whole grains, and low-fat dairy products in a general healthful meal plan.          Nutrition Goals Discharge (Final Nutrition Goals Re-Evaluation): Nutrition Goals Re-Evaluation - 11/05/17 0944      Goals   Current Weight  124 lb 9  oz (56.5 kg)     Nutrition Goal  Pt to describe the benefit of including fruits, vegetables, whole grains, and low-fat dairy products in a general healthful meal plan.       Psychosocial: Target Goals: Acknowledge presence or absence of significant depression and/or stress, maximize coping skills, provide positive support system. Participant is able to verbalize types and ability to use techniques and skills needed for reducing stress and depression.  Initial Review & Psychosocial Screening: Initial Psych Review & Screening - 11/04/17 0920      Initial Review   Current issues with  None Identified      Family Dynamics   Good Support System?  Yes   Alainah lists her spouse, family, and friends as sources of support for her.      Barriers   Psychosocial barriers to participate in program  There are no identifiable barriers or psychosocial needs.      Screening Interventions   Interventions  Encouraged to exercise       Quality of Life Scores: Quality of Life - 11/04/17 0920      Quality of Life   Select  Quality of Life      Quality of Life Scores   Health/Function Pre  27.21 %    Socioeconomic Pre  30 %    Psych/Spiritual Pre  29.14 %    Family Pre  27.6 %    GLOBAL Pre  28.22 %      Scores of 19 and below usually indicate a poorer quality of life in these areas.  A difference of  2-3 points is a clinically meaningful difference.  A difference of 2-3 points in the total score of the Quality of Life Index has been associated with significant improvement in overall quality of life, self-image, physical symptoms, and general health in studies assessing change in quality of life.  PHQ-9: Recent Review Flowsheet Data    Depression screen [facility] 2/9 11/10/2017 07/07/2016 03/31/2016 02/13/2016   Decreased Interest 0 0 0 0   Down, Depressed, Hopeless 0 0 0 0   PHQ - 2 Score 0 0 0 0     Interpretation of Total Score  Total Score Depression Severity:  1-4 = Minimal depression, 5-9 = Mild depression,  10-14 = Moderate depression, 15-19 = Moderately severe depression, 20-27 = Severe depression   Psychosocial Evaluation and Intervention:   Psychosocial Re-Evaluation: Psychosocial Re-Evaluation    Row Name 11/23/17 1446 12/23/17 1342 01/20/18 0836         Psychosocial Re-Evaluation   Current issues with  None Identified  None Identified  None Identified     Comments  -  [place] 's  mother in law recently passed away.  Emotional support offered  -     Interventions  Encouraged to attend Cardiac Rehabilitation for the exercise  Encouraged to attend Cardiac Rehabilitation for the exercise  Encouraged to attend Cardiac Rehabilitation for the exercise     Continue Psychosocial Services   No Follow up required  No Follow up required  No Follow up required        Psychosocial Discharge (Final Psychosocial Re-Evaluation): Psychosocial Re-Evaluation - 01/20/18 0836      Psychosocial Re-Evaluation   Current issues with  None Identified    Interventions  Encouraged to attend Cardiac Rehabilitation for the exercise    Continue Psychosocial Services   No Follow up required       Vocational Rehabilitation: Provide vocational rehab assistance to qualifying candidates.  Vocational Rehab Evaluation & Intervention: Vocational Rehab - 11/04/17 1131      Initial Vocational Rehab Evaluation & Intervention   Assessment shows need for Vocational Rehabilitation  No       Education: Education Goals: Education classes will be provided on a weekly basis, covering required topics. Participant will state understanding/return demonstration of topics presented.  Learning Barriers/Preferences: Learning Barriers/Preferences - 11/04/17 0905      Learning Barriers/Preferences   Learning Barriers  None    Learning Preferences  Video;Pictoral;Written Material;Computer/Internet       Education Topics: Count Your Pulse:  -Group instruction provided by verbal instruction, demonstration, patient  participation and written materials to support subject.  Instructors address importance of being able to find your pulse and how to count your pulse when at home without a heart monitor.  Patients get hands on experience counting their pulse with staff help and individually.   Heart Attack, Angina, and Risk Factor Modification:  -Group instruction provided by verbal instruction, video, and written materials to support subject.  Instructors address signs and symptoms of angina and heart attacks.    Also discuss risk factors for heart disease and how to make changes to improve heart health risk factors.   Functional Fitness:  -Group instruction provided by verbal instruction, demonstration, patient participation, and written materials to support subject.  Instructors address safety measures for doing things around the house.  Discuss how to get up and down off the floor, how to pick things up properly, how to safely get out of a chair without assistance, and balance training.   Meditation and Mindfulness:  -Group instruction provided by verbal instruction, patient participation, and written materials to support subject.  Instructor addresses importance of mindfulness and meditation practice to help reduce stress and improve awareness.  Instructor also leads participants through a meditation exercise.    Stretching for Flexibility and Mobility:  -Group instruction provided by verbal instruction, patient participation, and written materials to support subject.  Instructors lead participants through series of stretches that are designed to increase flexibility thus improving mobility.  These stretches are additional exercise for major muscle groups that are typically performed during regular warm up and cool down.   Hands Only CPR:  -Group verbal, video, and participation provides a basic overview of AHA guidelines for community CPR. Role-play of emergencies allow participants the opportunity to  practice calling for help and chest compression technique with discussion of AED use.   Hypertension: -Group verbal and written instruction that provides a basic overview of hypertension including the most recent diagnostic guidelines, risk factor reduction with self-care instructions and medication management.    Nutrition I class: Heart Healthy Eating:  -Group instruction provided by PowerPoint slides, verbal discussion, and written materials to support subject matter. The instructor gives an explanation and review of the Therapeutic Lifestyle Changes diet recommendations, which includes a discussion on lipid goals, dietary fat, sodium, fiber, plant stanol/sterol esters, sugar, and the components of a well-balanced, healthy diet.   Nutrition II class: Lifestyle Skills:  -Group instruction provided by PowerPoint slides, verbal discussion, and written materials to support subject matter. The instructor gives an explanation and review of label reading, grocery shopping for heart health, heart healthy recipe modifications, and ways to make healthier choices when eating out.   Diabetes Question & Answer:  -Group instruction provided by PowerPoint slides, verbal discussion, and written materials to support subject matter. The instructor gives an explanation and review of diabetes co-morbidities, pre- and post-prandial blood glucose  goals, pre-exercise blood glucose goals, signs, symptoms, and treatment of hypoglycemia and hyperglycemia, and foot care basics.   Diabetes Blitz:  -Group instruction provided by PowerPoint slides, verbal discussion, and written materials to support subject matter. The instructor gives an explanation and review of the physiology behind type 1 and type 2 diabetes, diabetes medications and rational behind using different medications, pre- and post-prandial blood glucose recommendations and Hemoglobin A1c goals, diabetes diet, and exercise including blood glucose guidelines  for exercising safely.    Portion Distortion:  -Group instruction provided by PowerPoint slides, verbal discussion, written materials, and food models to support subject matter. The instructor gives an explanation of serving size versus portion size, changes in portions sizes over the last 20 years, and what consists of a serving from each food group.   Stress Management:  -Group instruction provided by verbal instruction, video, and written materials to support subject matter.  Instructors review role of stress in heart disease and how to cope with stress positively.     CARDIAC REHAB PHASE II EXERCISE from 11/10/2017 in [place]  Date  11/10/17  Educator  RN  Instruction Review Code  2- Demonstrated Understanding      Exercising on Your Own:  -Group instruction provided by verbal instruction, power point, and written materials to support subject.  Instructors discuss benefits of exercise, components of exercise, frequency and intensity of exercise, and end points for exercise.  Also discuss use of nitroglycerin and activating EMS.  Review options of places to exercise outside of rehab.  Review guidelines for sex with heart disease.   Cardiac Drugs I:  -Group instruction provided by verbal instruction and written materials to support subject.  Instructor reviews cardiac drug classes: antiplatelets, anticoagulants, beta blockers, and statins.  Instructor discusses reasons, side effects, and lifestyle considerations for each drug class.   Cardiac Drugs II:  -Group instruction provided by verbal instruction and written materials to support subject.  Instructor reviews cardiac drug classes: angiotensin converting enzyme inhibitors (ACE-I), angiotensin II receptor blockers (ARBs), nitrates, and calcium channel blockers.  Instructor discusses reasons, side effects, and lifestyle considerations for each drug class.   Anatomy and Physiology of the Circulatory  System:  Group verbal and written instruction and models provide basic cardiac anatomy and physiology, with the coronary electrical and arterial systems. Review of: AMI, Angina, Valve disease, Heart Failure, Peripheral Artery Disease, Cardiac Arrhythmia, Pacemakers, and the ICD.   Other Education:  -Group or individual verbal, written, or video instructions that support the educational goals of the cardiac rehab program.   Holiday Eating Survival Tips:  -Group instruction provided by PowerPoint slides, verbal discussion, and written materials to support subject matter. The instructor gives patients tips, tricks, and techniques to help them not only survive but enjoy the holidays despite the onslaught of food that accompanies the holidays.   Knowledge Questionnaire Score: Knowledge Questionnaire Score - 11/04/17 0920      Knowledge Questionnaire Score   Pre Score  22/24       Core Components/Risk Factors/Patient Goals at Admission: Personal Goals and Risk Factors at Admission - 11/04/17 1050      Core Components/Risk Factors/Patient Goals on Admission   Lipids  Yes    Intervention  Provide education and support for participant on nutrition & aerobic/resistive exercise along with prescribed medications to achieve LDL <19m, HDL >468m    Expected Outcomes  Short Term: Participant states understanding of desired cholesterol values and is compliant with medications  prescribed. Participant is following exercise prescription and nutrition guidelines.;Long Term: Cholesterol controlled with medications as prescribed, with individualized exercise RX and with personalized nutrition plan. Value goals: LDL < 75m, HDL > 40 mg.    Stress  Yes    Intervention  Offer individual and/or small group education and counseling on adjustment to heart disease, stress management and health-related lifestyle change. Teach and support self-help strategies.;Refer participants experiencing significant psychosocial  distress to appropriate mental health specialists for further evaluation and treatment. When possible, include family members and significant others in education/counseling sessions.    Expected Outcomes  Short Term: Participant demonstrates changes in health-related behavior, relaxation and other stress management skills, ability to obtain effective social support, and compliance with psychotropic medications if prescribed.;Long Term: Emotional wellbeing is indicated by absence of clinically significant psychosocial distress or social isolation.       Core Components/Risk Factors/Patient Goals Review:  Goals and Risk Factor Review    Row Name 11/10/17 1042 11/23/17 1442 12/23/17 1344 01/20/18 0837       Core Components/Risk Factors/Patient Goals Review   Personal Goals Review  Hypertension;Lipids  Hypertension;Lipids  Hypertension;Lipids  Hypertension;Lipids    Review  -  Lakecia's vital signs have been stable at cardiac rehab. LVenushas maintained his current weight. LGenealhas not voiced any concerns about any additional stressors.  Geneve's vital signs have been stable at cardiac rehab. LKimbriahas maintained his current weight. LRosabellahas not voiced any concerns about any additional stressors.  Dannette's vital signs have been stable at cardiac rehab. LLonnahas maintained his current weight. LJameliahas not voiced any concerns about any additional stressors.    Expected Outcomes  -  LKariswill contine to participate in phase 2 cardiac rehab, follow nutrition and lifestyle modifications.  LJudithewill contine to participate in phase 2 cardiac rehab, follow nutrition and lifestyle modifications.  LHasetwill contine to participate in phase 2 cardiac rehab, follow nutrition and lifestyle modifications.       Core Components/Risk Factors/Patient Goals at Discharge (Final Review):  Goals and Risk Factor Review - 01/20/18 0837      Core Components/Risk Factors/Patient Goals Review   Personal Goals Review   Hypertension;Lipids    Review  Crystale's vital signs have been stable at cardiac rehab. LAiyannahhas maintained his current weight. LRhiannanhas not voiced any concerns about any additional stressors.    Expected Outcomes  LJozlinwill contine to participate in phase 2 cardiac rehab, follow nutrition and lifestyle modifications.       ITP Comments: ITP Comments    Row Name 11/04/17 0902 11/23/17 1441 12/23/17 1341 01/20/18 0836     ITP Comments  Dr. TFransico Him Medical Director  30 Day ITP Review. Patient with good attendance and participation in phase 2 cardiac rehab  30 Day ITP Review. Patient with good attendance and participation in phase 2 cardiac rehab. LTaziawas out last week due to her mother in [place]  30 Day ITP Review. Patient with good attendance and participation in phase 2 cardiac rehab.        Comments: See ITP comments.MBarnet Pall RN,BSN 01/20/2018 8:40 AM

## 2018-01-21 ENCOUNTER — Ambulatory Visit (HOSPITAL_COMMUNITY): Payer: BLUE CROSS/BLUE SHIELD

## 2018-01-21 ENCOUNTER — Encounter (HOSPITAL_COMMUNITY): Payer: BLUE CROSS/BLUE SHIELD

## 2018-01-24 ENCOUNTER — Ambulatory Visit (HOSPITAL_COMMUNITY): Payer: BLUE CROSS/BLUE SHIELD

## 2018-01-24 ENCOUNTER — Encounter (HOSPITAL_COMMUNITY)
Admission: RE | Admit: 2018-01-24 | Discharge: 2018-01-24 | Disposition: A | Discharge status: Home / Self Care | Payer: BLUE CROSS/BLUE SHIELD | Source: Ambulatory Visit | Attending: Cardiology | Admitting: Cardiology

## 2018-01-24 DIAGNOSIS — Z932 Ileostomy status: Secondary | ICD-10-CM | POA: Diagnosis not present

## 2018-01-24 DIAGNOSIS — Z8679 Personal history of other diseases of the circulatory system: Secondary | ICD-10-CM | POA: Diagnosis not present

## 2018-01-24 DIAGNOSIS — Z79890 Hormone replacement therapy (postmenopausal): Secondary | ICD-10-CM | POA: Diagnosis not present

## 2018-01-24 DIAGNOSIS — Z79899 Other long term (current) drug therapy: Secondary | ICD-10-CM | POA: Diagnosis not present

## 2018-01-24 DIAGNOSIS — K5010 Crohn's disease of large intestine without complications: Secondary | ICD-10-CM | POA: Diagnosis not present

## 2018-01-24 DIAGNOSIS — Z952 Presence of prosthetic heart valve: Secondary | ICD-10-CM

## 2018-01-26 ENCOUNTER — Encounter (HOSPITAL_COMMUNITY)
Admission: RE | Admit: 2018-01-26 | Discharge: 2018-01-26 | Disposition: A | Discharge status: Home / Self Care | Payer: BLUE CROSS/BLUE SHIELD | Source: Ambulatory Visit | Attending: Cardiology | Admitting: Cardiology

## 2018-01-26 ENCOUNTER — Ambulatory Visit (HOSPITAL_COMMUNITY): Payer: BLUE CROSS/BLUE SHIELD

## 2018-01-26 DIAGNOSIS — Z932 Ileostomy status: Secondary | ICD-10-CM | POA: Diagnosis not present

## 2018-01-26 DIAGNOSIS — H01001 Unspecified blepharitis right upper eyelid: Secondary | ICD-10-CM | POA: Diagnosis not present

## 2018-01-26 DIAGNOSIS — Z952 Presence of prosthetic heart valve: Secondary | ICD-10-CM

## 2018-01-26 DIAGNOSIS — Z79890 Hormone replacement therapy (postmenopausal): Secondary | ICD-10-CM | POA: Diagnosis not present

## 2018-01-26 DIAGNOSIS — Z79899 Other long term (current) drug therapy: Secondary | ICD-10-CM | POA: Diagnosis not present

## 2018-01-26 DIAGNOSIS — L719 Rosacea, unspecified: Secondary | ICD-10-CM | POA: Diagnosis not present

## 2018-01-26 DIAGNOSIS — H01002 Unspecified blepharitis right lower eyelid: Secondary | ICD-10-CM | POA: Diagnosis not present

## 2018-01-26 DIAGNOSIS — Z8679 Personal history of other diseases of the circulatory system: Secondary | ICD-10-CM | POA: Diagnosis not present

## 2018-01-26 DIAGNOSIS — K5010 Crohn's disease of large intestine without complications: Secondary | ICD-10-CM | POA: Diagnosis not present

## 2018-01-28 ENCOUNTER — Ambulatory Visit (HOSPITAL_COMMUNITY): Payer: BLUE CROSS/BLUE SHIELD

## 2018-01-28 ENCOUNTER — Encounter (HOSPITAL_COMMUNITY)
Admission: RE | Admit: 2018-01-28 | Discharge: 2018-01-28 | Disposition: A | Discharge status: Home / Self Care | Payer: BLUE CROSS/BLUE SHIELD | Source: Ambulatory Visit | Attending: Cardiology | Admitting: Cardiology

## 2018-01-28 DIAGNOSIS — Z8679 Personal history of other diseases of the circulatory system: Secondary | ICD-10-CM | POA: Diagnosis not present

## 2018-01-28 DIAGNOSIS — K5010 Crohn's disease of large intestine without complications: Secondary | ICD-10-CM | POA: Diagnosis not present

## 2018-01-28 DIAGNOSIS — Z952 Presence of prosthetic heart valve: Secondary | ICD-10-CM

## 2018-01-28 DIAGNOSIS — Z79899 Other long term (current) drug therapy: Secondary | ICD-10-CM | POA: Diagnosis not present

## 2018-01-28 DIAGNOSIS — Z79890 Hormone replacement therapy (postmenopausal): Secondary | ICD-10-CM | POA: Diagnosis not present

## 2018-01-28 DIAGNOSIS — Z932 Ileostomy status: Secondary | ICD-10-CM | POA: Diagnosis not present

## 2018-01-31 ENCOUNTER — Encounter (HOSPITAL_COMMUNITY)
Admission: RE | Admit: 2018-01-31 | Discharge: 2018-01-31 | Disposition: A | Discharge status: Home / Self Care | Payer: BLUE CROSS/BLUE SHIELD | Source: Ambulatory Visit | Attending: Cardiology | Admitting: Cardiology

## 2018-01-31 ENCOUNTER — Ambulatory Visit (HOSPITAL_COMMUNITY): Payer: BLUE CROSS/BLUE SHIELD

## 2018-01-31 DIAGNOSIS — Z79899 Other long term (current) drug therapy: Secondary | ICD-10-CM | POA: Diagnosis not present

## 2018-01-31 DIAGNOSIS — Z952 Presence of prosthetic heart valve: Secondary | ICD-10-CM

## 2018-01-31 DIAGNOSIS — Z932 Ileostomy status: Secondary | ICD-10-CM | POA: Diagnosis not present

## 2018-01-31 DIAGNOSIS — Z8679 Personal history of other diseases of the circulatory system: Secondary | ICD-10-CM | POA: Diagnosis not present

## 2018-01-31 DIAGNOSIS — K5010 Crohn's disease of large intestine without complications: Secondary | ICD-10-CM | POA: Diagnosis not present

## 2018-01-31 DIAGNOSIS — Z79890 Hormone replacement therapy (postmenopausal): Secondary | ICD-10-CM | POA: Diagnosis not present

## 2018-02-01 DIAGNOSIS — F4322 Adjustment disorder with anxiety: Secondary | ICD-10-CM | POA: Diagnosis not present

## 2018-02-02 ENCOUNTER — Ambulatory Visit (HOSPITAL_COMMUNITY): Payer: BLUE CROSS/BLUE SHIELD

## 2018-02-02 ENCOUNTER — Encounter (HOSPITAL_COMMUNITY)
Admission: RE | Admit: 2018-02-02 | Discharge: 2018-02-02 | Disposition: A | Discharge status: Home / Self Care | Payer: BLUE CROSS/BLUE SHIELD | Source: Ambulatory Visit | Attending: Interventional Cardiology | Admitting: Interventional Cardiology

## 2018-02-02 VITALS — Ht 67.0 in | Wt 125.2 lb

## 2018-02-02 DIAGNOSIS — Z79890 Hormone replacement therapy (postmenopausal): Secondary | ICD-10-CM | POA: Diagnosis not present

## 2018-02-02 DIAGNOSIS — Z8679 Personal history of other diseases of the circulatory system: Secondary | ICD-10-CM | POA: Diagnosis not present

## 2018-02-02 DIAGNOSIS — Z932 Ileostomy status: Secondary | ICD-10-CM | POA: Diagnosis not present

## 2018-02-02 DIAGNOSIS — K5010 Crohn's disease of large intestine without complications: Secondary | ICD-10-CM | POA: Diagnosis not present

## 2018-02-02 DIAGNOSIS — Z952 Presence of prosthetic heart valve: Secondary | ICD-10-CM

## 2018-02-02 DIAGNOSIS — Z79899 Other long term (current) drug therapy: Secondary | ICD-10-CM | POA: Diagnosis not present

## 2018-02-04 ENCOUNTER — Ambulatory Visit (HOSPITAL_COMMUNITY): Payer: BLUE CROSS/BLUE SHIELD

## 2018-02-04 ENCOUNTER — Encounter (HOSPITAL_COMMUNITY): Payer: BLUE CROSS/BLUE SHIELD

## 2018-02-07 ENCOUNTER — Encounter (HOSPITAL_COMMUNITY): Payer: BLUE CROSS/BLUE SHIELD

## 2018-02-07 ENCOUNTER — Ambulatory Visit (HOSPITAL_COMMUNITY): Payer: BLUE CROSS/BLUE SHIELD

## 2018-02-09 ENCOUNTER — Encounter (HOSPITAL_COMMUNITY)
Admission: RE | Admit: 2018-02-09 | Discharge: 2018-02-09 | Disposition: A | Discharge status: Home / Self Care | Payer: BLUE CROSS/BLUE SHIELD | Source: Ambulatory Visit | Attending: Cardiology | Admitting: Cardiology

## 2018-02-09 ENCOUNTER — Ambulatory Visit (HOSPITAL_COMMUNITY): Payer: BLUE CROSS/BLUE SHIELD

## 2018-02-09 DIAGNOSIS — Z79890 Hormone replacement therapy (postmenopausal): Secondary | ICD-10-CM | POA: Diagnosis not present

## 2018-02-09 DIAGNOSIS — F4322 Adjustment disorder with anxiety: Secondary | ICD-10-CM | POA: Diagnosis not present

## 2018-02-09 DIAGNOSIS — Z79899 Other long term (current) drug therapy: Secondary | ICD-10-CM | POA: Diagnosis not present

## 2018-02-09 DIAGNOSIS — Z8679 Personal history of other diseases of the circulatory system: Secondary | ICD-10-CM | POA: Diagnosis not present

## 2018-02-09 DIAGNOSIS — K5010 Crohn's disease of large intestine without complications: Secondary | ICD-10-CM | POA: Diagnosis not present

## 2018-02-09 DIAGNOSIS — Z952 Presence of prosthetic heart valve: Secondary | ICD-10-CM

## 2018-02-09 DIAGNOSIS — Z932 Ileostomy status: Secondary | ICD-10-CM | POA: Diagnosis not present

## 2018-02-11 ENCOUNTER — Ambulatory Visit (HOSPITAL_COMMUNITY): Payer: BLUE CROSS/BLUE SHIELD

## 2018-02-14 ENCOUNTER — Encounter (HOSPITAL_COMMUNITY)
Admission: RE | Admit: 2018-02-14 | Discharge: 2018-02-14 | Disposition: A | Discharge status: Home / Self Care | Payer: BLUE CROSS/BLUE SHIELD | Source: Ambulatory Visit | Attending: Cardiology | Admitting: Cardiology

## 2018-02-14 ENCOUNTER — Ambulatory Visit (HOSPITAL_COMMUNITY): Payer: BLUE CROSS/BLUE SHIELD

## 2018-02-14 VITALS — BP 122/70 | HR 90 | Ht 67.0 in | Wt 123.5 lb

## 2018-02-14 DIAGNOSIS — Z79890 Hormone replacement therapy (postmenopausal): Secondary | ICD-10-CM | POA: Diagnosis not present

## 2018-02-14 DIAGNOSIS — Z79899 Other long term (current) drug therapy: Secondary | ICD-10-CM | POA: Diagnosis not present

## 2018-02-14 DIAGNOSIS — Z8679 Personal history of other diseases of the circulatory system: Secondary | ICD-10-CM | POA: Diagnosis not present

## 2018-02-14 DIAGNOSIS — K5010 Crohn's disease of large intestine without complications: Secondary | ICD-10-CM | POA: Diagnosis not present

## 2018-02-14 DIAGNOSIS — Z952 Presence of prosthetic heart valve: Secondary | ICD-10-CM

## 2018-02-14 DIAGNOSIS — Z932 Ileostomy status: Secondary | ICD-10-CM | POA: Diagnosis not present

## 2018-02-14 NOTE — Progress Notes (Signed)
Discharge Progress Report  Patient Details  Name: Brittany Whitney MRN: [id number] Date of Birth: 1955/06/10 Referring Provider:     CARDIAC REHAB PHASE II ORIENTATION from 11/04/2017 in [place]  Referring Provider  Adrian Prows, MD       Number of Visits: 29  Reason for Discharge:  Patient reached a stable level of exercise.  Smoking History:  Social History   Tobacco Use  Smoking Status Never Smoker  Smokeless Tobacco Never Used    Diagnosis:  08/10/17 S/P AVR (repair) at [place]:   Initial Exercise Prescription: Initial Exercise Prescription - 11/04/17 1100      Date of Initial Exercise RX and Referring Provider   Date  11/04/17    Referring Provider  Adrian Prows, MD      Treadmill   MPH  3.3    Grade  1    Minutes  10    METs  3.98      Bike   Level  1    Minutes  10    METs  4.43      NuStep   Level  4    SPM  90    Minutes  10    METs  3      Prescription Details   Frequency (times per week)  3    Duration  Progress to 30 minutes of continuous aerobic without signs/symptoms of physical distress      Intensity   THRR 40-80% of Max Heartrate  63-126    Ratings of Perceived Exertion  11-13    Perceived Dyspnea  0-4      Progression   Progression  Continue to progress workloads to maintain intensity without signs/symptoms of physical distress.      Resistance Training   Training Prescription  Yes    Weight  3lbs    Reps  10-15       Discharge Exercise Prescription (Final Exercise Prescription Changes): Exercise Prescription Changes - 02/14/18 0952      Response to Exercise   Blood Pressure (Admit)  122/70    Blood Pressure (Exercise)  120/80    Blood Pressure (Exit)  120/78    Heart Rate (Admit)  90 bpm    Heart Rate (Exercise)  142 bpm    Heart Rate (Exit)  87 bpm    Rating of Perceived Exertion (Exercise)  12    Symptoms  none    Duration  Progress to 30 minutes of  aerobic without  signs/symptoms of physical distress    Intensity  THRR unchanged      Progression   Progression  Continue to progress workloads to maintain intensity without signs/symptoms of physical distress.    Average METs  5.5      Resistance Training   Training Prescription  Yes    Weight  8lbs   8lbs left hand, 3lbs right hand   Reps  10-15    Time  10 Minutes      Interval Training   Interval Training  No      Treadmill   MPH  3.5    Grade  3    Minutes  10    METs  5.13      NuStep   Level  7    SPM  90    Minutes  10    METs  4.8      Rower   Level  7  Watts  50    Minutes  10    METs  6.5      Home Exercise Plan   Plans to continue exercise at  Home (comment)   Patient plans to resume exercise at gym eventually.   Frequency  Add 2 additional days to program exercise sessions.    Initial Home Exercises Provided  11/15/17       Functional Capacity: 6 Minute Walk    Row Name 11/04/17 1046 02/02/18 1132       6 Minute Walk   Phase  Initial  Discharge    Distance  1964 feet  2116 feet    Distance % Change  -  7.74 %    Distance Feet Change  -  152 ft    Walk Time  6 minutes  6 minutes    # of Rest Breaks  0  0    MPH  3.7  4    METS  4.9  5.57    RPE  5.1  11    VO2 Peak  17.3  -    Symptoms  No  No    Resting HR  81 bpm  87 bpm    Resting BP  98/64  100/68    Resting Oxygen Saturation   99 %  -    Exercise Oxygen Saturation  during 6 min walk  98 %  -    Max Ex. HR  123 bpm  132 bpm    Max Ex. BP  110/66  142/78    2 Minute Post BP  104/70  122/80       Psychological, QOL, Others - Outcomes: PHQ 2/9: Depression screen [facility] 2/9 02/14/2018 11/10/2017 07/07/2016 03/31/2016 02/13/2016  Decreased Interest 0 0 0 0 0  Down, Depressed, Hopeless 0 0 0 0 0  PHQ - 2 Score 0 0 0 0 0    Quality of Life: Quality of Life - 02/14/18 1147      Quality of Life   Select  Quality of Life      Quality of Life Scores   Health/Function Pre  27.21 %     Health/Function Post  26.25 %    Health/Function % Change  -3.53 %    Socioeconomic Pre  30 %    Socioeconomic Post  28.75 %    Socioeconomic % Change   -4.17 %    Psych/Spiritual Pre  29.14 %    Psych/Spiritual Post  29.14 %    Psych/Spiritual % Change  0 %    Family Pre  27.6 %    Family Post  28.8 %    Family % Change  4.35 %    GLOBAL Pre  28.22 %    GLOBAL Post  27.75 %    GLOBAL % Change  -1.67 %       Personal Goals: Goals established at orientation with interventions provided to work toward goal. Personal Goals and Risk Factors at Admission - 11/04/17 1050      Core Components/Risk Factors/Patient Goals on Admission   Lipids  Yes    Intervention  Provide education and support for participant on nutrition & aerobic/resistive exercise along with prescribed medications to achieve LDL <56m, HDL >449m    Expected Outcomes  Short Term: Participant states understanding of desired cholesterol values and is compliant with medications prescribed. Participant is following exercise prescription and nutrition guidelines.;Long Term: Cholesterol controlled with medications as prescribed, with individualized exercise RX and with  personalized nutrition plan. Value goals: LDL < 61m, HDL > 40 mg.    Stress  Yes    Intervention  Offer individual and/or small group education and counseling on adjustment to heart disease, stress management and health-related lifestyle change. Teach and support self-help strategies.;Refer participants experiencing significant psychosocial distress to appropriate mental health specialists for further evaluation and treatment. When possible, include family members and significant others in education/counseling sessions.    Expected Outcomes  Short Term: Participant demonstrates changes in health-related behavior, relaxation and other stress management skills, ability to obtain effective social support, and compliance with psychotropic medications if prescribed.;Long  Term: Emotional wellbeing is indicated by absence of clinically significant psychosocial distress or social isolation.        Personal Goals Discharge: Goals and Risk Factor Review    Row Name 11/10/17 1042 11/23/17 1442 12/23/17 1344 01/20/18 0837 02/14/18 1044     Core Components/Risk Factors/Patient Goals Review   Personal Goals Review  Hypertension;Lipids  Hypertension;Lipids  Hypertension;Lipids  Hypertension;Lipids  Hypertension;Lipids   Review  -  Miliyah's vital signs have been stable at cardiac rehab. LTyteannahas maintained his current weight. LSarelyhas not voiced any concerns about any additional stressors.  Jenai's vital signs have been stable at cardiac rehab. LShanekiahas maintained his current weight. LIniyahas not voiced any concerns about any additional stressors.  Enya's vital signs have been stable at cardiac rehab. LJaquayhas maintained his current weight. LAashvihas not voiced any concerns about any additional stressors.  Olive's vital signs have been stable at cardiac rehab. LTemiahas maintained his current weight. LNevillehas not voiced any concerns about any additional stressors.   Expected Outcomes  -  LMystiewill contine to participate in phase 2 cardiac rehab, follow nutrition and lifestyle modifications.  LRhyswill contine to participate in phase 2 cardiac rehab, follow nutrition and lifestyle modifications.  LMakenzeewill contine to participate in phase 2 cardiac rehab, follow nutrition and lifestyle modifications.  [place]ill [place] from phase 2 cardiac rehab., follow nutrition and lifestyle modifications.      Exercise Goals and Review: Exercise Goals    Row Name 11/04/17 0905             Exercise Goals   Increase Physical Activity  Yes       Intervention  Provide advice, education, support and counseling about physical activity/exercise needs.;Develop an individualized exercise prescription for aerobic and resistive training  based on initial evaluation findings, risk stratification, comorbidities and participant's personal goals.       Expected Outcomes  Short Term: Attend rehab on a regular basis to increase amount of physical activity.;Long Term: Add in home exercise to make exercise part of routine and to increase amount of physical activity.;Long Term: Exercising regularly at least 3-5 days a week.       Increase Strength and Stamina  Yes       Intervention  Provide advice, education, support and counseling about physical activity/exercise needs.;Develop an individualized exercise prescription for aerobic and resistive training based on initial evaluation findings, risk stratification, comorbidities and participant's personal goals.       Expected Outcomes  Short Term: Increase workloads from initial exercise prescription for resistance, speed, and METs.;Short Term: Perform resistance training exercises routinely during rehab and add in resistance training at home;Long Term: Improve cardiorespiratory fitness, muscular endurance and strength as measured by increased METs and functional capacity (6MWT)  Able to understand and use rate of perceived exertion (RPE) scale  Yes       Intervention  Provide education and explanation on how to use RPE scale       Expected Outcomes  Short Term: Able to use RPE daily in rehab to express subjective intensity level;Long Term:  Able to use RPE to guide intensity level when exercising independently       Knowledge and understanding of Target Heart Rate Range (THRR)  Yes       Intervention  Provide education and explanation of THRR including how the numbers were predicted and where they are located for reference       Expected Outcomes  Short Term: Able to state/look up THRR;Long Term: Able to use THRR to govern intensity when exercising independently;Short Term: Able to use daily as guideline for intensity in rehab       Able to check pulse independently  Yes       Intervention   Provide education and demonstration on how to check pulse in carotid and radial arteries.;Review the importance of being able to check your own pulse for safety during independent exercise       Expected Outcomes  Short Term: Able to explain why pulse checking is important during independent exercise;Long Term: Able to check pulse independently and accurately       Understanding of Exercise Prescription  Yes       Intervention  Provide education, explanation, and written materials on patient's individual exercise prescription       Expected Outcomes  Long Term: Able to explain home exercise prescription to exercise independently;Short Term: Able to explain program exercise prescription          Nutrition & Weight - Outcomes: Pre Biometrics - 11/04/17 1049      Pre Biometrics   Height  5' 7"  (1.702 m)    Weight  124 lb 9 oz (56.5 kg)    Waist Circumference  28 inches    Hip Circumference  35 inches    Waist to Hip Ratio  0.8 %    BMI (Calculated)  19.5    Triceps Skinfold  22 mm    % Body Fat  30.4 %    Grip Strength  32 kg    Flexibility  18 in    Single Leg Stand  30 seconds      Post Biometrics - 02/14/18 1050       Post  Biometrics   Height  5' 7"  (1.702 m)    Weight  123 lb 7.3 oz (56 kg)    Waist Circumference  28 inches    Hip Circumference  36 inches    Waist to Hip Ratio  0.78 %    BMI (Calculated)  19.33    Triceps Skinfold  20 mm    % Body Fat  29.8 %    Grip Strength  29 kg    Flexibility  22 in    Single Leg Stand  30 seconds       Nutrition: Nutrition Therapy & Goals - 02/15/18 1516      Nutrition Therapy   Diet  general, healthful      Personal Nutrition Goals   Nutrition Goal  Pt to describe the benefit of including fruits, vegetables, whole grains, and low-fat dairy products in a general healthful meal plan.      Intervention Plan   Intervention  Prescribe, educate and counsel regarding individualized specific dietary modifications  aiming towards  targeted core components such as weight, hypertension, lipid management, diabetes, heart failure and other comorbidities.    Expected Outcomes  Short Term Goal: Understand basic principles of dietary content, such as calories, fat, sodium, cholesterol and nutrients.;Long Term Goal: Adherence to prescribed nutrition plan.       Nutrition Discharge: Nutrition Assessments - 02/15/18 1516      MEDFICTS Scores   Pre Score  23    Post Score  10    Score Difference  -13       Education Questionnaire Score: Knowledge Questionnaire Score - 02/14/18 0945      Knowledge Questionnaire Score   Pre Score  22/24    Post Score  24/24       Goals reviewed with patient; copy given to patient.Valree graduated from cardiac rehab program on 02/14/18 with completion of 36 exercise sessions in Phase II. Pt maintained good attendance and progressed nicely during his participation in rehab as evidenced by increased MET level.   Medication list reconciled. Repeat  PHQ score-  .  Pt has made significant lifestyle changes and should be commended for hersuccess. Pt feels s has achieved her goals during cardiac rehab.   [place].We are proud of Aerionna's progress. Reya increased her distance on her post exercise walk test and maintained her current weight.Barnet Pall, RN,BSN 03/16/2018 4:38 PM

## 2018-02-16 ENCOUNTER — Ambulatory Visit (HOSPITAL_COMMUNITY): Payer: BLUE CROSS/BLUE SHIELD

## 2018-02-17 DIAGNOSIS — M25531 Pain in right wrist: Secondary | ICD-10-CM | POA: Diagnosis not present

## 2018-02-18 ENCOUNTER — Ambulatory Visit (HOSPITAL_COMMUNITY): Payer: BLUE CROSS/BLUE SHIELD

## 2018-02-21 ENCOUNTER — Ambulatory Visit (HOSPITAL_COMMUNITY): Payer: BLUE CROSS/BLUE SHIELD

## 2018-02-23 ENCOUNTER — Ambulatory Visit (HOSPITAL_COMMUNITY): Payer: BLUE CROSS/BLUE SHIELD

## 2018-02-23 DIAGNOSIS — F4322 Adjustment disorder with anxiety: Secondary | ICD-10-CM | POA: Diagnosis not present

## 2018-02-25 ENCOUNTER — Ambulatory Visit (HOSPITAL_COMMUNITY): Payer: BLUE CROSS/BLUE SHIELD

## 2018-02-28 ENCOUNTER — Ambulatory Visit (HOSPITAL_COMMUNITY): Payer: BLUE CROSS/BLUE SHIELD

## 2018-03-02 ENCOUNTER — Ambulatory Visit (HOSPITAL_COMMUNITY): Payer: BLUE CROSS/BLUE SHIELD

## 2018-03-09 DIAGNOSIS — F4322 Adjustment disorder with anxiety: Secondary | ICD-10-CM | POA: Diagnosis not present

## 2018-03-10 DIAGNOSIS — F4322 Adjustment disorder with anxiety: Secondary | ICD-10-CM | POA: Diagnosis not present

## 2018-03-14 DIAGNOSIS — M25531 Pain in right wrist: Secondary | ICD-10-CM | POA: Diagnosis not present

## 2018-03-16 DIAGNOSIS — M65831 Other synovitis and tenosynovitis, right forearm: Secondary | ICD-10-CM | POA: Diagnosis not present

## 2018-03-16 DIAGNOSIS — M25531 Pain in right wrist: Secondary | ICD-10-CM | POA: Diagnosis not present

## 2018-03-16 DIAGNOSIS — M79641 Pain in right hand: Secondary | ICD-10-CM | POA: Diagnosis not present

## 2018-03-16 NOTE — Addendum Note (Signed)
Encounter addended by: Ivonne Andrew, RD on: 03/16/2018 7:59 AM  Actions taken: Visit Navigator Flowsheet section accepted

## 2018-03-21 DIAGNOSIS — Z932 Ileostomy status: Secondary | ICD-10-CM | POA: Diagnosis not present

## 2018-03-21 DIAGNOSIS — K5090 Crohn's disease, unspecified, without complications: Secondary | ICD-10-CM | POA: Diagnosis not present

## 2018-03-25 DIAGNOSIS — E782 Mixed hyperlipidemia: Secondary | ICD-10-CM | POA: Diagnosis not present

## 2018-03-25 DIAGNOSIS — E039 Hypothyroidism, unspecified: Secondary | ICD-10-CM | POA: Diagnosis not present

## 2018-03-25 DIAGNOSIS — E559 Vitamin D deficiency, unspecified: Secondary | ICD-10-CM | POA: Diagnosis not present

## 2018-04-05 DIAGNOSIS — M2550 Pain in unspecified joint: Secondary | ICD-10-CM | POA: Diagnosis not present

## 2018-04-05 DIAGNOSIS — M79601 Pain in right arm: Secondary | ICD-10-CM | POA: Diagnosis not present

## 2018-04-05 DIAGNOSIS — M25531 Pain in right wrist: Secondary | ICD-10-CM | POA: Diagnosis not present

## 2018-04-05 DIAGNOSIS — M542 Cervicalgia: Secondary | ICD-10-CM | POA: Diagnosis not present

## 2018-04-21 ENCOUNTER — Other Ambulatory Visit: Payer: Self-pay | Admitting: Sports Medicine

## 2018-04-21 MED ORDER — TRAMADOL HCL 50 MG PO TABS: ORAL_TABLET | ORAL | 2 refills | Status: DC

## 2018-05-04 DIAGNOSIS — E559 Vitamin D deficiency, unspecified: Secondary | ICD-10-CM | POA: Diagnosis not present

## 2018-05-04 DIAGNOSIS — E039 Hypothyroidism, unspecified: Secondary | ICD-10-CM | POA: Diagnosis not present

## 2018-05-04 DIAGNOSIS — Z681 Body mass index (BMI) 19 or less, adult: Secondary | ICD-10-CM | POA: Diagnosis not present

## 2018-05-04 DIAGNOSIS — E782 Mixed hyperlipidemia: Secondary | ICD-10-CM | POA: Diagnosis not present

## 2018-05-05 ENCOUNTER — Other Ambulatory Visit: Payer: Self-pay

## 2018-05-05 MED ORDER — MELOXICAM 15 MG PO TABS: 15.0000 mg | ORAL_TABLET | Freq: Every day | ORAL | 2 refills | Status: DC

## 2018-05-11 DIAGNOSIS — M13839 Other specified arthritis, unspecified wrist: Secondary | ICD-10-CM | POA: Diagnosis not present

## 2018-05-11 DIAGNOSIS — M79641 Pain in right hand: Secondary | ICD-10-CM | POA: Diagnosis not present

## 2018-05-11 DIAGNOSIS — M65831 Other synovitis and tenosynovitis, right forearm: Secondary | ICD-10-CM | POA: Diagnosis not present

## 2018-05-11 DIAGNOSIS — M25531 Pain in right wrist: Secondary | ICD-10-CM | POA: Diagnosis not present

## 2018-05-30 DIAGNOSIS — Z01419 Encounter for gynecological examination (general) (routine) without abnormal findings: Secondary | ICD-10-CM | POA: Diagnosis not present

## 2018-05-30 DIAGNOSIS — Z6820 Body mass index (BMI) 20.0-20.9, adult: Secondary | ICD-10-CM | POA: Diagnosis not present

## 2018-06-07 ENCOUNTER — Encounter

## 2018-06-07 ENCOUNTER — Ambulatory Visit (INDEPENDENT_AMBULATORY_CARE_PROVIDER_SITE_OTHER): Payer: BLUE CROSS/BLUE SHIELD | Admitting: Sports Medicine

## 2018-06-07 ENCOUNTER — Encounter: Payer: Self-pay | Admitting: Sports Medicine

## 2018-06-07 ENCOUNTER — Ambulatory Visit: Payer: Self-pay

## 2018-06-07 VITALS — BP 106/83 | Ht 67.0 in | Wt 123.0 lb

## 2018-06-07 DIAGNOSIS — M25531 Pain in right wrist: Secondary | ICD-10-CM | POA: Diagnosis not present

## 2018-06-07 DIAGNOSIS — M67431 Ganglion, right wrist: Secondary | ICD-10-CM | POA: Diagnosis not present

## 2018-06-07 NOTE — Progress Notes (Addendum)
  MILITZA DEVERY - 63 y.o. female MRN [id number]  Date of birth: 11/25/1955  SUBJECTIVE:  Including CC & ROS.  Monie is a 63 year old female presenting for right wrist ulnar pain.  She is here for a second opinion.  Pain began gradually approximately 6 months ago.  She was seen at [facility] with Dr. Amedeo Plenty and underwent MRI of the right wrist (images not available).  Patient was told she had multiple ganglion cysts which have been drained and injected with corticosteroids approximately 5 times with her last injection back in September.  She does report intermittent edema and pain primarily with lifting heavy objects with her right wrist.  She denies any fevers or chills, motor weakness, or sensory loss.  She did have concerns that she could be subluxating and was told she may need surgery to fix her ECU.  She does not work and is right-handed.  She wants to avoid surgery.  She is active with activities including yoga and Pilates.   HISTORY: Past Medical, Surgical, Social, and Family History Reviewed & Updated per EMR.   Pertinent Historical Findings include: Reviewed.  History of Crohn's status post total colectomy.  Recent ascending aorta valve replacement.  PHYSICAL EXAM:  VS: BP:106/83  HR: bpm  TEMP: ( )  RESP:   HT:5' 7"  (170.2 cm)   WT:123 lb (55.8 kg)  BMI:19.26 PHYSICAL EXAM: General: well nourished, well developed, NAD with non-toxic appearance HEENT: normocephalic, atraumatic, moist mucous membranes Lungs: normal work of breathing Skin: warm, dry, no rashes or lesions, cap refill < 2 seconds Extremities: warm and well perfused, normal tone, no edema MSK: right head of the ulna is larger compared to the left with mild tenderness to palpation primarily on dorsal surface with fluctuation on compression, ROM remains intact on right wrist and all of her digits of the right hand, there was some tenderness elicited with resistance against supination, neurovascular intact  The ultrasound  of right wrist identified 6 compartments of the wrist which all appear to be intact.  The sixth compartment did have significant ganglion cyst encompassing the ulnar extensor surface but did not invade the compartment itself.  ASSESSMENT & PLAN: See problem based charting & AVS for pt instructions.  Ganglion cyst of wrist, right Chronic.  Has undergone multiple ganglion aspirations with corticosteroid injections.  There was a large ganglion cyst unified with ultrasound surrounding the ECU which is likely contributing to her pain and popping sensation.  The tendon itself appeared to be intact. - Discussed conservative management with wrist brace, icing, Voltaren gel - RTC as needed   Addendum  Ultrasound of Right Wrist Compartments 1 through 5 are normal Compartment 6 shows intact ECU tendon On palmar surface there is significant hypoechoic change with cystic structure measuring 1.5 cm in length and ~0.3 cm. Width.  This displaces the ECU tendon dorsally.  Impression:  Large ganglion at compartment 6 of right wrist  Ultrasound and interpretation by Wolfgang Phoenix. Fields, MD  I observed and examined the patient with the resident and agree with assessment and plan.  Note reviewed and modified by me. Ila Mcgill, MD

## 2018-06-07 NOTE — Patient Instructions (Signed)
Thank you for coming in to see Brittany Whitney today. Please see below to review our plan for today's visit.  Believe a lot of your symptoms are due to a fairly large ganglion cyst.  This can be managed nonsurgically which is primarily avoiding movement of the right wrist.  Using the brace will help prevent this.  You can continue to exercise, but to try to avoid activating the wrist.  Continue Voltaren gel and ice regularly, however do not apply ice directly to the skin.  Follow-up with Brittany Whitney as needed.  [name], [place], PGY-3

## 2018-06-07 NOTE — Assessment & Plan Note (Addendum)
Chronic.  Has undergone multiple ganglion aspirations with corticosteroid injections.  There was a large ganglion cyst unified with ultrasound surrounding the ECU which is likely contributing to her pain and popping sensation.  The tendon itself appeared to be intact. - Discussed conservative management with wrist brace, icing, Voltaren gel - RTC as needed

## 2018-06-08 DIAGNOSIS — N83202 Unspecified ovarian cyst, left side: Secondary | ICD-10-CM | POA: Diagnosis not present

## 2018-06-08 DIAGNOSIS — N83201 Unspecified ovarian cyst, right side: Secondary | ICD-10-CM | POA: Diagnosis not present

## 2018-06-21 DIAGNOSIS — Z932 Ileostomy status: Secondary | ICD-10-CM | POA: Diagnosis not present

## 2018-06-21 DIAGNOSIS — K5090 Crohn's disease, unspecified, without complications: Secondary | ICD-10-CM | POA: Diagnosis not present

## 2018-06-22 DIAGNOSIS — E039 Hypothyroidism, unspecified: Secondary | ICD-10-CM | POA: Diagnosis not present

## 2018-06-29 DIAGNOSIS — E039 Hypothyroidism, unspecified: Secondary | ICD-10-CM | POA: Diagnosis not present

## 2018-06-29 DIAGNOSIS — G4700 Insomnia, unspecified: Secondary | ICD-10-CM | POA: Diagnosis not present

## 2018-06-29 DIAGNOSIS — F411 Generalized anxiety disorder: Secondary | ICD-10-CM | POA: Diagnosis not present

## 2018-06-29 DIAGNOSIS — Z681 Body mass index (BMI) 19 or less, adult: Secondary | ICD-10-CM | POA: Diagnosis not present

## 2018-07-14 ENCOUNTER — Other Ambulatory Visit: Payer: Self-pay | Admitting: Cardiology

## 2018-07-20 DIAGNOSIS — Z20828 Contact with and (suspected) exposure to other viral communicable diseases: Secondary | ICD-10-CM | POA: Diagnosis not present

## 2018-07-20 DIAGNOSIS — J069 Acute upper respiratory infection, unspecified: Secondary | ICD-10-CM | POA: Diagnosis not present

## 2018-07-20 DIAGNOSIS — Z719 Counseling, unspecified: Secondary | ICD-10-CM | POA: Diagnosis not present

## 2018-07-20 DIAGNOSIS — K5090 Crohn's disease, unspecified, without complications: Secondary | ICD-10-CM | POA: Diagnosis not present

## 2018-07-25 DIAGNOSIS — Z20828 Contact with and (suspected) exposure to other viral communicable diseases: Secondary | ICD-10-CM | POA: Diagnosis not present

## 2018-08-15 DIAGNOSIS — L570 Actinic keratosis: Secondary | ICD-10-CM | POA: Diagnosis not present

## 2018-08-15 DIAGNOSIS — Z85820 Personal history of malignant melanoma of skin: Secondary | ICD-10-CM | POA: Diagnosis not present

## 2018-08-15 DIAGNOSIS — L905 Scar conditions and fibrosis of skin: Secondary | ICD-10-CM | POA: Diagnosis not present

## 2018-08-15 DIAGNOSIS — Z85828 Personal history of other malignant neoplasm of skin: Secondary | ICD-10-CM | POA: Diagnosis not present

## 2018-08-15 DIAGNOSIS — L820 Inflamed seborrheic keratosis: Secondary | ICD-10-CM | POA: Diagnosis not present

## 2018-08-24 DIAGNOSIS — M2550 Pain in unspecified joint: Secondary | ICD-10-CM | POA: Diagnosis not present

## 2018-08-24 DIAGNOSIS — M359 Systemic involvement of connective tissue, unspecified: Secondary | ICD-10-CM | POA: Diagnosis not present

## 2018-09-09 ENCOUNTER — Other Ambulatory Visit: Payer: Self-pay | Admitting: Cardiology

## 2018-09-09 DIAGNOSIS — E782 Mixed hyperlipidemia: Secondary | ICD-10-CM

## 2018-09-09 MED ORDER — OMEGA-3 1000 MG PO CAPS: 1.0000 | ORAL_CAPSULE | Freq: Two times a day (BID) | ORAL | 3 refills | Status: DC

## 2018-09-12 ENCOUNTER — Other Ambulatory Visit: Payer: Self-pay | Admitting: Cardiology

## 2018-09-12 DIAGNOSIS — E782 Mixed hyperlipidemia: Secondary | ICD-10-CM

## 2018-09-12 MED ORDER — OMEGA-3 1000 MG PO CAPS: 1.0000 | ORAL_CAPSULE | Freq: Two times a day (BID) | ORAL | 3 refills | Status: DC

## 2018-09-13 DIAGNOSIS — Z03818 Encounter for observation for suspected exposure to other biological agents ruled out: Secondary | ICD-10-CM | POA: Diagnosis not present

## 2018-09-14 ENCOUNTER — Other Ambulatory Visit: Payer: Self-pay | Admitting: *Deleted

## 2018-09-14 MED ORDER — TRAMADOL HCL 50 MG PO TABS: ORAL_TABLET | ORAL | 1 refills | Status: DC

## 2018-10-24 DIAGNOSIS — K5090 Crohn's disease, unspecified, without complications: Secondary | ICD-10-CM | POA: Diagnosis not present

## 2018-10-24 DIAGNOSIS — Z932 Ileostomy status: Secondary | ICD-10-CM | POA: Diagnosis not present

## 2018-10-25 DIAGNOSIS — K5090 Crohn's disease, unspecified, without complications: Secondary | ICD-10-CM | POA: Diagnosis not present

## 2018-10-25 DIAGNOSIS — Z932 Ileostomy status: Secondary | ICD-10-CM | POA: Diagnosis not present

## 2018-11-01 DIAGNOSIS — M542 Cervicalgia: Secondary | ICD-10-CM | POA: Diagnosis not present

## 2018-11-01 DIAGNOSIS — M2550 Pain in unspecified joint: Secondary | ICD-10-CM | POA: Diagnosis not present

## 2018-11-01 DIAGNOSIS — R5383 Other fatigue: Secondary | ICD-10-CM | POA: Diagnosis not present

## 2018-11-01 DIAGNOSIS — M79601 Pain in right arm: Secondary | ICD-10-CM | POA: Diagnosis not present

## 2018-11-01 DIAGNOSIS — F331 Major depressive disorder, recurrent, moderate: Secondary | ICD-10-CM | POA: Diagnosis not present

## 2018-11-01 DIAGNOSIS — M25531 Pain in right wrist: Secondary | ICD-10-CM | POA: Diagnosis not present

## 2018-11-11 ENCOUNTER — Other Ambulatory Visit: Payer: Self-pay | Admitting: Obstetrics and Gynecology

## 2018-11-11 DIAGNOSIS — Z1231 Encounter for screening mammogram for malignant neoplasm of breast: Secondary | ICD-10-CM

## 2018-11-13 ENCOUNTER — Other Ambulatory Visit: Payer: Self-pay | Admitting: Sports Medicine

## 2018-11-14 ENCOUNTER — Other Ambulatory Visit: Payer: Self-pay | Admitting: *Deleted

## 2018-11-16 ENCOUNTER — Other Ambulatory Visit: Payer: Self-pay

## 2018-11-16 ENCOUNTER — Ambulatory Visit
Admission: RE | Admit: 2018-11-16 | Discharge: 2018-11-16 | Disposition: A | Discharge status: Home / Self Care | Payer: BC Managed Care – PPO | Source: Ambulatory Visit | Attending: Obstetrics and Gynecology | Admitting: Obstetrics and Gynecology

## 2018-11-16 DIAGNOSIS — Z1231 Encounter for screening mammogram for malignant neoplasm of breast: Secondary | ICD-10-CM | POA: Diagnosis not present

## 2018-11-17 DIAGNOSIS — K5090 Crohn's disease, unspecified, without complications: Secondary | ICD-10-CM | POA: Diagnosis not present

## 2018-11-17 DIAGNOSIS — Z932 Ileostomy status: Secondary | ICD-10-CM | POA: Diagnosis not present

## 2018-11-29 DIAGNOSIS — F331 Major depressive disorder, recurrent, moderate: Secondary | ICD-10-CM | POA: Diagnosis not present

## 2018-12-14 DIAGNOSIS — Z8679 Personal history of other diseases of the circulatory system: Secondary | ICD-10-CM

## 2018-12-14 DIAGNOSIS — Z298 Encounter for other specified prophylactic measures: Secondary | ICD-10-CM

## 2018-12-14 DIAGNOSIS — I359 Nonrheumatic aortic valve disorder, unspecified: Secondary | ICD-10-CM

## 2018-12-14 DIAGNOSIS — Z9889 Other specified postprocedural states: Secondary | ICD-10-CM

## 2018-12-15 NOTE — Telephone Encounter (Signed)
Please read

## 2018-12-15 NOTE — Telephone Encounter (Signed)
@LOGO @  Primary Physician/Referring:  Fanny Bien, MD  Patient ID: Brittany Whitney, female    DOB: 06-Jan-1956, 63 y.o.   MRN: [id number]  No chief complaint on file.  HPI:    Brittany Whitney  is a 63 y.o. Caucasian female who presents for a follow-up for Valvular heart disease. Brittany Whitney is a Caucasian female, retired Marine scientist by profession, has bicuspid aortic valve and severe aortic regurgitation, normal coronary arteries by angiography on 10/27/16, mild chronic dyspnea, psoriatic arthritis, Crohn's disease, underwent aortic valve repair and reduction aortoplasty on 08/07/2017 at [facility].  Patient is here on a three-month office visit, she is presently doing well and essentially remains asymptomatic. Due to low blood pressure she is not on ARB or beta blocker. Essentially asymptomatic and palpitations have resolved.  Past Medical History:  Diagnosis Date  . Arthritis   . Crohn's colitis ([place])   . Endometrial polyp   . History of exercise stress test 12-25-2016  by dr Einar Gip   no evidence ischemia, exercise tolerence excellent, no significant arrhythmias  . History of malignant melanoma of skin    2005  post excision back area-- localized  . Hypothyroidism   . Ileostomy in place [facility])    for crohn's colitis -- external pouch  . Migraines   . Severe aortic valve regurgitation cardiologist-  dr Einar Gip--  dx by echo 2017   echo 06/ 10/2016 no change except noted mod. to sev. pulmonry htn/  verified by cardiac cath 07/ 2018 showed no evidence pulmonary htn  . Vitamin D deficiency    Past Surgical History:  Procedure Laterality Date  . ABDOMINAL EXPLORATION SURGERY  x6  1988 (first one) --  1994 (last one)   proctocolectomy w/ ileostomy 1988;  revision 1989, 1991, 1992 internal pouch;  temporary external pouch 1993 then [place]  . BREAST BIOPSY Right 1998   benign  . BUNIONECTOMY Right   . CARPAL TUNNEL RELEASE Right 09/2016  . DILATATION & CURETTAGE/HYSTEROSCOPY  WITH MYOSURE N/A 01/21/2017   Procedure: DILATATION & CURETTAGE/HYSTEROSCOPY WITH MYOSURE;  Surgeon: Arvella Nigh, MD;  Location: [place];  Service: Gynecology;  Laterality: N/A;  . LIPOMA EXCISION  x2   left shouler area  . MELANOMA EXCISION  2005   back area  . RIGHT/LEFT HEART CATH AND CORONARY ANGIOGRAPHY N/A 10/27/2016   Procedure: Right/Left Heart Cath and Coronary Angiography;  Surgeon: Adrian Prows, MD;  Location: [facility];  Service: Cardiovascular;  Laterality: N/A;  normal coronary arteries, normal LVSF and LVEDP, moderate to severe 3+ aortic regurg.,  no evidence pulmonry hypertension, normal cardiac output and index  . THORACIC AORTOGRAM N/A 10/27/2016   Procedure: Thoracic Aortogram;  Surgeon: Adrian Prows, MD;  Location: [facility];  Service: Cardiovascular;  Laterality: N/A;  . TRANSTHORACIC ECHOCARDIOGRAM  10/15/2016   dr Einar Gip   ef 13%, grade 1 diastolic dysfunction/ moderate to severe aortic valve regurg., no evidence of stenosis/  mild to moderate TR/  PASP 15mHg , no evidence pulmonary hypertension  . TUBAL LIGATION Bilateral yrs ago   Social History   Socioeconomic History  . Marital status: Married    Spouse name: Not on file  . Number of children: Not on file  . Years of education: Not on file  . Highest education level: Not on file  Occupational History  . Not on file  Social Needs  . Financial resource strain: Not on file  . Food insecurity  Worry: Not on file    Inability: Not on file  . Transportation needs    Medical: Not on file    Non-medical: Not on file  Tobacco Use  . Smoking status: Never Smoker  . Smokeless tobacco: Never Used  Substance and Sexual Activity  . Alcohol use: Yes    Alcohol/week: 7.0 standard drinks    Types: 7 Glasses of wine per week  . Drug use: Not on file  . Sexual activity: Not on file  Lifestyle  . Physical activity    Days per week: Not on file    Minutes per session: Not on file  .  Stress: Not on file  Relationships  . Social Herbalist on phone: Not on file    Gets together: Not on file    Attends religious service: Not on file    Active member of club or organization: Not on file    Attends meetings of clubs or organizations: Not on file    Relationship status: Not on file  . Intimate partner violence    Fear of current or ex partner: Not on file    Emotionally abused: Not on file    Physically abused: Not on file    Forced sexual activity: Not on file  Other Topics Concern  . Not on file  Social History Narrative  . Not on file   ROS  @REVIEWOFSYSTEMSCV @ Objective  @V @ There is no height or weight on file to calculate BMI.   @PHYSICALEXAMCV @ Radiology: No results found.  Laboratory examination:    No results for input(s): NA, K, CL, CO2, GLUCOSE, BUN, CREATININE, CALCIUM, GFRNONAA, GFRAA in the last 8760 hours. CMP Latest Ref Rng & Units 01/21/2017 10/27/2016 04/23/2010  Glucose 65 - 99 mg/dL 84 90 68(L)  BUN 6 - 20 mg/dL 34(H) 23(H) 21  Creatinine 0.44 - 1.00 mg/dL 0.95 0.90 0.82  Sodium 135 - 145 mmol/L 133(L) 133(L) 138  Potassium 3.5 - 5.1 mmol/L 3.6 3.2(L) 3.5  Chloride 101 - 111 mmol/L 101 100(L) 104  CO2 22 - 32 mmol/L 20(L) 23 27  Calcium 8.9 - 10.3 mg/dL 9.5 9.3 9.6   CBC Latest Ref Rng & Units 01/21/2017 12/10/2011 12/04/2010  WBC 4.0 - 10.5 K/uL 7.2 5.7 7.7  Hemoglobin 12.0 - 15.0 g/dL 12.2 12.3 13.3  Hematocrit 36.0 - 46.0 % 35.0(L) 35.5(L) 39.7  Platelets 150 - 400 K/uL 277 200 238   Lipid Panel  No results found for: CHOL, TRIG, HDL, CHOLHDL, VLDL, LDLCALC, LDLDIRECT HEMOGLOBIN A1C No results found for: HGBA1C, MPG TSH No results for input(s): TSH in the last 8760 hours. Medications   Prior to Admission medications   Medication Sig Start Date End Date Taking? Authorizing Provider  Biotin 5000 MCG TABS Take 1 tablet by mouth daily.    [provider]  Bismuth Subgallate (DEVROM) 200 MG CHEW Chew 1 tablet by  mouth 2 (two) times daily.    [provider]  CALCIUM & MAGNESIUM CARBONATES PO Take 3 tablets by mouth 2 (two) times daily. 500/500    [provider]  Cholecalciferol 5000 units capsule Take 5,000 Units by mouth 2 (two) times daily.    [provider]  DHEA 10 MG CAPS Take 1 capsule by mouth every morning.     [provider]  Diindolylmethane POWD Take 1 capsule by mouth 2 (two) times daily. 100 mg capsule    [provider]  doxycycline (PERIOSTAT) 20 MG tablet  Take 20 mg by mouth 2 (two) times daily.    [provider]  estradiol (VIVELLE-DOT) 0.025 MG/24HR APPLY 1 PATCH TWICE WEEKLY AS DIRECTED. 01/20/16   [provider]  ibuprofen (ADVIL,MOTRIN) 200 MG tablet Take 200 mg by mouth every 6 (six) hours as needed.    [provider]  levothyroxine (SYNTHROID, LEVOTHROID) 100 MCG tablet Take 100 mcg by mouth daily before breakfast.    [provider]  meloxicam (MOBIC) 15 MG tablet Take 1 tablet (15 mg total) by mouth daily. 05/05/18   Stefanie Libel, MD  Multiple Vitamin (MULTIVITAMIN WITH MINERALS) TABS Take 1 tablet by mouth daily.     [provider]  naratriptan (AMERGE) 2.5 MG tablet Take 2.5 mg by mouth as needed. Take one (1) tablet at onset of headache; if returns or does not resolve, may repeat after 4 hours; do not exceed five (5) mg in 24 hours.    [provider]  Omega-3 1000 MG CAPS Take 1 capsule (1,000 mg total) by mouth 2 (two) times daily. 09/12/18   Miquel Dunn, NP  predniSONE (DELTASONE) 5 MG tablet Take 5 mg by mouth every other day. TAKES IN AM    [provider]  PRESCRIPTION MEDICATION Apply 1 application topically daily. Compound cream: testosterone .015% in a 10 ml syringe. Apply 1 ml appilcation to thin skinned area(s)    [provider]  progesterone (PROMETRIUM) 100 MG capsule Take 125 mg by mouth at bedtime. COMPOUND MEDICATION IN PILL FORM     [provider]  sulfaSALAzine (AZULFIDINE) 500 MG tablet Take 1,000 mg by mouth 2 (two) times daily.     [provider]  SUMAtriptan (IMITREX) 50 MG tablet Take 50 mg by mouth every 2 (two) hours as needed.    [provider]  traMADol (ULTRAM) 50 MG tablet TAKE 1 TABLET BY MOUTH TWICE A DAY 11/14/18   Stefanie Libel, MD  Vitamin D, Ergocalciferol, (DRISDOL) 50000 UNITS CAPS Take 50,000 Units by mouth every 14 (fourteen) days.     [provider]  zolpidem (AMBIEN) 10 MG tablet Take 5 mg by mouth at bedtime. for sleep 03/10/16   [provider]     Current Outpatient Medications  Medication Instructions  . Biotin 5000 MCG TABS 1 tablet, Oral, Daily  . Bismuth Subgallate (DEVROM) 200 MG CHEW 1 tablet, Oral, 2 times daily  . CALCIUM & MAGNESIUM CARBONATES PO 3 tablets, Oral, 2 times daily, 500/500  . Cholecalciferol 5,000 Units, Oral, 2 times daily  . DHEA 10 MG CAPS 1 capsule, Oral, BH-each morning  . Diindolylmethane POWD 1 capsule, Oral, 2 times daily, 100 mg capsule  . doxycycline (PERIOSTAT) 20 mg, 2 times daily  . estradiol (VIVELLE-DOT) 0.025 MG/24HR APPLY 1 PATCH TWICE WEEKLY AS DIRECTED.  Marland Kitchen ibuprofen (ADVIL) 200 mg, Oral, Every 6 hours PRN  . levothyroxine (SYNTHROID) 100 mcg, Oral, Daily before breakfast  . meloxicam (MOBIC) 15 mg, Oral, Daily  . Multiple Vitamin (MULTIVITAMIN WITH MINERALS) TABS 1 tablet, Oral, Daily  . naratriptan (AMERGE) 2.5 mg, As needed  . Omega-3 1,000 mg, Oral, 2 times daily  . predniSONE (DELTASONE) 5 mg, Oral, Every other day, TAKES IN AM  . PRESCRIPTION MEDICATION 1 application, Apply externally, Daily, Compound cream: testosterone .015% in a 10 ml syringe. Apply 1 ml appilcation to thin skinned area(s)  . progesterone (PROMETRIUM) 125 mg, Oral, Daily at bedtime, COMPOUND MEDICATION IN PILL FORM  . sulfaSALAzine (AZULFIDINE) 1,000 mg, Oral,  2 times daily  . SUMAtriptan (IMITREX) 50 mg, Every 2 hours PRN  .  traMADol (ULTRAM) 50 MG tablet TAKE 1 TABLET BY MOUTH TWICE A DAY  . Vitamin D (Ergocalciferol) (DRISDOL) 50,000 Units, Oral, Every 14 days  . zolpidem (AMBIEN) 5 mg, Oral, Daily at bedtime, for sleep    Cardiac Studies:   Echocardiogram  [facility]: 09/29/2017: Normal LV size and function. Global LV myocardial strain is borderline abnormal. Right ventricle is normal. No significant valvular abnormality. Mild aortic regurgitation. Bicuspid aortic valve S/P aortic valve repair and S/P aortic valve commissioner me. Peak gradient 18 and mean gradient 9 mmHg. Dimensionless valve index was 0.51 with valve area of 1.97 cm. Aortic sinus measured 3.7 cm, mid arch measured 2.8 cm.   Assessment     ICD-10-CM   1. Aortic valve disorder  I35.9   2. S/P ascending aortic aneurysm repair  Z98.890    Z86.79   3. SBE (subacute bacterial endocarditis) prophylaxis candidate  Z29.8     EKG 09/15/2017: Number sinus rhythm at rate of 78 bpm, left atrial enlargement, incomplete right bundle branch block.  Otherwise normal EKG.  Recommendations:   I will order echo to f/u on AV disorder as she has not been able to gt back to [facility] (requested by patient as well on portal). OV soon.   Adrian Prows, MD, [facility] 12/15/2018, 11:02 PM [facility]. [place] Pager: [phone] Office: [phone] If no answer Cell [phone]

## 2018-12-21 DIAGNOSIS — F331 Major depressive disorder, recurrent, moderate: Secondary | ICD-10-CM | POA: Diagnosis not present

## 2018-12-28 ENCOUNTER — Ambulatory Visit: Payer: Self-pay | Admitting: Cardiology

## 2018-12-30 DIAGNOSIS — K5090 Crohn's disease, unspecified, without complications: Secondary | ICD-10-CM | POA: Diagnosis not present

## 2018-12-30 DIAGNOSIS — Z1159 Encounter for screening for other viral diseases: Secondary | ICD-10-CM | POA: Diagnosis not present

## 2018-12-30 DIAGNOSIS — E039 Hypothyroidism, unspecified: Secondary | ICD-10-CM | POA: Diagnosis not present

## 2018-12-30 DIAGNOSIS — Z0000 Encounter for general adult medical examination without abnormal findings: Secondary | ICD-10-CM | POA: Diagnosis not present

## 2018-12-30 DIAGNOSIS — M1990 Unspecified osteoarthritis, unspecified site: Secondary | ICD-10-CM | POA: Diagnosis not present

## 2018-12-30 DIAGNOSIS — G4700 Insomnia, unspecified: Secondary | ICD-10-CM | POA: Diagnosis not present

## 2019-01-03 DIAGNOSIS — G43819 Other migraine, intractable, without status migrainosus: Secondary | ICD-10-CM | POA: Diagnosis not present

## 2019-01-03 DIAGNOSIS — H01001 Unspecified blepharitis right upper eyelid: Secondary | ICD-10-CM | POA: Diagnosis not present

## 2019-01-03 DIAGNOSIS — L719 Rosacea, unspecified: Secondary | ICD-10-CM | POA: Diagnosis not present

## 2019-01-03 DIAGNOSIS — Z961 Presence of intraocular lens: Secondary | ICD-10-CM | POA: Diagnosis not present

## 2019-01-04 DIAGNOSIS — Z681 Body mass index (BMI) 19 or less, adult: Secondary | ICD-10-CM | POA: Diagnosis not present

## 2019-01-04 DIAGNOSIS — Z0000 Encounter for general adult medical examination without abnormal findings: Secondary | ICD-10-CM | POA: Diagnosis not present

## 2019-01-04 DIAGNOSIS — Z1211 Encounter for screening for malignant neoplasm of colon: Secondary | ICD-10-CM | POA: Diagnosis not present

## 2019-01-04 DIAGNOSIS — H6121 Impacted cerumen, right ear: Secondary | ICD-10-CM | POA: Diagnosis not present

## 2019-01-04 DIAGNOSIS — Z23 Encounter for immunization: Secondary | ICD-10-CM | POA: Diagnosis not present

## 2019-01-09 ENCOUNTER — Ambulatory Visit: Payer: Self-pay | Admitting: Cardiology

## 2019-01-09 NOTE — Progress Notes (Deleted)
@LOGO @  Primary Physician/Referring:  Fanny Bien, MD  Patient ID: Brittany Whitney, female    DOB: 1955/11/19, 63 y.o.   MRN: [id number]  No chief complaint on file.  HPI:    Brittany Whitney  is a 63 y.o. Caucasian female who presents for a follow-up for Valvular heart disease. Brittany Whitney is a Caucasian female, retired Marine scientist by profession, has bicuspid aortic valve and severe aortic regurgitation, normal coronary arteries by angiography on 10/27/16, mild chronic dyspnea, psoriatic arthritis, Crohn's disease, underwent aortic valve repair and reduction aortoplasty on 08/07/2017 at [facility].  Patient is here on a three-month office visit, she is presently doing well and essentially remains asymptomatic. Due to low blood pressure she is not on ARB or beta blocker. Essentially asymptomatic and palpitations have resolved.  Past Medical History:  Diagnosis Date  . Arthritis   . Crohn's colitis ([place])   . Endometrial polyp   . History of exercise stress test 12-25-2016  by dr Einar Gip   no evidence ischemia, exercise tolerence excellent, no significant arrhythmias  . History of malignant melanoma of skin    2005  post excision back area-- localized  . Hypothyroidism   . Ileostomy in place [facility])    for crohn's colitis -- external pouch  . Migraines   . Severe aortic valve regurgitation cardiologist-  dr Einar Gip--  dx by echo 2017   echo 06/ 10/2016 no change except noted mod. to sev. pulmonry htn/  verified by cardiac cath 07/ 2018 showed no evidence pulmonary htn  . Vitamin D deficiency    Past Surgical History:  Procedure Laterality Date  . ABDOMINAL EXPLORATION SURGERY  x6  1988 (first one) --  1994 (last one)   proctocolectomy w/ ileostomy 1988;  revision 1989, 1991, 1992 internal pouch;  temporary external pouch 1993 then [place]  . BREAST BIOPSY Right 1998   benign  . BUNIONECTOMY Right   . CARPAL TUNNEL RELEASE Right 09/2016  . DILATATION & CURETTAGE/HYSTEROSCOPY  WITH MYOSURE N/A 01/21/2017   Procedure: DILATATION & CURETTAGE/HYSTEROSCOPY WITH MYOSURE;  Surgeon: Arvella Nigh, MD;  Location: [place];  Service: Gynecology;  Laterality: N/A;  . LIPOMA EXCISION  x2   left shouler area  . MELANOMA EXCISION  2005   back area  . RIGHT/LEFT HEART CATH AND CORONARY ANGIOGRAPHY N/A 10/27/2016   Procedure: Right/Left Heart Cath and Coronary Angiography;  Surgeon: Adrian Prows, MD;  Location: [place] [facility];  Service: Cardiovascular;  Laterality: N/A;  normal coronary arteries, normal LVSF and LVEDP, moderate to severe 3+ aortic regurg.,  no evidence pulmonry hypertension, normal cardiac output and index  . THORACIC AORTOGRAM N/A 10/27/2016   Procedure: Thoracic Aortogram;  Surgeon: Adrian Prows, MD;  Location: [place] [facility];  Service: Cardiovascular;  Laterality: N/A;  . TRANSTHORACIC ECHOCARDIOGRAM  10/15/2016   dr Einar Gip   ef 16%, grade 1 diastolic dysfunction/ moderate to severe aortic valve regurg., no evidence of stenosis/  mild to moderate TR/  PASP 29mHg , no evidence pulmonary hypertension  . TUBAL LIGATION Bilateral yrs ago   Social History   Socioeconomic History  . Marital status: Married    Spouse name: Not on file  . Number of children: Not on file  . Years of education: Not on file  . Highest education level: Not on file  Occupational History  . Not on file  Social Needs  . Financial resource strain: Not on file  . Food insecurity  Worry: Not on file    Inability: Not on file  . Transportation needs    Medical: Not on file    Non-medical: Not on file  Tobacco Use  . Smoking status: Never Smoker  . Smokeless tobacco: Never Used  Substance and Sexual Activity  . Alcohol use: Yes    Alcohol/week: 7.0 standard drinks    Types: 7 Glasses of wine per week  . Drug use: Not on file  . Sexual activity: Not on file  Lifestyle  . Physical activity    Days per week: Not on file    Minutes per session: Not on file  .  Stress: Not on file  Relationships  . Social Herbalist on phone: Not on file    Gets together: Not on file    Attends religious service: Not on file    Active member of club or organization: Not on file    Attends meetings of clubs or organizations: Not on file    Relationship status: Not on file  . Intimate partner violence    Fear of current or ex partner: Not on file    Emotionally abused: Not on file    Physically abused: Not on file    Forced sexual activity: Not on file  Other Topics Concern  . Not on file  Social History Narrative  . Not on file   ROS  @REVIEWOFSYSTEMSCV @ Objective  @V @ There is no height or weight on file to calculate BMI.   @PHYSICALEXAMCV @ Radiology: No results found.  Laboratory examination:    No results for input(s): NA, K, CL, CO2, GLUCOSE, BUN, CREATININE, CALCIUM, GFRNONAA, GFRAA in the last 8760 hours. CMP Latest Ref Rng & Units 01/21/2017 10/27/2016 04/23/2010  Glucose 65 - 99 mg/dL 84 90 68(L)  BUN 6 - 20 mg/dL 34(H) 23(H) 21  Creatinine 0.44 - 1.00 mg/dL 0.95 0.90 0.82  Sodium 135 - 145 mmol/L 133(L) 133(L) 138  Potassium 3.5 - 5.1 mmol/L 3.6 3.2(L) 3.5  Chloride 101 - 111 mmol/L 101 100(L) 104  CO2 22 - 32 mmol/L 20(L) 23 27  Calcium 8.9 - 10.3 mg/dL 9.5 9.3 9.6   CBC Latest Ref Rng & Units 01/21/2017 12/10/2011 12/04/2010  WBC 4.0 - 10.5 K/uL 7.2 5.7 7.7  Hemoglobin 12.0 - 15.0 g/dL 12.2 12.3 13.3  Hematocrit 36.0 - 46.0 % 35.0(L) 35.5(L) 39.7  Platelets 150 - 400 K/uL 277 200 238   Lipid Panel  No results found for: CHOL, TRIG, HDL, CHOLHDL, VLDL, LDLCALC, LDLDIRECT HEMOGLOBIN A1C No results found for: HGBA1C, MPG TSH No results for input(s): TSH in the last 8760 hours. Medications   Prior to Admission medications   Medication Sig Start Date End Date Taking? Authorizing Provider  Biotin 5000 MCG TABS Take 1 tablet by mouth daily.    [provider]  Bismuth Subgallate (DEVROM) 200 MG CHEW Chew 1 tablet by  mouth 2 (two) times daily.    [provider]  CALCIUM & MAGNESIUM CARBONATES PO Take 3 tablets by mouth 2 (two) times daily. 500/500    [provider]  Cholecalciferol 5000 units capsule Take 5,000 Units by mouth 2 (two) times daily.    [provider]  DHEA 10 MG CAPS Take 1 capsule by mouth every morning.     [provider]  Diindolylmethane POWD Take 1 capsule by mouth 2 (two) times daily. 100 mg capsule    [provider]  doxycycline (PERIOSTAT) 20 MG tablet  Take 20 mg by mouth 2 (two) times daily.    [provider]  estradiol (VIVELLE-DOT) 0.025 MG/24HR APPLY 1 PATCH TWICE WEEKLY AS DIRECTED. 01/20/16   [provider]  ibuprofen (ADVIL,MOTRIN) 200 MG tablet Take 200 mg by mouth every 6 (six) hours as needed.    [provider]  levothyroxine (SYNTHROID, LEVOTHROID) 100 MCG tablet Take 100 mcg by mouth daily before breakfast.    [provider]  meloxicam (MOBIC) 15 MG tablet Take 1 tablet (15 mg total) by mouth daily. 05/05/18   Stefanie Libel, MD  Multiple Vitamin (MULTIVITAMIN WITH MINERALS) TABS Take 1 tablet by mouth daily.     [provider]  naratriptan (AMERGE) 2.5 MG tablet Take 2.5 mg by mouth as needed. Take one (1) tablet at onset of headache; if returns or does not resolve, may repeat after 4 hours; do not exceed five (5) mg in 24 hours.    [provider]  Omega-3 1000 MG CAPS Take 1 capsule (1,000 mg total) by mouth 2 (two) times daily. 09/12/18   Miquel Dunn, NP  predniSONE (DELTASONE) 5 MG tablet Take 5 mg by mouth every other day. TAKES IN AM    [provider]  PRESCRIPTION MEDICATION Apply 1 application topically daily. Compound cream: testosterone .015% in a 10 ml syringe. Apply 1 ml appilcation to thin skinned area(s)    [provider]  progesterone (PROMETRIUM) 100 MG capsule Take 125 mg by mouth at bedtime. COMPOUND MEDICATION IN PILL FORM     [provider]  sulfaSALAzine (AZULFIDINE) 500 MG tablet Take 1,000 mg by mouth 2 (two) times daily.     [provider]  SUMAtriptan (IMITREX) 50 MG tablet Take 50 mg by mouth every 2 (two) hours as needed.    [provider]  traMADol (ULTRAM) 50 MG tablet TAKE 1 TABLET BY MOUTH TWICE A DAY 11/14/18   Stefanie Libel, MD  Vitamin D, Ergocalciferol, (DRISDOL) 50000 UNITS CAPS Take 50,000 Units by mouth every 14 (fourteen) days.     [provider]  zolpidem (AMBIEN) 10 MG tablet Take 5 mg by mouth at bedtime. for sleep 03/10/16   [provider]     Current Outpatient Medications  Medication Instructions  . Biotin 5000 MCG TABS 1 tablet, Oral, Daily  . Bismuth Subgallate (DEVROM) 200 MG CHEW 1 tablet, Oral, 2 times daily  . CALCIUM & MAGNESIUM CARBONATES PO 3 tablets, Oral, 2 times daily, 500/500  . Cholecalciferol 5,000 Units, Oral, 2 times daily  . DHEA 10 MG CAPS 1 capsule, Oral, BH-each morning  . Diindolylmethane POWD 1 capsule, Oral, 2 times daily, 100 mg capsule  . doxycycline (PERIOSTAT) 20 mg, 2 times daily  . estradiol (VIVELLE-DOT) 0.025 MG/24HR APPLY 1 PATCH TWICE WEEKLY AS DIRECTED.  Marland Kitchen ibuprofen (ADVIL) 200 mg, Oral, Every 6 hours PRN  . levothyroxine (SYNTHROID) 100 mcg, Oral, Daily before breakfast  . meloxicam (MOBIC) 15 mg, Oral, Daily  . Multiple Vitamin (MULTIVITAMIN WITH MINERALS) TABS 1 tablet, Oral, Daily  . naratriptan (AMERGE) 2.5 mg, As needed  . Omega-3 1,000 mg, Oral, 2 times daily  . predniSONE (DELTASONE) 5 mg, Oral, Every other day, TAKES IN AM  . PRESCRIPTION MEDICATION 1 application, Apply externally, Daily, Compound cream: testosterone .015% in a 10 ml syringe. Apply 1 ml appilcation to thin skinned area(s)  . progesterone (PROMETRIUM) 125 mg, Oral, Daily at bedtime, COMPOUND MEDICATION IN PILL FORM  . sulfaSALAzine (AZULFIDINE) 1,000 mg, Oral,  2 times daily  . SUMAtriptan (IMITREX) 50 mg, Every 2 hours PRN  .  traMADol (ULTRAM) 50 MG tablet TAKE 1 TABLET BY MOUTH TWICE A DAY  . Vitamin D (Ergocalciferol) (DRISDOL) 50,000 Units, Oral, Every 14 days  . zolpidem (AMBIEN) 5 mg, Oral, Daily at bedtime, for sleep    Cardiac Studies:   Echocardiogram  [place] [facility]: 09/29/2017: Normal LV size and function. Global LV myocardial strain is borderline abnormal. Right ventricle is normal. No significant valvular abnormality. Mild aortic regurgitation. Bicuspid aortic valve S/P aortic valve repair and S/P aortic valve commissioner me. Peak gradient 18 and mean gradient 9 mmHg. Dimensionless valve index was 0.51 with valve area of 1.97 cm. Aortic sinus measured 3.7 cm, mid arch measured 2.8 cm.   Assessment     ICD-10-CM   1. Aortic valve disorder  I35.9   2. H/O aortic valve repair  Z98.890    Z86.79   3. S/P ascending aortic aneurysm repair  Z98.890    Z86.79   4. SBE (subacute bacterial endocarditis) prophylaxis candidate  Z29.8   5. Mixed hyperlipidemia  E78.2     EKG 09/15/2017: Number sinus rhythm at rate of 78 bpm, left atrial enlargement, incomplete right bundle branch block.  Otherwise normal EKG.  Recommendations:   I will order echo to f/u on AV disorder as she has not been able to gt back to [facility] (requested by patient as well on portal). OV soon.   Adrian Prows, MD, [facility] 01/09/2019, 2:13 PM [facility] [place] Pager: [phone] Office: [phone] If no answer Cell [phone]

## 2019-01-12 ENCOUNTER — Ambulatory Visit (INDEPENDENT_AMBULATORY_CARE_PROVIDER_SITE_OTHER): Payer: BC Managed Care – PPO

## 2019-01-12 ENCOUNTER — Other Ambulatory Visit: Payer: Self-pay

## 2019-01-12 ENCOUNTER — Other Ambulatory Visit: Payer: Self-pay | Admitting: Sports Medicine

## 2019-01-12 DIAGNOSIS — Z9889 Other specified postprocedural states: Secondary | ICD-10-CM | POA: Diagnosis not present

## 2019-01-12 DIAGNOSIS — Z8679 Personal history of other diseases of the circulatory system: Secondary | ICD-10-CM

## 2019-01-12 DIAGNOSIS — I359 Nonrheumatic aortic valve disorder, unspecified: Secondary | ICD-10-CM

## 2019-02-12 ENCOUNTER — Other Ambulatory Visit: Payer: Self-pay | Admitting: Sports Medicine

## 2019-02-14 ENCOUNTER — Other Ambulatory Visit: Payer: Self-pay | Admitting: Sports Medicine

## 2019-02-14 DIAGNOSIS — Z85828 Personal history of other malignant neoplasm of skin: Secondary | ICD-10-CM | POA: Diagnosis not present

## 2019-02-14 DIAGNOSIS — L820 Inflamed seborrheic keratosis: Secondary | ICD-10-CM | POA: Diagnosis not present

## 2019-02-14 DIAGNOSIS — Z85820 Personal history of malignant melanoma of skin: Secondary | ICD-10-CM | POA: Diagnosis not present

## 2019-02-14 DIAGNOSIS — D1801 Hemangioma of skin and subcutaneous tissue: Secondary | ICD-10-CM | POA: Diagnosis not present

## 2019-02-14 MED ORDER — TRAMADOL HCL 50 MG PO TABS: 50.0000 mg | ORAL_TABLET | Freq: Two times a day (BID) | ORAL | 1 refills | Status: DC

## 2019-02-16 ENCOUNTER — Other Ambulatory Visit: Payer: Self-pay

## 2019-02-16 ENCOUNTER — Ambulatory Visit (INDEPENDENT_AMBULATORY_CARE_PROVIDER_SITE_OTHER): Payer: BC Managed Care – PPO | Admitting: Sports Medicine

## 2019-02-16 ENCOUNTER — Encounter: Payer: Self-pay | Admitting: Sports Medicine

## 2019-02-16 ENCOUNTER — Ambulatory Visit: Payer: Self-pay | Admitting: Cardiology

## 2019-02-16 ENCOUNTER — Ambulatory Visit: Payer: Self-pay

## 2019-02-16 VITALS — BP 118/60 | Ht 67.0 in | Wt 125.0 lb

## 2019-02-16 DIAGNOSIS — M67431 Ganglion, right wrist: Secondary | ICD-10-CM | POA: Diagnosis not present

## 2019-02-16 DIAGNOSIS — M25562 Pain in left knee: Secondary | ICD-10-CM

## 2019-02-16 NOTE — Assessment & Plan Note (Signed)
She put off surgery and now after 8 months the wrist feels almost back to normal.  Minimal pain and better strength.  Repeat US for interest showed: Ganglion is 80% smaller Some calcfication and spurring at distal ulna Small tear in TFCC ECU intact with some mild swelling noted around tendon  Impression:  Definite improvement in findings.  Ultrasound and interpretation by Wolfgang Phoenix. Oneida Alar, MD

## 2019-02-16 NOTE — Assessment & Plan Note (Addendum)
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 She has peri-meniscal fluid and baker's cyst noted on ultrasound as well. Suspect that she a small cartilage tear I knee due to hypermobility which is now loose body. No sign of arthritis on ultrasound or exam.  - instructed her to use compression sleeve - provided body helix knee sleeve - start exercise with stationary bike - provided hip abduction and isometric quad exercises  Return in 4 weeks

## 2019-02-16 NOTE — Progress Notes (Signed)
  Brittany Whitney - 63 y.o. female MRN [id number]  Date of birth: 24-May-1955  SUBJECTIVE:   CC: left knee pain    63 yo female presenting with left knee pain and swelling for the past week. She does not recall a specific injury but noted a week ago when she squatted down to drain her washing machine that her left knee would not bend as easily as her right. Since that time, she noted swelling in her left knee superior to the knee cap. She has had intermittent pain as well that she thinks is from the swelling. No locking, feeling of instability. She has had a baker's cyst in her left knee in her 88s but otherwise no knee issues.  She has Chrohn's diease, currently under control with no recent flare.  ROS: No unexpected weight loss, fever, chills, swelling, instability, muscle pain, numbness/tingling, redness, otherwise see HPI   PMHx - Updated and reviewed.  Contributory factors include: Negative PSHx - Updated and reviewed.  Contributory factors include:  Negative FHx - Updated and reviewed.  Contributory factors include:  Negative Social Hx - Updated and reviewed. Contributory factors include: Negative Medications - reviewed   DATA REVIEWED: Prior recors  PHYSICAL EXAM:  VS: BP:118/60  HR: bpm  TEMP: ( )  RESP:   HT:5' 7"  (170.2 cm)   WT:125 lb (56.7 kg)  BMI:19.57 PHYSICAL EXAM: Gen: NAD, alert, cooperative with exam, well-appearing HEENT: clear conjunctiva,  CV:  no edema, capillary refill brisk, normal rate Resp: non-labored Skin: no rashes, normal turgor  Neuro: no gross deficits.  Psych:  alert and oriented  Left Knee: - Inspection: Effusion noted superior to patella. Loss of superior patellar landmarks when at 90 degrees - Palpation: no TTP - ROM: good active ROM extension in knee and hip; flexion is still good but limited to 50 degrees compared to 30 degrees on right - Strength: 5/5 strength - Neuro/vasc: NV intact - Special Tests: - LIGAMENTS: negative anterior and  posterior drawer, no MCL or LCL laxity  -- MENISCUS: negative McMurray's -- PF JOINT: nml patellar mobility bilaterally  Right knee: No abnormality No TTP Full ROM Good strength  MSK ultrasound Left knee:  Images were obtained both in the transverse and longitudinal plane. Patellar and quadriceps tendons were well visualized with no abnormalities .ModeratemEffusion noted in suprapatellar pouch. In lateral area of effusion, loose body noted. Medial and lateral menisci were well visualized, well appearing but both areas had hypoechoic fluid superior to menisci. Popliteal cyst was evaluated in both the transverse and longitudinal planes. Septated Baker's cyst present.  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  Ultrasound and interpretation by Wolfgang Phoenix. 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 She has peri-meniscal fluid and baker's cyst noted on ultrasound as well. Suspect that she a small cartilage tear I knee due to hypermobility which is now loose body. No sign of arthritis on ultrasound or exam.  - instructed her to use compression sleeve - provided body helix knee sleeve - start exercise with stationary bike - provided hip abduction and isometric quad exercises  Return in 4 weeks   I observed and examined the patient with the Dr. Mayer Masker and agree with assessment and plan.  Note reviewed and modified by me. Ila Mcgill, MD

## 2019-03-07 ENCOUNTER — Ambulatory Visit (INDEPENDENT_AMBULATORY_CARE_PROVIDER_SITE_OTHER): Payer: BC Managed Care – PPO | Admitting: Sports Medicine

## 2019-03-07 ENCOUNTER — Other Ambulatory Visit: Payer: Self-pay

## 2019-03-07 ENCOUNTER — Encounter: Payer: Self-pay | Admitting: Sports Medicine

## 2019-03-07 VITALS — BP 112/68 | Ht 67.0 in | Wt 125.0 lb

## 2019-03-07 DIAGNOSIS — M25562 Pain in left knee: Secondary | ICD-10-CM

## 2019-03-07 MED ORDER — METHYLPREDNISOLONE ACETATE 40 MG/ML IJ SUSP
40.0000 mg | Freq: Once | INTRAMUSCULAR | Status: AC
  Administered 2019-03-07: 40 mg via INTRA_ARTICULAR

## 2019-03-07 NOTE — Progress Notes (Signed)
[facility] [address] [place], [place] [address] Phone: [phone] Fax: [phone]   Patient Name: Brittany Whitney Date of Birth: 01-07-1956 Medical Record Number: [id number] Gender: female Date of Encounter: 03/07/2019  CC: Left knee pain  HPI: Brittany Whitney is a 63 year old F following up for left knee pain. She was last seen 2.5 weeks ago at which time we recommended conservative treatment with a compression sleeve and stationary bike.  Unfortunately she did not notice much improvement in her knee.  She feels as though her kneecap is not tracking well.  She is having pain with deep squats.  She denies any radiating symptoms to her feet. She is still complaining of swelling in the front and back of her knee.  Patient does have to take p.o. prednisone every other day for chronic gastrointestinal disease.  Past Medical History:  Diagnosis Date  . Arthritis   . Crohn's colitis ([place])   . Endometrial polyp   . History of exercise stress test 12-25-2016  by dr Einar Gip   no evidence ischemia, exercise tolerence excellent, no significant arrhythmias  . History of malignant melanoma of skin    2005  post excision back area-- localized  . Hypothyroidism   . Ileostomy in place [facility])    for crohn's colitis -- external pouch  . Migraines   . Severe aortic valve regurgitation cardiologist-  dr Einar Gip--  dx by echo 2017   echo 06/ 10/2016 no change except noted mod. to sev. pulmonry htn/  verified by cardiac cath 07/ 2018 showed no evidence pulmonary htn  . Vitamin D deficiency     Current Outpatient Medications on File Prior to Visit  Medication Sig Dispense Refill  . Biotin 5000 MCG TABS Take 1 tablet by mouth daily.    . Bismuth Subgallate (DEVROM) 200 MG CHEW Chew 1 tablet by mouth 2 (two) times daily.    Marland Kitchen CALCIUM & MAGNESIUM CARBONATES PO Take 3 tablets by mouth 2 (two) times daily. 500/500    . Cholecalciferol 5000 units capsule Take 5,000 Units by mouth  2 (two) times daily.    Marland Kitchen DHEA 10 MG CAPS Take 1 capsule by mouth every morning.     . Diindolylmethane POWD Take 1 capsule by mouth 2 (two) times daily. 100 mg capsule    . doxycycline (PERIOSTAT) 20 MG tablet Take 20 mg by mouth 2 (two) times daily.    Marland Kitchen estradiol (VIVELLE-DOT) 0.025 MG/24HR APPLY 1 PATCH TWICE WEEKLY AS DIRECTED.  1  . ibuprofen (ADVIL,MOTRIN) 200 MG tablet Take 200 mg by mouth every 6 (six) hours as needed.    Marland Kitchen levothyroxine (SYNTHROID, LEVOTHROID) 100 MCG tablet Take 100 mcg by mouth daily before breakfast.    . meloxicam (MOBIC) 15 MG tablet TAKE 1 TABLET BY MOUTH EVERY DAY 90 tablet 2  . Multiple Vitamin (MULTIVITAMIN WITH MINERALS) TABS Take 1 tablet by mouth daily.     . naratriptan (AMERGE) 2.5 MG tablet Take 2.5 mg by mouth as needed. Take one (1) tablet at onset of headache; if returns or does not resolve, may repeat after 4 hours; do not exceed five (5) mg in 24 hours.    . Omega-3 1000 MG CAPS Take 1 capsule (1,000 mg total) by mouth 2 (two) times daily. 180 capsule 3  . predniSONE (DELTASONE) 5 MG tablet Take 5 mg by mouth every other day. TAKES IN AM    . PRESCRIPTION MEDICATION Apply 1 application topically daily. Compound cream: testosterone .  015% in a 10 ml syringe. Apply 1 ml appilcation to thin skinned area(s)    . progesterone (PROMETRIUM) 100 MG capsule Take 125 mg by mouth at bedtime. COMPOUND MEDICATION IN PILL FORM    . sulfaSALAzine (AZULFIDINE) 500 MG tablet Take 1,000 mg by mouth 2 (two) times daily.     . SUMAtriptan (IMITREX) 50 MG tablet Take 50 mg by mouth every 2 (two) hours as needed.    . traMADol (ULTRAM) 50 MG tablet Take 1 tablet (50 mg total) by mouth 2 (two) times daily. 60 tablet 1  . Vitamin D, Ergocalciferol, (DRISDOL) 50000 UNITS CAPS Take 50,000 Units by mouth every 14 (fourteen) days.     Marland Kitchen zolpidem (AMBIEN) 10 MG tablet Take 5 mg by mouth at bedtime. for sleep  3   No current facility-administered medications on file prior to  visit.     Past Surgical History:  Procedure Laterality Date  . ABDOMINAL EXPLORATION SURGERY  x6  1988 (first one) --  1994 (last one)   proctocolectomy w/ ileostomy 1988;  revision 1989, 1991, 1992 internal pouch;  temporary external pouch 1993 then [place]  . BREAST BIOPSY Right 1998   benign  . BUNIONECTOMY Right   . CARPAL TUNNEL RELEASE Right 09/2016  . DILATATION & CURETTAGE/HYSTEROSCOPY WITH MYOSURE N/A 01/21/2017   Procedure: DILATATION & CURETTAGE/HYSTEROSCOPY WITH MYOSURE;  Surgeon: Arvella Nigh, MD;  Location: [facility];  Service: Gynecology;  Laterality: N/A;  . LIPOMA EXCISION  x2   left shouler area  . MELANOMA EXCISION  2005   back area  . RIGHT/LEFT HEART CATH AND CORONARY ANGIOGRAPHY N/A 10/27/2016   Procedure: Right/Left Heart Cath and Coronary Angiography;  Surgeon: Adrian Prows, MD;  Location: [facility];  Service: Cardiovascular;  Laterality: N/A;  normal coronary arteries, normal LVSF and LVEDP, moderate to severe 3+ aortic regurg.,  no evidence pulmonry hypertension, normal cardiac output and index  . THORACIC AORTOGRAM N/A 10/27/2016   Procedure: Thoracic Aortogram;  Surgeon: Adrian Prows, MD;  Location: [facility];  Service: Cardiovascular;  Laterality: N/A;  . TRANSTHORACIC ECHOCARDIOGRAM  10/15/2016   dr Einar Gip   ef 93%, grade 1 diastolic dysfunction/ moderate to severe aortic valve regurg., no evidence of stenosis/  mild to moderate TR/  PASP 68mHg , no evidence pulmonary hypertension  . TUBAL LIGATION Bilateral yrs ago    Allergies  Allergen Reactions  . Clindamycin/Lincomycin     Please avoid - questionable allergy - possible rash?  . Flagyl [Metronidazole] Other (See Comments)    Numbness in legs and arms  . Penicillins Hives  . Ceclor [Cefaclor] Rash    Social History   Socioeconomic History  . Marital status: Married    Spouse name: Not on file  . Number of children: Not on file  . Years of education: Not on  file  . Highest education level: Not on file  Occupational History  . Not on file  Social Needs  . Financial resource strain: Not on file  . Food insecurity    Worry: Not on file    Inability: Not on file  . Transportation needs    Medical: Not on file    Non-medical: Not on file  Tobacco Use  . Smoking status: Never Smoker  . Smokeless tobacco: Never Used  Substance and Sexual Activity  . Alcohol use: Yes    Alcohol/week: 7.0 standard drinks    Types: 7 Glasses of wine per week  .  Drug use: Not on file  . Sexual activity: Not on file  Lifestyle  . Physical activity    Days per week: Not on file    Minutes per session: Not on file  . Stress: Not on file  Relationships  . Social Herbalist on phone: Not on file    Gets together: Not on file    Attends religious service: Not on file    Active member of club or organization: Not on file    Attends meetings of clubs or organizations: Not on file    Relationship status: Not on file  . Intimate partner violence    Fear of current or ex partner: Not on file    Emotionally abused: Not on file    Physically abused: Not on file    Forced sexual activity: Not on file  Other Topics Concern  . Not on file  Social History Narrative  . Not on file    Family History  Problem Relation Age of Onset  . Breast cancer Mother 92  . Breast cancer Maternal Aunt        after 50  . Breast cancer Paternal Aunt        after 50    BP 112/68   Ht 5' 7"  (1.702 m)   Wt 125 lb (56.7 kg)   BMI 19.58 kg/m   ROS:  See HPI CONST: no F/C, no malaise, no fatigue MSK: See above NEURO: no numbness/tingling, no weakness SKIN: no rash, no lesions HEME: no bleeding, no bruising, no erythema  Objective: GEN: Alert and oriented, NAD Pulm: Breathing unlabored PSY: normal mood, congruent affect  Left knee: Suprapatellar and prepatellar effusion Palpation normal with no warmth, joint line tenderness, patellar tenderness, or  condyle tenderness. Decreased ROM in knee flexion Ligaments with solid consistent endpoints including ACL, PCL, LCL, MCL. Positive Thessaly test Non painful patellar compression. Patellar glide without crepitus. Patellar and quadriceps tendons unremarkable. Hamstring and quadriceps strength is normal.  Neurovascularly intact.  Note [place] prior to injection documented large effusion  Procedure: After informed written consent timeout was performed, patient was seated on exam table. Left knee was prepped with alcohol swab and under US guidance 30 cc of blood-tinged fluid was aspirated from the suprapatellar pouch with an 18-gauge needle.  After aspiration, patient's left knee was injected intraarticularly with 4:1 bupivicaine: depomedrol. Patient tolerated the procedure well without immediate complications.  Patient felt immediate improvement after her procedure.   Assessment and Plan:  1.  Left knee pain and swelling  Successful aspiration and injection as above.  She does have a loose body that is likely contributing to her pain.  We are hopeful with today's procedure and continuing her exercise program that she will have less swelling and more relief.  Continue to use body helix sleeve and anti-inflammatory as needed.  Lanier Clam, DO, ATC Sports Medicine Fellow  I observed and examined the patient with the Dr. Kathrynn Speed and agree with assessment and plan.  Note reviewed and modified by me. We will get other diagnostic testing - possibly MRI if she does not steadily improve. Ila Mcgill, MD

## 2019-03-20 DIAGNOSIS — Z932 Ileostomy status: Secondary | ICD-10-CM | POA: Diagnosis not present

## 2019-03-20 DIAGNOSIS — K5090 Crohn's disease, unspecified, without complications: Secondary | ICD-10-CM | POA: Diagnosis not present

## 2019-03-21 DIAGNOSIS — E559 Vitamin D deficiency, unspecified: Secondary | ICD-10-CM | POA: Diagnosis not present

## 2019-03-21 DIAGNOSIS — E782 Mixed hyperlipidemia: Secondary | ICD-10-CM | POA: Diagnosis not present

## 2019-03-21 DIAGNOSIS — E039 Hypothyroidism, unspecified: Secondary | ICD-10-CM | POA: Diagnosis not present

## 2019-03-21 DIAGNOSIS — Z03818 Encounter for observation for suspected exposure to other biological agents ruled out: Secondary | ICD-10-CM | POA: Diagnosis not present

## 2019-03-23 DIAGNOSIS — M1990 Unspecified osteoarthritis, unspecified site: Secondary | ICD-10-CM | POA: Diagnosis not present

## 2019-03-23 DIAGNOSIS — E559 Vitamin D deficiency, unspecified: Secondary | ICD-10-CM | POA: Diagnosis not present

## 2019-03-23 DIAGNOSIS — E782 Mixed hyperlipidemia: Secondary | ICD-10-CM | POA: Diagnosis not present

## 2019-04-18 ENCOUNTER — Ambulatory Visit: Payer: Self-pay

## 2019-04-18 ENCOUNTER — Other Ambulatory Visit: Payer: Self-pay

## 2019-04-18 ENCOUNTER — Ambulatory Visit (INDEPENDENT_AMBULATORY_CARE_PROVIDER_SITE_OTHER): Payer: BC Managed Care – PPO | Admitting: Sports Medicine

## 2019-04-18 VITALS — BP 120/62 | Ht 67.0 in | Wt 125.0 lb

## 2019-04-18 DIAGNOSIS — M25531 Pain in right wrist: Secondary | ICD-10-CM | POA: Insufficient documentation

## 2019-04-18 DIAGNOSIS — M25532 Pain in left wrist: Secondary | ICD-10-CM

## 2019-04-18 DIAGNOSIS — M25571 Pain in right ankle and joints of right foot: Secondary | ICD-10-CM

## 2019-04-18 DIAGNOSIS — M25562 Pain in left knee: Secondary | ICD-10-CM

## 2019-04-18 DIAGNOSIS — M25539 Pain in unspecified wrist: Secondary | ICD-10-CM | POA: Diagnosis not present

## 2019-04-18 NOTE — Assessment & Plan Note (Signed)
This still swells Quick [place] still shows some fluid in Post tib sheath

## 2019-04-18 NOTE — Assessment & Plan Note (Signed)
Brittany Whitney is followed by Dr Brittany Whitney in rheumatology She clearly has a lot of inflammatory change with minimal trauma on several times we have done [place] of her joints and tendons  I think increased activity caused a flare Starting to affect her with ADLs Considering using some wrist supports Already on medicines  She may have to consider a return to using Humira at some point

## 2019-04-18 NOTE — Assessment & Plan Note (Signed)
This has improved a lot symptomatically  However with moderate effusion persisting there is likely some inflammatory or arthritic component that is chronic  Cont conservative care

## 2019-04-18 NOTE — Progress Notes (Signed)
CC: bilateral wrist pain/ improving left knee pain  Patient noted that cooking and activity over [employer] led to both wrists becoming painful Hurts to cut or dice Hurts to lift pots or pans On RT pain more over dorsum of thumb On Lt pain is more over medical flexior wrist She thinks they swell a bit Can be TT touch  Left knee gradually but dramatically improved since we took off 30CC bloody fluid and did CSI Now is back exercising and does not feel knee pain  Past Hx Crohn's Cervical disc Chronic joint pains  Chronic medications listed Needs daily tramadol bid and meloxicam daily for joint pains  ROS No giving way of left knee No locking No fever but does get redness at wrists 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 2 Bull;'s eye sign around FCR tendon No RC joint effusion Tendons intac  Right wrist Hypoechoic change around Compt 1 Degenerative change and effusion over TFCC area Tendons intact  Impression:  Some tendon sheath swelling in right and left wrist consistent with inflammatory change  Ultrasound and interpretation by Wolfgang Phoenix. Eda Magnussen, MD  Adendum: quick scan of left knee shows moderate effusion persists in SPP but is much smaller

## 2019-04-27 DIAGNOSIS — M25531 Pain in right wrist: Secondary | ICD-10-CM | POA: Diagnosis not present

## 2019-04-27 DIAGNOSIS — M79601 Pain in right arm: Secondary | ICD-10-CM | POA: Diagnosis not present

## 2019-04-27 DIAGNOSIS — M2550 Pain in unspecified joint: Secondary | ICD-10-CM | POA: Diagnosis not present

## 2019-04-27 DIAGNOSIS — M542 Cervicalgia: Secondary | ICD-10-CM | POA: Diagnosis not present

## 2019-05-03 ENCOUNTER — Other Ambulatory Visit: Payer: Self-pay | Admitting: Sports Medicine

## 2019-05-27 ENCOUNTER — Other Ambulatory Visit: Payer: Self-pay | Admitting: Cardiology

## 2019-05-27 DIAGNOSIS — E782 Mixed hyperlipidemia: Secondary | ICD-10-CM

## 2019-05-27 MED ORDER — OMEGA-3 1000 MG PO CAPS: 2.0000 | ORAL_CAPSULE | Freq: Two times a day (BID) | ORAL | 3 refills | Status: DC

## 2019-07-17 ENCOUNTER — Telehealth: Payer: Self-pay

## 2019-07-17 MED ORDER — MELOXICAM 15 MG PO TABS: 15.0000 mg | ORAL_TABLET | Freq: Every day | ORAL | 2 refills | Status: DC

## 2019-07-17 MED ORDER — TRAMADOL HCL 50 MG PO TABS: 50.0000 mg | ORAL_TABLET | Freq: Two times a day (BID) | ORAL | 1 refills | Status: DC

## 2019-07-17 NOTE — Telephone Encounter (Signed)
Refills sent to new pharmacy.

## 2019-07-20 ENCOUNTER — Telehealth: Payer: Self-pay

## 2019-07-20 DIAGNOSIS — E782 Mixed hyperlipidemia: Secondary | ICD-10-CM

## 2019-07-20 MED ORDER — OMEGA-3 1000 MG PO CAPS: 2.0000 | ORAL_CAPSULE | Freq: Two times a day (BID) | ORAL | 0 refills | Status: DC

## 2019-07-20 NOTE — Telephone Encounter (Signed)
Pharmacy called requesting new prescription. New script sent.

## 2019-08-17 ENCOUNTER — Telehealth: Payer: Self-pay

## 2019-08-17 NOTE — Telephone Encounter (Signed)
She needs an appointment electively at some point, have not seen her > 1year. Yes she may stop ASA if it is very important, but unusual for dentist to stop ASA. JG

## 2019-08-17 NOTE — Telephone Encounter (Signed)
Patient called stating she will be having 2x root canals to be done on 5/4 in the am by Dr Loyal Gambler, [facility] @  [employer] [phone] - Dentist is asking if she can stop baby aspirin 4 days before. Will be taking antibiotics 1 hr before surgery per patient.

## 2019-08-18 ENCOUNTER — Other Ambulatory Visit: Payer: Self-pay

## 2019-08-18 ENCOUNTER — Encounter: Payer: Self-pay | Admitting: Cardiology

## 2019-08-18 ENCOUNTER — Ambulatory Visit: Payer: Medicare Other | Admitting: Cardiology

## 2019-08-18 VITALS — BP 130/69 | HR 80 | Temp 97.7°F | Resp 15 | Ht 67.0 in | Wt 123.0 lb

## 2019-08-18 DIAGNOSIS — I351 Nonrheumatic aortic (valve) insufficiency: Secondary | ICD-10-CM

## 2019-08-18 DIAGNOSIS — E781 Pure hyperglyceridemia: Secondary | ICD-10-CM

## 2019-08-18 DIAGNOSIS — R0609 Other forms of dyspnea: Secondary | ICD-10-CM

## 2019-08-18 DIAGNOSIS — Z298 Encounter for other specified prophylactic measures: Secondary | ICD-10-CM

## 2019-08-18 DIAGNOSIS — Z9889 Other specified postprocedural states: Secondary | ICD-10-CM

## 2019-08-18 DIAGNOSIS — Z8679 Personal history of other diseases of the circulatory system: Secondary | ICD-10-CM

## 2019-08-18 DIAGNOSIS — R0600 Dyspnea, unspecified: Secondary | ICD-10-CM

## 2019-08-18 MED ORDER — ICOSAPENT ETHYL 1 G PO CAPS: 2.0000 g | ORAL_CAPSULE | Freq: Two times a day (BID) | ORAL | 6 refills | Status: DC

## 2019-08-18 NOTE — Progress Notes (Signed)
Primary Physician/Referring:  Fanny Bien, MD  Patient ID: Brittany Whitney, female    DOB: 1956/02/17, 64 y.o.   MRN: [id number]  Chief Complaint  Patient presents with  . Medical Clearance  . aortic valve   HPI:    Brittany Whitney  is a 64 y.o. female who presents for a follow-up for Valvular heart disease.  She is a retired Marine scientist by profession, had bicuspid aortic valve and severe aortic regurgitation, normal coronary arteries by angiography on 10/27/16, familial hypertriglyceridemia, mild chronic dyspnea, psoriatic arthritis, Crohn's disease, underwent aortic valve repair and reduction aortoplasty on 08/07/2017 at [facility].  Patient is here on a annual office visit, she is presently doing well, but states that she is getting slightly short of breath when she does things and is wondering if her valve is functioning normally. Due to low blood pressure she is not on ARB or beta blocker.    Past Medical History:  Diagnosis Date  . Arthritis   . Crohn's colitis ([place])   . Endometrial polyp   . History of exercise stress test 12-25-2016  by dr Einar Gip   no evidence ischemia, exercise tolerence excellent, no significant arrhythmias  . History of malignant melanoma of skin    2005  post excision back area-- localized  . Hypothyroidism   . Ileostomy in place [facility])    for crohn's colitis -- external pouch  . Migraines   . Severe aortic valve regurgitation cardiologist-  dr Einar Gip--  dx by echo 2017   echo 06/ 10/2016 no change except noted mod. to sev. pulmonry htn/  verified by cardiac cath 07/ 2018 showed no evidence pulmonary htn  . Vitamin D deficiency    Past Surgical History:  Procedure Laterality Date  . ABDOMINAL EXPLORATION SURGERY  x6  1988 (first one) --  1994 (last one)   proctocolectomy w/ ileostomy 1988;  revision 1989, 1991, 1992 internal pouch;  temporary external pouch 1993 then [place]  . BREAST BIOPSY Right 1998   benign  . BUNIONECTOMY Right   .  CARPAL TUNNEL RELEASE Right 09/2016  . DILATATION & CURETTAGE/HYSTEROSCOPY WITH MYOSURE N/A 01/21/2017   Procedure: DILATATION & CURETTAGE/HYSTEROSCOPY WITH MYOSURE;  Surgeon: Arvella Nigh, MD;  Location: [place];  Service: Gynecology;  Laterality: N/A;  . LIPOMA EXCISION  x2   left shouler area  . MELANOMA EXCISION  2005   back area  . RIGHT/LEFT HEART CATH AND CORONARY ANGIOGRAPHY N/A 10/27/2016   Procedure: Right/Left Heart Cath and Coronary Angiography;  Surgeon: Adrian Prows, MD;  Location: [place] [facility];  Service: Cardiovascular;  Laterality: N/A;  normal coronary arteries, normal LVSF and LVEDP, moderate to severe 3+ aortic regurg.,  no evidence pulmonry hypertension, normal cardiac output and index  . THORACIC AORTOGRAM N/A 10/27/2016   Procedure: Thoracic Aortogram;  Surgeon: Adrian Prows, MD;  Location: [place] [facility];  Service: Cardiovascular;  Laterality: N/A;  . TRANSTHORACIC ECHOCARDIOGRAM  10/15/2016   dr Einar Gip   ef 68%, grade 1 diastolic dysfunction/ moderate to severe aortic valve regurg., no evidence of stenosis/  mild to moderate TR/  PASP 75mHg , no evidence pulmonary hypertension  . TUBAL LIGATION Bilateral yrs ago   Family History  Problem Relation Age of Onset  . Breast cancer Mother 635 . Breast cancer Maternal Aunt        after 50  . Breast cancer Paternal Aunt        after 574  Social History   Tobacco Use  . Smoking status: Never Smoker  . Smokeless tobacco: Never Used  Substance Use Topics  . Alcohol use: Yes    Alcohol/week: 7.0 standard drinks    Types: 7 Glasses of wine per week   Marital Status: Married  ROS  Review of Systems  Cardiovascular: Positive for dyspnea on exertion. Negative for chest pain and leg swelling.  Musculoskeletal: Positive for arthritis (Psoriatic arthritis involving bilateral hands).  Gastrointestinal: Negative for melena.   Objective  Blood pressure 130/69, pulse 80, temperature 97.7 F (36.5 C),  temperature source Temporal, resp. rate 15, height 5' 7"  (1.702 m), weight 123 lb (55.8 kg), SpO2 99 %.  Vitals with BMI 08/18/2019 04/18/2019 03/07/2019  Height 5' 7"  5' 7"  5' 7"   Weight 123 lbs 125 lbs 125 lbs  BMI 19.26 27.74 12.87  Systolic 867 672 094  Diastolic 69 62 68  Pulse 80 - -     Physical Exam  Cardiovascular: Normal rate, regular rhythm and intact distal pulses. Exam reveals no gallop.  Murmur heard. High-pitched blowing decrescendo early diastolic murmur is present with a grade of 3/6 at the upper right sternal border radiating to the apex. No leg edema, no JVD.  Pulmonary/Chest: Effort normal and breath sounds normal.  Abdominal: Soft. Bowel sounds are normal.   Laboratory examination:   No results for input(s): NA, K, CL, CO2, GLUCOSE, BUN, CREATININE, CALCIUM, GFRNONAA, GFRAA in the last 8760 hours. CrCl cannot be calculated (Patient's most recent lab result is older than the maximum 21 days allowed.).  CMP Latest Ref Rng & Units 01/21/2017 10/27/2016 04/23/2010  Glucose 65 - 99 mg/dL 84 90 68(L)  BUN 6 - 20 mg/dL 34(H) 23(H) 21  Creatinine 0.44 - 1.00 mg/dL 0.95 0.90 0.82  Sodium 135 - 145 mmol/L 133(L) 133(L) 138  Potassium 3.5 - 5.1 mmol/L 3.6 3.2(L) 3.5  Chloride 101 - 111 mmol/L 101 100(L) 104  CO2 22 - 32 mmol/L 20(L) 23 27  Calcium 8.9 - 10.3 mg/dL 9.5 9.3 9.6   CBC Latest Ref Rng & Units 01/21/2017 12/10/2011 12/04/2010  WBC 4.0 - 10.5 K/uL 7.2 5.7 7.7  Hemoglobin 12.0 - 15.0 g/dL 12.2 12.3 13.3  Hematocrit 36.0 - 46.0 % 35.0(L) 35.5(L) 39.7  Platelets 150 - 400 K/uL 277 200 238   Lipid Panel  No results found for: CHOL, TRIG, HDL, CHOLHDL, VLDL, LDLCALC, LDLDIRECT HEMOGLOBIN A1C No results found for: HGBA1C, MPG TSH No results for input(s): TSH in the last 8760 hours.  External labs:   Cholesterol, total 218.000 M 03/21/2019 HDL 49.000 M 03/21/2019 LDL 117 Triglycerides 262.000 M 03/21/2019  Hemoglobin 12.000 G/ 04/27/2019 Platelets 302.000 X  04/27/2019  Creatinine, Serum 0.950 MG/ 04/27/2019 Potassium 4.000 05/25/2017 ALT (SGPT) 15.000 IU/ 04/27/2019  TSH 2.560 12/30/2018  09/29/2017: WBC 12.03, RBC 4.39, H/H 12.2/38.8, MCV 88.4, Platelets 372. Rest of CBC Normal.  Glucose 106, BUN 26, Creat 0.95, Na/K 134/4.4.   08/09/2017: TSH 1.76 Normal.  Medications and allergies   Allergies  Allergen Reactions  . Clindamycin/Lincomycin     Please avoid - questionable allergy - possible rash?  . Flagyl [Metronidazole] Other (See Comments)    Numbness in legs and arms  . Penicillins Hives  . Ceclor [Cefaclor] Rash     Current Outpatient Medications  Medication Instructions  . Biotin 5000 MCG TABS 1 tablet, Oral, Daily  . Bismuth Subgallate (DEVROM) 200 MG CHEW 1 tablet, Oral, 2 times daily  . CALCIUM & MAGNESIUM  CARBONATES PO 3 tablets, Oral, 2 times daily, 500/500  . Cholecalciferol 5,000 Units, Oral, 2 times daily  . DHEA 10 MG CAPS 1 capsule, Oral, BH-each morning  . Diindolylmethane POWD 1 capsule, Oral, 2 times daily, 100 mg capsule  . doxycycline (PERIOSTAT) 20 mg, 2 times daily  . estradiol (VIVELLE-DOT) 0.025 MG/24HR APPLY 1 PATCH TWICE WEEKLY AS DIRECTED.  Marland Kitchen ibuprofen (ADVIL) 200 mg, Oral, Every 6 hours PRN  . icosapent Ethyl (VASCEPA) 2 g, Oral, 2 times daily  . levothyroxine (SYNTHROID) 125 mcg, Oral, Daily before breakfast  . meloxicam (MOBIC) 15 mg, Oral, Daily  . Multiple Vitamin (MULTIVITAMIN WITH MINERALS) TABS 1 tablet, Oral, Daily  . naratriptan (AMERGE) 2.5 mg, As needed  . predniSONE (DELTASONE) 5 mg, Oral, Every other day, TAKES IN AM  . PRESCRIPTION MEDICATION 1 application, Apply externally, Daily, Compound cream: testosterone .015% in a 10 ml syringe. Apply 1 ml appilcation to thin skinned area(s)  . progesterone (PROMETRIUM) 125 mg, Oral, Daily at bedtime, COMPOUND MEDICATION IN PILL FORM  . sulfaSALAzine (AZULFIDINE) 1,000 mg, Oral, 2 times daily  . SUMAtriptan (IMITREX) 50 mg, Every 2 hours PRN  .  traMADol (ULTRAM) 50 mg, Oral, 2 times daily  . Vitamin D (Ergocalciferol) (DRISDOL) 50,000 Units, Oral, Every 14 days  . zolpidem (AMBIEN) 5 mg, Oral, Daily at bedtime, for sleep   Radiology:   No results found.  Cardiac Studies:   Right and left heart catheterization 10/27/2016: Normal coronary arteries. Normal LV systolic function. Normal LVEDP. Moderate to severe 3+ aortic regurgitation. Mild aortic root dilatation. No evidence of thoracic aortic aneurysm. No evidence of pulmonary hypertension. Normal card output and index. Recommendation: At this point, unless patient continues to symptoms of dyspnea, we'll continue medical therapy. No evidence of pulmonary hypertension. She probably will eventually need aortic valve replacement. Left ventricle is not enlarged or dilated and the EDP is normal hence could continue medical therapy for now. 90 mL contrast utilized.  Echocardiogram 01/12/2019:  Left ventricle cavity is normal in size. Hypokinetic global wall motion, mild. Abnormal septal wall motion due to post-operative valve. Normal diastolic filling pattern. Mildly depressed LV systolic function with visual EF 45-50%. Calculated EF 53%.  Mild aortic valve leaflet thickening and calcification. Bicuspid aortic valve. Moderate (Grade II) aortic regurgitation. Fused right and left coronary cusps. (Patient has h/o AV repair).  The aortic root is mildly dilated at 4.0 cm. Mildly dilated ascending aorta at 4.2 cm.  Compared to the report on 09/29/2017, aortic regurgitation was mild and Ao root measured 3.7 cm.Mildly decreased LV global strain noted previously.  EKG  EKG 08/18/2019 normal sinus rhythm at rate of 66 bpm, borderline left atrial abnormality, left axis deviation, left anterior fascicular block.  Incomplete right bundle branch block.  Poor R wave progression, probably normal variant but cannot exclude anteroseptal infarct old.  No evidence of ischemia.  No significant change from  09/15/2017.   Assessment     ICD-10-CM   1. Dyspnea on exertion  R06.00 EKG 12-Lead    PCV ECHOCARDIOGRAM COMPLETE  2. S/P ascending aortic aneurysm repair  Z98.890 PCV ECHOCARDIOGRAM COMPLETE   Z86.79 icosapent Ethyl (VASCEPA) 1 g capsule  3. H/O aortic valve repair  Z98.890 EKG 12-Lead   Z86.79   4. Moderate aortic regurgitation  I35.1   5. SBE (subacute bacterial endocarditis) prophylaxis candidate  Z29.8   6. Pure hypertriglyceridemia  E78.1      Meds ordered this encounter  Medications  . icosapent Ethyl (  VASCEPA) 1 g capsule    Sig: Take 2 capsules (2 g total) by mouth 2 (two) times daily.    Dispense:  120 capsule    Refill:  6    Medications Discontinued During This Encounter  Medication Reason  . Omega-3 1000 MG CAPS Ineffective    Recommendations:   Brittany Whitney  is a 64 y.o. female who presents for a follow-up for Valvular heart disease.  She is a retired Marine scientist by profession, had bicuspid aortic valve and severe aortic regurgitation, normal coronary arteries by angiography on 10/27/16, familial hypertriglyceridemia, mild chronic dyspnea, psoriatic arthritis, Crohn's disease, underwent aortic valve repair and reduction aortoplasty on 08/07/2017 at [facility].  She has noticed dyspnea on exertion, I will order echo to f/u on AV disorder and aortic regurgitation.  No clinical evidence of heart failure.  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  LDL is also elevated but would target triglycerides for now.  Triglycerides were well controlled on Vascepa, since being on generic omega-3, they are trended up again.  In view of vascular disease, will try to get prior authorization.  She is having dental procedure done and involves deep tissue manipulation, she is aware of endocarditis prophylaxis.  She would like to hold aspirin for 5 days.  Office visit in a year.  Adrian Prows,  MD, [facility] 08/18/2019, 2:32 PM [facility] [place] Pager: [phone] Office: [phone]

## 2019-08-18 NOTE — Telephone Encounter (Signed)
Patient will come in today at 1:30 for a fu appt - Thank you.

## 2019-08-30 ENCOUNTER — Ambulatory Visit: Payer: Medicare Other

## 2019-08-30 ENCOUNTER — Other Ambulatory Visit: Payer: Self-pay

## 2019-08-30 DIAGNOSIS — R0600 Dyspnea, unspecified: Secondary | ICD-10-CM

## 2019-08-30 DIAGNOSIS — R0609 Other forms of dyspnea: Secondary | ICD-10-CM

## 2019-08-30 DIAGNOSIS — Z8679 Personal history of other diseases of the circulatory system: Secondary | ICD-10-CM

## 2019-09-11 DIAGNOSIS — Z8679 Personal history of other diseases of the circulatory system: Secondary | ICD-10-CM

## 2019-09-11 DIAGNOSIS — I351 Nonrheumatic aortic (valve) insufficiency: Secondary | ICD-10-CM

## 2019-09-11 NOTE — Telephone Encounter (Signed)
I discussed the findings of the echocardiogram the patient, advised her that she is symptomatic with dyspnea, either watchful waiting and if her symptoms are getting worse to proceed with aortic valve replacement versus making a referral to the CT surgeon now.  Patient would like to wait for now, I will repeat echocardiogram in 6 months and see her back then.    ICD-10-CM   1. S/P ascending aortic aneurysm repair  Z98.890 PCV ECHOCARDIOGRAM COMPLETE   Z86.79   2. Severe aortic regurgitation  I35.1 PCV ECHOCARDIOGRAM COMPLETE    Adrian Prows, MD, [facility] 09/11/2019, 2:01 PM [facility]. [place] Office: [phone]

## 2019-09-11 NOTE — Telephone Encounter (Signed)
I have placed order for an echocardiogram in 6 months and I would like to see her back after the echocardiogram instead of in 1 year.  You can send her message of the timing via [place].

## 2019-09-11 NOTE — Telephone Encounter (Signed)
From pt

## 2019-09-20 ENCOUNTER — Other Ambulatory Visit: Payer: Self-pay | Admitting: Sports Medicine

## 2019-09-21 ENCOUNTER — Other Ambulatory Visit: Payer: Self-pay | Admitting: Sports Medicine

## 2019-09-21 MED ORDER — TRAMADOL HCL 50 MG PO TABS: 50.0000 mg | ORAL_TABLET | Freq: Two times a day (BID) | ORAL | 1 refills | Status: DC

## 2019-10-17 ENCOUNTER — Other Ambulatory Visit: Payer: Self-pay | Admitting: Obstetrics and Gynecology

## 2019-10-17 DIAGNOSIS — Z1231 Encounter for screening mammogram for malignant neoplasm of breast: Secondary | ICD-10-CM

## 2019-11-14 NOTE — Telephone Encounter (Signed)
Schedule her for an OV and then also schedule her for right and left heart cath. Indication: Severe aortic regurgitation

## 2019-11-20 ENCOUNTER — Ambulatory Visit
Admission: RE | Admit: 2019-11-20 | Discharge: 2019-11-20 | Disposition: A | Discharge status: Home / Self Care | Payer: Medicare Other | Source: Ambulatory Visit | Attending: Obstetrics and Gynecology | Admitting: Obstetrics and Gynecology

## 2019-11-20 ENCOUNTER — Other Ambulatory Visit: Payer: Self-pay | Admitting: *Deleted

## 2019-11-20 ENCOUNTER — Other Ambulatory Visit: Payer: Self-pay

## 2019-11-20 DIAGNOSIS — Z1231 Encounter for screening mammogram for malignant neoplasm of breast: Secondary | ICD-10-CM

## 2019-11-20 MED ORDER — TRAMADOL HCL 50 MG PO TABS: 50.0000 mg | ORAL_TABLET | Freq: Two times a day (BID) | ORAL | 1 refills | Status: DC

## 2019-11-27 ENCOUNTER — Ambulatory Visit: Payer: BC Managed Care – PPO | Admitting: Cardiology

## 2019-12-04 ENCOUNTER — Ambulatory Visit: Payer: Medicare Other | Admitting: Cardiology

## 2019-12-04 ENCOUNTER — Encounter: Payer: Self-pay | Admitting: Cardiology

## 2019-12-04 ENCOUNTER — Other Ambulatory Visit: Payer: Self-pay

## 2019-12-04 VITALS — BP 119/71 | HR 84 | Resp 16 | Ht 67.0 in | Wt 129.0 lb

## 2019-12-04 DIAGNOSIS — Z298 Encounter for other specified prophylactic measures: Secondary | ICD-10-CM

## 2019-12-04 DIAGNOSIS — R0609 Other forms of dyspnea: Secondary | ICD-10-CM

## 2019-12-04 DIAGNOSIS — I351 Nonrheumatic aortic (valve) insufficiency: Secondary | ICD-10-CM

## 2019-12-04 DIAGNOSIS — E782 Mixed hyperlipidemia: Secondary | ICD-10-CM

## 2019-12-04 DIAGNOSIS — Z8679 Personal history of other diseases of the circulatory system: Secondary | ICD-10-CM

## 2019-12-04 DIAGNOSIS — R0600 Dyspnea, unspecified: Secondary | ICD-10-CM

## 2019-12-04 NOTE — H&P (View-Only) (Signed)
Primary Physician/Referring:  Brittany Bien, MD  Patient ID: Brittany Whitney, female    DOB: 1956-03-29, 64 y.o.   MRN: [id number]  Chief Complaint  Patient presents with  . Severe Aortic Regurgitation  . Follow-up    4 month   HPI:    Brittany Whitney  is a 64 y.o. female who presents for a follow-up for Valvular heart disease.  She is a retired Marine scientist by profession, had bicuspid aortic valve and severe aortic regurgitation, normal coronary arteries by angiography on 10/27/16, familial hypertriglyceridemia, mild chronic dyspnea, psoriatic arthritis, Crohn's disease S/P colectomy and ileostomy placement, underwent aortic valve repair and reduction aortoplasty on 08/07/2017 at [facility].  Due to shortness of breath and gradually worsened aortic regurgitation, she is now scheduled for right and left heart catheterization and also scheduled for aortic valve replacement again at [facility] clinic.  No other specific symptoms, no PND or orthopnea, no leg edema.  Past Medical History:  Diagnosis Date  . Arthritis   . Crohn's colitis ([place])   . Endometrial polyp   . History of exercise stress test 12-25-2016  by dr Einar Gip   no evidence ischemia, exercise tolerence excellent, no significant arrhythmias  . History of malignant melanoma of skin    2005  post excision back area-- localized  . Hypothyroidism   . Ileostomy in place [facility])    for crohn's colitis -- external pouch  . Migraines   . Severe aortic valve regurgitation cardiologist-  dr Einar Gip--  dx by echo 2017   echo 06/ 10/2016 no change except noted mod. to sev. pulmonry htn/  verified by cardiac cath 07/ 2018 showed no evidence pulmonary htn  . Vitamin D deficiency    Past Surgical History:  Procedure Laterality Date  . ABDOMINAL EXPLORATION SURGERY  x6  1988 (first one) --  1994 (last one)   proctocolectomy w/ ileostomy 1988;  revision 1989, 1991, 1992 internal pouch;  temporary external pouch 1993 then [place]  .  BREAST BIOPSY Right 1998   benign  . BUNIONECTOMY Right   . CARPAL TUNNEL RELEASE Right 09/2016  . DILATATION & CURETTAGE/HYSTEROSCOPY WITH MYOSURE N/A 01/21/2017   Procedure: DILATATION & CURETTAGE/HYSTEROSCOPY WITH MYOSURE;  Surgeon: Arvella Nigh, MD;  Location: [place];  Service: Gynecology;  Laterality: N/A;  . LIPOMA EXCISION  x2   left shouler area  . MELANOMA EXCISION  2005   back area  . RIGHT/LEFT HEART CATH AND CORONARY ANGIOGRAPHY N/A 10/27/2016   Procedure: Right/Left Heart Cath and Coronary Angiography;  Surgeon: Adrian Prows, MD;  Location: [place] [facility];  Service: Cardiovascular;  Laterality: N/A;  normal coronary arteries, normal LVSF and LVEDP, moderate to severe 3+ aortic regurg.,  no evidence pulmonry hypertension, normal cardiac output and index  . THORACIC AORTOGRAM N/A 10/27/2016   Procedure: Thoracic Aortogram;  Surgeon: Adrian Prows, MD;  Location: [place] [facility];  Service: Cardiovascular;  Laterality: N/A;  . TRANSTHORACIC ECHOCARDIOGRAM  10/15/2016   dr Einar Gip   ef 73%, grade 1 diastolic dysfunction/ moderate to severe aortic valve regurg., no evidence of stenosis/  mild to moderate TR/  PASP 20mHg , no evidence pulmonary hypertension  . TUBAL LIGATION Bilateral yrs ago   Family History  Problem Relation Age of Onset  . Breast cancer Mother 614 . Breast cancer Maternal Aunt        after 50  . Breast cancer Paternal Aunt        after  50    Social History   Tobacco Use  . Smoking status: Never Smoker  . Smokeless tobacco: Never Used  Substance Use Topics  . Alcohol use: Yes    Alcohol/week: 7.0 standard drinks    Types: 7 Glasses of wine per week   Marital Status: Married  ROS  Review of Systems  Cardiovascular: Positive for dyspnea on exertion. Negative for chest pain and leg swelling.  Musculoskeletal: Positive for arthritis (Psoriatic arthritis involving bilateral hands).  Gastrointestinal: Negative for melena.   Objective    Blood pressure 119/71, pulse 84, resp. rate 16, height _0  (1.702 m), weight 129 lb (58.5 kg), SpO2 99 %.  Vitals with BMI 12/04/2019 08/18/2019 04/18/2019  Height _1  _2  _3   Weight 129 lbs 123 lbs 125 lbs  BMI 20.2 50.35 46.56  Systolic 812 751 700  Diastolic 71 69 62  Pulse 84 80 -     Physical Exam Cardiovascular:     Rate and Rhythm: Normal rate and regular rhythm.     Pulses: Intact distal pulses.     Heart sounds: Murmur heard. High-pitched blowing decrescendo early diastolic murmur is present with a grade of 3/4 at the upper right sternal border radiating to the apex.  No gallop.      Comments: No leg edema, no JVD. Pulmonary:     Effort: Pulmonary effort is normal.     Breath sounds: Normal breath sounds.  Abdominal:     General: Bowel sounds are normal.     Palpations: Abdomen is soft.    Laboratory examination:   No results for input(s): NA, K, CL, CO2, GLUCOSE, BUN, CREATININE, CALCIUM, GFRNONAA, GFRAA in the last 8760 hours. CrCl cannot be calculated (Patient's most recent lab result is older than the maximum 21 days allowed.).  CMP Latest Ref Rng & Units 01/21/2017 10/27/2016 04/23/2010  Glucose 65 - 99 mg/dL 84 90 68(L)  BUN 6 - 20 mg/dL 34(H) 23(H) 21  Creatinine 0.44 - 1.00 mg/dL 0.95 0.90 0.82  Sodium 135 - 145 mmol/L 133(L) 133(L) 138  Potassium 3.5 - 5.1 mmol/L 3.6 3.2(L) 3.5  Chloride 101 - 111 mmol/L 101 100(L) 104  CO2 22 - 32 mmol/L 20(L) 23 27  Calcium 8.9 - 10.3 mg/dL 9.5 9.3 9.6   CBC Latest Ref Rng & Units 01/21/2017 12/10/2011 12/04/2010  WBC 4.0 - 10.5 K/uL 7.2 5.7 7.7  Hemoglobin 12.0 - 15.0 g/dL 12.2 12.3 13.3  Hematocrit 36 - 46 % 35.0(L) 35.5(L) 39.7  Platelets 150 - 400 K/uL 277 200 238   Lipid Panel  No results found for: CHOL, TRIG, HDL, CHOLHDL, VLDL, LDLCALC, LDLDIRECT HEMOGLOBIN A1C No results found for: HGBA1C, MPG TSH No results for input(s): TSH in the last 8760 hours.  External labs:   Cholesterol, total 218.000 M  03/21/2019 HDL 49.000 M 03/21/2019 LDL 117 Triglycerides 262.000 M 03/21/2019  Hemoglobin 12.000 G/ 04/27/2019 Platelets 302.000 X 04/27/2019  Creatinine, Serum 0.950 MG/ 04/27/2019 Potassium 4.000 05/25/2017 ALT (SGPT) 15.000 IU/ 04/27/2019  TSH 2.560 12/30/2018  09/29/2017: WBC 12.03, RBC 4.39, H/H 12.2/38.8, MCV 88.4, Platelets 372. Rest of CBC Normal.  Glucose 106, BUN 26, Creat 0.95, Na/K 134/4.4.   08/09/2017: TSH 1.76 Normal.  Medications and allergies   Allergies  Allergen Reactions  . Flagyl [Metronidazole] Other (See Comments)    Numbness in legs and arms  . Penicillins Hives  . Ceclor [Cefaclor] Rash     Current Outpatient Medications  Medication Instructions  .  ALPRAZolam (XANAX) 0.25 mg, Oral, At bedtime PRN  . aspirin EC 81 mg, Oral, Daily, Swallow whole.   . Biotin 5000 MCG TABS 1 tablet, Oral, Daily  . Bismuth Subgallate (DEVROM) 200 MG CHEW 1 tablet, Oral, 2 times daily  . Cholecalciferol 5,000 Units, Oral, 2 times daily  . cyclobenzaprine (FLEXERIL) 5 mg, Oral, Daily, One tablet at bedtime.   Marland Kitchen DHEA 10 MG CAPS 1 capsule, Oral, BH-each morning  . Diindolylmethane POWD 1 capsule, Oral, 2 times daily, 100 mg capsule  . doxycycline (PERIOSTAT) 20 mg, 2 times daily  . ibuprofen (ADVIL) 200 mg, Oral, Every 6 hours PRN  . icosapent Ethyl (VASCEPA) 2 g, Oral, 2 times daily  . levothyroxine (SYNTHROID) 137 mcg, Oral, Daily before breakfast  . MAGNESIUM GLYCINATE PO 200 mg, Oral, One tablet at bedtime.   Marland Kitchen MAGNESIUM MALATE PO 141 mg, Oral, One tablet every morning.   . meloxicam (MOBIC) 15 mg, Oral, Daily  . naratriptan (AMERGE) 2.5 mg, As needed  . predniSONE (DELTASONE) 5 mg, Oral, Every other day, TAKES IN AM  . progesterone (PROMETRIUM) 125 mg, Oral, Daily at bedtime, COMPOUND MEDICATION IN PILL FORM  . rosuvastatin (CRESTOR) 5 mg, Oral, Weekly, One tablet once a week   . sulfaSALAzine (AZULFIDINE) 1,000 mg, Oral, 2 times daily  . SUMAtriptan (IMITREX) 50 mg, Every 2  hours PRN  . traMADol (ULTRAM) 50 mg, Oral, 2 times daily  . Vitamin D, Ergocalciferol, (DRISDOL) 50000 UNITS CAPS Oral, Every 14 days  . Vitamin K2 100 mcg, Oral, Daily  . zolpidem (AMBIEN) 10 mg, Oral, Daily at bedtime, for sleep   Radiology:   No results found.  Cardiac Studies:   Right and left heart catheterization 10/27/2016: Normal coronary arteries. Normal LV systolic function. Normal LVEDP. Moderate to severe 3+ aortic regurgitation. Mild aortic root dilatation. No evidence of thoracic aortic aneurysm. No evidence of pulmonary hypertension. Normal card output and index. Recommendation: At this point, unless patient continues to symptoms of dyspnea, we'll continue medical therapy. No evidence of pulmonary hypertension. She probably will eventually need aortic valve replacement. Left ventricle is not enlarged or dilated and the EDP is normal hence could continue medical therapy for now. 90 mL contrast utilized.  Echocardiogram 08/30/2019:  Left ventricle cavity is normal in size and wall thickness. Abnormal  septal wall motion due to post-operative valve. Normal LV systolic  function with visual EF 50-55%. Doppler evidence of grade I (impaired)  diastolic dysfunction, normal LAP. Calculated EF 55%.  Bicuspid aortic valve with raphe between right and left coronary cusp. S/p  aortic valve repair. Aortic valve mean gradient of 14 mmHg, Vmax of 2.5  m/s. Calculated aortic valve area by continuity equation is 1 cm. Severe  grade IV aortic regurgitation.  Aortic root measured 3.8 cm. Mild tricuspid regurgitation.  No evidence of pulmonary hypertension.  No significant change compared to previous study on 01/12/2019. Previously LVEF 45-50%. Aortic root 4.2 cm.   EKG  EKG 08/18/2019 normal sinus rhythm at rate of 66 bpm, borderline left atrial abnormality, left axis deviation, left anterior fascicular block.  Incomplete right bundle branch block.  Poor R wave progression, probably normal  variant but cannot exclude anteroseptal infarct old.  No evidence of ischemia.  No significant change from 09/15/2017.   Assessment     ICD-10-CM   1. Severe aortic regurgitation  I35.1 CBC    CMP14+EGFR  2. S/P ascending aortic aneurysm repair  Z98.890    Z86.79   3.  H/O aortic valve repair  Z98.890    Z86.79   4. SBE (subacute bacterial endocarditis) prophylaxis candidate  Z29.8   5. Dyspnea on exertion  R06.00   6. Mixed hyperlipidemia  E78.2      No orders of the defined types were placed in this encounter.   Medications Discontinued During This Encounter  Medication Reason  . CALCIUM & MAGNESIUM CARBONATES PO Patient Preference  . estradiol (VIVELLE-DOT) 0.025 MG/24HR Patient Preference  . Multiple Vitamin (MULTIVITAMIN WITH MINERALS) TABS Patient Preference  . PRESCRIPTION MEDICATION Patient Preference    Recommendations:   Brittany Whitney  is a 64 y.o.  female who presents for a follow-up for Valvular heart disease.  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 Bill Salinas.  On her last office visit about 3 to 4 months ago, I had switched her back to Vascepa due to elevated triglycerides.  She has had lipid profile testing and is awaiting results.  She is also now on Crestor 5 mg daily.  Lipids being managed by PCP.  She has noticed dyspnea on exertion. No clinical evidence of heart failure.  She has been scheduled for aortic valve replacement at [facility] clinic, I will schedule her for right and left heart catheterization.  All questions answered. Schedule for cardiac catheterization.. We discussed regarding risks, benefits, alternatives to this  including stress testing, CTA and continued medical therapy. Patient wants to proceed. Understands <1-2% risk of death, stroke, MI, urgent CABG, bleeding, infection, renal failure but not limited to these.   She is having surgery Aug 31st, 2021 again at [facility] clinic by Dr. Bill Salinas.    Adrian Prows, MD, [facility] 12/04/2019, 12:53 PM Office: [phone]

## 2019-12-04 NOTE — Progress Notes (Signed)
Primary Physician/Referring:  Fanny Bien, MD  Patient ID: Brittany Whitney, female    DOB: 1956-03-29, 64 y.o.   MRN: [id number]  Chief Complaint  Patient presents with  . Severe Aortic Regurgitation  . Follow-up    4 month   HPI:    Brittany Whitney  is a 64 y.o. female who presents for a follow-up for Valvular heart disease.  She is a retired Marine scientist by profession, had bicuspid aortic valve and severe aortic regurgitation, normal coronary arteries by angiography on 10/27/16, familial hypertriglyceridemia, mild chronic dyspnea, psoriatic arthritis, Crohn's disease S/P colectomy and ileostomy placement, underwent aortic valve repair and reduction aortoplasty on 08/07/2017 at [facility].  Due to shortness of breath and gradually worsened aortic regurgitation, she is now scheduled for right and left heart catheterization and also scheduled for aortic valve replacement again at [facility] clinic.  No other specific symptoms, no PND or orthopnea, no leg edema.  Past Medical History:  Diagnosis Date  . Arthritis   . Crohn's colitis ([place])   . Endometrial polyp   . History of exercise stress test 12-25-2016  by dr Einar Gip   no evidence ischemia, exercise tolerence excellent, no significant arrhythmias  . History of malignant melanoma of skin    2005  post excision back area-- localized  . Hypothyroidism   . Ileostomy in place [facility])    for crohn's colitis -- external pouch  . Migraines   . Severe aortic valve regurgitation cardiologist-  dr Einar Gip--  dx by echo 2017   echo 06/ 10/2016 no change except noted mod. to sev. pulmonry htn/  verified by cardiac cath 07/ 2018 showed no evidence pulmonary htn  . Vitamin D deficiency    Past Surgical History:  Procedure Laterality Date  . ABDOMINAL EXPLORATION SURGERY  x6  1988 (first one) --  1994 (last one)   proctocolectomy w/ ileostomy 1988;  revision 1989, 1991, 1992 internal pouch;  temporary external pouch 1993 then [place]  .  BREAST BIOPSY Right 1998   benign  . BUNIONECTOMY Right   . CARPAL TUNNEL RELEASE Right 09/2016  . DILATATION & CURETTAGE/HYSTEROSCOPY WITH MYOSURE N/A 01/21/2017   Procedure: DILATATION & CURETTAGE/HYSTEROSCOPY WITH MYOSURE;  Surgeon: Arvella Nigh, MD;  Location: [place];  Service: Gynecology;  Laterality: N/A;  . LIPOMA EXCISION  x2   left shouler area  . MELANOMA EXCISION  2005   back area  . RIGHT/LEFT HEART CATH AND CORONARY ANGIOGRAPHY N/A 10/27/2016   Procedure: Right/Left Heart Cath and Coronary Angiography;  Surgeon: Adrian Prows, MD;  Location: [place] CV LAB;  Service: Cardiovascular;  Laterality: N/A;  normal coronary arteries, normal LVSF and LVEDP, moderate to severe 3+ aortic regurg.,  no evidence pulmonry hypertension, normal cardiac output and index  . THORACIC AORTOGRAM N/A 10/27/2016   Procedure: Thoracic Aortogram;  Surgeon: Adrian Prows, MD;  Location: [place] [facility];  Service: Cardiovascular;  Laterality: N/A;  . TRANSTHORACIC ECHOCARDIOGRAM  10/15/2016   dr Einar Gip   ef 73%, grade 1 diastolic dysfunction/ moderate to severe aortic valve regurg., no evidence of stenosis/  mild to moderate TR/  PASP 20mHg , no evidence pulmonary hypertension  . TUBAL LIGATION Bilateral yrs ago   Family History  Problem Relation Age of Onset  . Breast cancer Mother 614 . Breast cancer Maternal Aunt        after 50  . Breast cancer Paternal Aunt        after  50    Social History   Tobacco Use  . Smoking status: Never Smoker  . Smokeless tobacco: Never Used  Substance Use Topics  . Alcohol use: Yes    Alcohol/week: 7.0 standard drinks    Types: 7 Glasses of wine per week   Marital Status: Married  ROS  Review of Systems  Cardiovascular: Positive for dyspnea on exertion. Negative for chest pain and leg swelling.  Musculoskeletal: Positive for arthritis (Psoriatic arthritis involving bilateral hands).  Gastrointestinal: Negative for melena.   Objective    Blood pressure 119/71, pulse 84, resp. rate 16, height _0  (1.702 m), weight 129 lb (58.5 kg), SpO2 99 %.  Vitals with BMI 12/04/2019 08/18/2019 04/18/2019  Height _1  _2  _3   Weight 129 lbs 123 lbs 125 lbs  BMI 20.2 50.35 46.56  Systolic 812 751 700  Diastolic 71 69 62  Pulse 84 80 -     Physical Exam Cardiovascular:     Rate and Rhythm: Normal rate and regular rhythm.     Pulses: Intact distal pulses.     Heart sounds: Murmur heard. High-pitched blowing decrescendo early diastolic murmur is present with a grade of 3/4 at the upper right sternal border radiating to the apex.  No gallop.      Comments: No leg edema, no JVD. Pulmonary:     Effort: Pulmonary effort is normal.     Breath sounds: Normal breath sounds.  Abdominal:     General: Bowel sounds are normal.     Palpations: Abdomen is soft.    Laboratory examination:   No results for input(s): NA, K, CL, CO2, GLUCOSE, BUN, CREATININE, CALCIUM, GFRNONAA, GFRAA in the last 8760 hours. CrCl cannot be calculated (Patient's most recent lab result is older than the maximum 21 days allowed.).  CMP Latest Ref Rng & Units 01/21/2017 10/27/2016 04/23/2010  Glucose 65 - 99 mg/dL 84 90 68(L)  BUN 6 - 20 mg/dL 34(H) 23(H) 21  Creatinine 0.44 - 1.00 mg/dL 0.95 0.90 0.82  Sodium 135 - 145 mmol/L 133(L) 133(L) 138  Potassium 3.5 - 5.1 mmol/L 3.6 3.2(L) 3.5  Chloride 101 - 111 mmol/L 101 100(L) 104  CO2 22 - 32 mmol/L 20(L) 23 27  Calcium 8.9 - 10.3 mg/dL 9.5 9.3 9.6   CBC Latest Ref Rng & Units 01/21/2017 12/10/2011 12/04/2010  WBC 4.0 - 10.5 K/uL 7.2 5.7 7.7  Hemoglobin 12.0 - 15.0 g/dL 12.2 12.3 13.3  Hematocrit 36 - 46 % 35.0(L) 35.5(L) 39.7  Platelets 150 - 400 K/uL 277 200 238   Lipid Panel  No results found for: CHOL, TRIG, HDL, CHOLHDL, VLDL, LDLCALC, LDLDIRECT HEMOGLOBIN A1C No results found for: HGBA1C, MPG TSH No results for input(s): TSH in the last 8760 hours.  External labs:   Cholesterol, total 218.000 M  03/21/2019 HDL 49.000 M 03/21/2019 LDL 117 Triglycerides 262.000 M 03/21/2019  Hemoglobin 12.000 G/ 04/27/2019 Platelets 302.000 X 04/27/2019  Creatinine, Serum 0.950 MG/ 04/27/2019 Potassium 4.000 05/25/2017 ALT (SGPT) 15.000 IU/ 04/27/2019  TSH 2.560 12/30/2018  09/29/2017: WBC 12.03, RBC 4.39, H/H 12.2/38.8, MCV 88.4, Platelets 372. Rest of CBC Normal.  Glucose 106, BUN 26, Creat 0.95, Na/K 134/4.4.   08/09/2017: TSH 1.76 Normal.  Medications and allergies   Allergies  Allergen Reactions  . Flagyl [Metronidazole] Other (See Comments)    Numbness in legs and arms  . Penicillins Hives  . Ceclor [Cefaclor] Rash     Current Outpatient Medications  Medication Instructions  .  ALPRAZolam (XANAX) 0.25 mg, Oral, At bedtime PRN  . aspirin EC 81 mg, Oral, Daily, Swallow whole.   . Biotin 5000 MCG TABS 1 tablet, Oral, Daily  . Bismuth Subgallate (DEVROM) 200 MG CHEW 1 tablet, Oral, 2 times daily  . Cholecalciferol 5,000 Units, Oral, 2 times daily  . cyclobenzaprine (FLEXERIL) 5 mg, Oral, Daily, One tablet at bedtime.   Marland Kitchen DHEA 10 MG CAPS 1 capsule, Oral, BH-each morning  . Diindolylmethane POWD 1 capsule, Oral, 2 times daily, 100 mg capsule  . doxycycline (PERIOSTAT) 20 mg, 2 times daily  . ibuprofen (ADVIL) 200 mg, Oral, Every 6 hours PRN  . icosapent Ethyl (VASCEPA) 2 g, Oral, 2 times daily  . levothyroxine (SYNTHROID) 137 mcg, Oral, Daily before breakfast  . MAGNESIUM GLYCINATE PO 200 mg, Oral, One tablet at bedtime.   Marland Kitchen MAGNESIUM MALATE PO 141 mg, Oral, One tablet every morning.   . meloxicam (MOBIC) 15 mg, Oral, Daily  . naratriptan (AMERGE) 2.5 mg, As needed  . predniSONE (DELTASONE) 5 mg, Oral, Every other day, TAKES IN AM  . progesterone (PROMETRIUM) 125 mg, Oral, Daily at bedtime, COMPOUND MEDICATION IN PILL FORM  . rosuvastatin (CRESTOR) 5 mg, Oral, Weekly, One tablet once a week   . sulfaSALAzine (AZULFIDINE) 1,000 mg, Oral, 2 times daily  . SUMAtriptan (IMITREX) 50 mg, Every 2  hours PRN  . traMADol (ULTRAM) 50 mg, Oral, 2 times daily  . Vitamin D, Ergocalciferol, (DRISDOL) 50000 UNITS CAPS Oral, Every 14 days  . Vitamin K2 100 mcg, Oral, Daily  . zolpidem (AMBIEN) 10 mg, Oral, Daily at bedtime, for sleep   Radiology:   No results found.  Cardiac Studies:   Right and left heart catheterization 10/27/2016: Normal coronary arteries. Normal LV systolic function. Normal LVEDP. Moderate to severe 3+ aortic regurgitation. Mild aortic root dilatation. No evidence of thoracic aortic aneurysm. No evidence of pulmonary hypertension. Normal card output and index. Recommendation: At this point, unless patient continues to symptoms of dyspnea, we'll continue medical therapy. No evidence of pulmonary hypertension. She probably will eventually need aortic valve replacement. Left ventricle is not enlarged or dilated and the EDP is normal hence could continue medical therapy for now. 90 mL contrast utilized.  Echocardiogram 08/30/2019:  Left ventricle cavity is normal in size and wall thickness. Abnormal  septal wall motion due to post-operative valve. Normal LV systolic  function with visual EF 50-55%. Doppler evidence of grade I (impaired)  diastolic dysfunction, normal LAP. Calculated EF 55%.  Bicuspid aortic valve with raphe between right and left coronary cusp. S/p  aortic valve repair. Aortic valve mean gradient of 14 mmHg, Vmax of 2.5  m/s. Calculated aortic valve area by continuity equation is 1 cm. Severe  grade IV aortic regurgitation.  Aortic root measured 3.8 cm. Mild tricuspid regurgitation.  No evidence of pulmonary hypertension.  No significant change compared to previous study on 01/12/2019. Previously LVEF 45-50%. Aortic root 4.2 cm.   EKG  EKG 08/18/2019 normal sinus rhythm at rate of 66 bpm, borderline left atrial abnormality, left axis deviation, left anterior fascicular block.  Incomplete right bundle branch block.  Poor R wave progression, probably normal  variant but cannot exclude anteroseptal infarct old.  No evidence of ischemia.  No significant change from 09/15/2017.   Assessment     ICD-10-CM   1. Severe aortic regurgitation  I35.1 CBC    CMP14+EGFR  2. S/P ascending aortic aneurysm repair  Z98.890    Z86.79   3.  H/O aortic valve repair  Z98.890    Z86.79   4. SBE (subacute bacterial endocarditis) prophylaxis candidate  Z29.8   5. Dyspnea on exertion  R06.00   6. Mixed hyperlipidemia  E78.2      No orders of the defined types were placed in this encounter.   Medications Discontinued During This Encounter  Medication Reason  . CALCIUM & MAGNESIUM CARBONATES PO Patient Preference  . estradiol (VIVELLE-DOT) 0.025 MG/24HR Patient Preference  . Multiple Vitamin (MULTIVITAMIN WITH MINERALS) TABS Patient Preference  . PRESCRIPTION MEDICATION Patient Preference    Recommendations:   Brittany Whitney  is a 64 y.o.  female who presents for a follow-up for Valvular heart disease.  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 Bill Salinas.  On her last office visit about 3 to 4 months ago, I had switched her back to Vascepa due to elevated triglycerides.  She has had lipid profile testing and is awaiting results.  She is also now on Crestor 5 mg daily.  Lipids being managed by PCP.  She has noticed dyspnea on exertion. No clinical evidence of heart failure.  She has been scheduled for aortic valve replacement at [facility] clinic, I will schedule her for right and left heart catheterization.  All questions answered. Schedule for cardiac catheterization.. We discussed regarding risks, benefits, alternatives to this  including stress testing, CTA and continued medical therapy. Patient wants to proceed. Understands <1-2% risk of death, stroke, MI, urgent CABG, bleeding, infection, renal failure but not limited to these.   She is having surgery Aug 31st, 2021 again at [facility] clinic by Dr. Bill Salinas.    Adrian Prows, MD, [facility] 12/04/2019, 12:53 PM Office: [phone]

## 2019-12-07 ENCOUNTER — Inpatient Hospital Stay (HOSPITAL_COMMUNITY): Admission: RE | Admit: 2019-12-07 | Payer: Self-pay | Source: Ambulatory Visit

## 2019-12-08 ENCOUNTER — Other Ambulatory Visit (HOSPITAL_COMMUNITY)
Admission: RE | Admit: 2019-12-08 | Discharge: 2019-12-08 | Disposition: A | Discharge status: Home / Self Care | Payer: Medicare Other | Source: Ambulatory Visit | Attending: Cardiology | Admitting: Cardiology

## 2019-12-08 ENCOUNTER — Other Ambulatory Visit (HOSPITAL_COMMUNITY): Payer: Self-pay

## 2019-12-08 DIAGNOSIS — Z01812 Encounter for preprocedural laboratory examination: Secondary | ICD-10-CM | POA: Insufficient documentation

## 2019-12-08 DIAGNOSIS — Z20822 Contact with and (suspected) exposure to covid-19: Secondary | ICD-10-CM | POA: Diagnosis not present

## 2019-12-08 LAB — CBC
Hematocrit: 40.4 % (ref 34.0–46.6)
Hemoglobin: 12.8 g/dL (ref 11.1–15.9)
MCH: 28.5 pg (ref 26.6–33.0)
MCHC: 31.7 g/dL (ref 31.5–35.7)
MCV: 90 fL (ref 79–97)
Platelets: 251 10*3/uL (ref 150–450)
RBC: 4.49 x10E6/uL (ref 3.77–5.28)
RDW: 14 % (ref 11.7–15.4)
WBC: 6.6 10*3/uL (ref 3.4–10.8)

## 2019-12-08 LAB — CMP14+EGFR
ALT: 17 IU/L (ref 0–32)
AST: 20 IU/L (ref 0–40)
Albumin/Globulin Ratio: 2.1 (ref 1.2–2.2)
Albumin: 4.5 g/dL (ref 3.8–4.8)
Alkaline Phosphatase: 103 IU/L (ref 48–121)
BUN/Creatinine Ratio: 26 (ref 12–28)
BUN: 27 mg/dL (ref 8–27)
Bilirubin Total: 0.4 mg/dL (ref 0.0–1.2)
CO2: 22 mmol/L (ref 20–29)
Calcium: 9.5 mg/dL (ref 8.7–10.3)
Chloride: 104 mmol/L (ref 96–106)
Creatinine, Ser: 1.02 mg/dL — ABNORMAL HIGH (ref 0.57–1.00)
GFR calc Af Amer: 67 mL/min/{1.73_m2} (ref >59)
GFR calc non Af Amer: 58 mL/min/{1.73_m2} — ABNORMAL LOW (ref >59)
Globulin, Total: 2.1 g/dL (ref 1.5–4.5)
Glucose: 86 mg/dL (ref 65–99)
Potassium: 4.2 mmol/L (ref 3.5–5.2)
Sodium: 140 mmol/L (ref 134–144)
Total Protein: 6.6 g/dL (ref 6.0–8.5)

## 2019-12-08 LAB — SARS CORONAVIRUS 2 (TAT 6-24 HRS): SARS Coronavirus 2: NEGATIVE

## 2019-12-12 ENCOUNTER — Encounter (HOSPITAL_COMMUNITY)
Admission: RE | Disposition: A | Discharge status: Home / Self Care | Payer: Self-pay | Source: Home / Self Care | Attending: Cardiology

## 2019-12-12 ENCOUNTER — Ambulatory Visit (HOSPITAL_COMMUNITY)
Admission: RE | Admit: 2019-12-12 | Discharge: 2019-12-12 | Disposition: A | Discharge status: Home / Self Care | Payer: Medicare Other | Attending: Cardiology | Admitting: Cardiology

## 2019-12-12 ENCOUNTER — Other Ambulatory Visit: Payer: Self-pay

## 2019-12-12 DIAGNOSIS — K5010 Crohn's disease of large intestine without complications: Secondary | ICD-10-CM | POA: Insufficient documentation

## 2019-12-12 DIAGNOSIS — R0609 Other forms of dyspnea: Secondary | ICD-10-CM | POA: Diagnosis not present

## 2019-12-12 DIAGNOSIS — Z932 Ileostomy status: Secondary | ICD-10-CM | POA: Diagnosis not present

## 2019-12-12 DIAGNOSIS — E782 Mixed hyperlipidemia: Secondary | ICD-10-CM | POA: Diagnosis not present

## 2019-12-12 DIAGNOSIS — Z7982 Long term (current) use of aspirin: Secondary | ICD-10-CM | POA: Insufficient documentation

## 2019-12-12 DIAGNOSIS — E559 Vitamin D deficiency, unspecified: Secondary | ICD-10-CM | POA: Diagnosis not present

## 2019-12-12 DIAGNOSIS — I351 Nonrheumatic aortic (valve) insufficiency: Secondary | ICD-10-CM | POA: Diagnosis present

## 2019-12-12 DIAGNOSIS — Z880 Allergy status to penicillin: Secondary | ICD-10-CM | POA: Insufficient documentation

## 2019-12-12 DIAGNOSIS — Z79890 Hormone replacement therapy (postmenopausal): Secondary | ICD-10-CM | POA: Insufficient documentation

## 2019-12-12 DIAGNOSIS — Z79899 Other long term (current) drug therapy: Secondary | ICD-10-CM | POA: Diagnosis not present

## 2019-12-12 DIAGNOSIS — E039 Hypothyroidism, unspecified: Secondary | ICD-10-CM | POA: Diagnosis not present

## 2019-12-12 HISTORY — PX: RIGHT/LEFT HEART CATH AND CORONARY ANGIOGRAPHY: CATH118266

## 2019-12-12 HISTORY — PX: AORTIC ARCH ANGIOGRAPHY: CATH118224

## 2019-12-12 LAB — POCT I-STAT EG7
Acid-base deficit: 2 mmol/L (ref 0.0–2.0)
Acid-base deficit: 2 mmol/L (ref 0.0–2.0)
Acid-base deficit: 3 mmol/L — ABNORMAL HIGH (ref 0.0–2.0)
Bicarbonate: 22.2 mmol/L (ref 20.0–28.0)
Bicarbonate: 23.4 mmol/L (ref 20.0–28.0)
Bicarbonate: 23.4 mmol/L (ref 20.0–28.0)
Calcium, Ion: 1.07 mmol/L — ABNORMAL LOW (ref 1.15–1.40)
Calcium, Ion: 1.2 mmol/L (ref 1.15–1.40)
Calcium, Ion: 1.22 mmol/L (ref 1.15–1.40)
HCT: 31 % — ABNORMAL LOW (ref 36.0–46.0)
HCT: 33 % — ABNORMAL LOW (ref 36.0–46.0)
HCT: 34 % — ABNORMAL LOW (ref 36.0–46.0)
Hemoglobin: 10.5 g/dL — ABNORMAL LOW (ref 12.0–15.0)
Hemoglobin: 11.2 g/dL — ABNORMAL LOW (ref 12.0–15.0)
Hemoglobin: 11.6 g/dL — ABNORMAL LOW (ref 12.0–15.0)
O2 Saturation: 74 %
O2 Saturation: 77 %
O2 Saturation: 79 %
Potassium: 3.8 mmol/L (ref 3.5–5.1)
Potassium: 4.2 mmol/L (ref 3.5–5.1)
Potassium: 4.2 mmol/L (ref 3.5–5.1)
Sodium: 138 mmol/L (ref 135–145)
Sodium: 138 mmol/L (ref 135–145)
Sodium: 142 mmol/L (ref 135–145)
TCO2: 23 mmol/L (ref 22–32)
TCO2: 25 mmol/L (ref 22–32)
TCO2: 25 mmol/L (ref 22–32)
pCO2, Ven: 38.4 mmHg — ABNORMAL LOW (ref 44.0–60.0)
pCO2, Ven: 40.6 mmHg — ABNORMAL LOW (ref 44.0–60.0)
pCO2, Ven: 40.8 mmHg — ABNORMAL LOW (ref 44.0–60.0)
pH, Ven: 7.366 (ref 7.250–7.430)
pH, Ven: 7.369 (ref 7.250–7.430)
pH, Ven: 7.37 (ref 7.250–7.430)
pO2, Ven: 41 mmHg (ref 32.0–45.0)
pO2, Ven: 43 mmHg (ref 32.0–45.0)
pO2, Ven: 45 mmHg (ref 32.0–45.0)

## 2019-12-12 LAB — POCT I-STAT 7, (LYTES, BLD GAS, ICA,H+H)
Acid-base deficit: 2 mmol/L (ref 0.0–2.0)
Bicarbonate: 23 mmol/L (ref 20.0–28.0)
Calcium, Ion: 1.19 mmol/L (ref 1.15–1.40)
HCT: 32 % — ABNORMAL LOW (ref 36.0–46.0)
Hemoglobin: 10.9 g/dL — ABNORMAL LOW (ref 12.0–15.0)
O2 Saturation: 99 %
Potassium: 4.1 mmol/L (ref 3.5–5.1)
Sodium: 138 mmol/L (ref 135–145)
TCO2: 24 mmol/L (ref 22–32)
pCO2 arterial: 38.3 mmHg (ref 32.0–48.0)
pH, Arterial: 7.385 (ref 7.350–7.450)
pO2, Arterial: 149 mmHg — ABNORMAL HIGH (ref 83.0–108.0)

## 2019-12-12 SURGERY — RIGHT/LEFT HEART CATH AND CORONARY ANGIOGRAPHY
Anesthesia: LOCAL

## 2019-12-12 MED ORDER — HEPARIN SODIUM (PORCINE) 1000 UNIT/ML IJ SOLN
INTRAMUSCULAR | Status: AC
  Filled 2019-12-12: qty 1

## 2019-12-12 MED ORDER — MIDAZOLAM HCL 2 MG/2ML IJ SOLN
INTRAMUSCULAR | Status: AC
  Filled 2019-12-12: qty 2

## 2019-12-12 MED ORDER — SODIUM CHLORIDE 0.9 % IV SOLN: 250.0000 mL | INTRAVENOUS | Status: DC | PRN

## 2019-12-12 MED ORDER — HEPARIN (PORCINE) IN NACL 1000-0.9 UT/500ML-% IV SOLN
INTRAVENOUS | Status: DC | PRN
  Administered 2019-12-12 (×2): 500 mL

## 2019-12-12 MED ORDER — FENTANYL CITRATE (PF) 100 MCG/2ML IJ SOLN
50.0000 ug | Freq: Once | INTRAMUSCULAR | Status: AC
  Administered 2019-12-12: 50 ug via INTRAVENOUS

## 2019-12-12 MED ORDER — LIDOCAINE HCL (PF) 1 % IJ SOLN
INTRAMUSCULAR | Status: DC | PRN
  Administered 2019-12-12: 4 mL

## 2019-12-12 MED ORDER — SODIUM CHLORIDE 0.9% FLUSH: 3.0000 mL | Freq: Two times a day (BID) | INTRAVENOUS | Status: DC

## 2019-12-12 MED ORDER — SODIUM CHLORIDE 0.9% FLUSH: 3.0000 mL | INTRAVENOUS | Status: DC | PRN

## 2019-12-12 MED ORDER — FENTANYL CITRATE (PF) 100 MCG/2ML IJ SOLN
INTRAMUSCULAR | Status: AC
  Filled 2019-12-12: qty 2

## 2019-12-12 MED ORDER — HEPARIN (PORCINE) IN NACL 1000-0.9 UT/500ML-% IV SOLN
INTRAVENOUS | Status: AC
  Filled 2019-12-12: qty 1000

## 2019-12-12 MED ORDER — VERAPAMIL HCL 2.5 MG/ML IV SOLN
INTRAVENOUS | Status: AC
  Filled 2019-12-12: qty 2

## 2019-12-12 MED ORDER — ACETAMINOPHEN 325 MG PO TABS: 650.0000 mg | ORAL_TABLET | ORAL | Status: DC | PRN

## 2019-12-12 MED ORDER — VERAPAMIL HCL 2.5 MG/ML IV SOLN
INTRAVENOUS | Status: DC | PRN
  Administered 2019-12-12 (×2): 10 mL via INTRA_ARTERIAL

## 2019-12-12 MED ORDER — MIDAZOLAM HCL 2 MG/2ML IJ SOLN
INTRAMUSCULAR | Status: DC | PRN
  Administered 2019-12-12 (×2): 1 mg via INTRAVENOUS

## 2019-12-12 MED ORDER — FENTANYL CITRATE (PF) 100 MCG/2ML IJ SOLN
INTRAMUSCULAR | Status: DC
Start: 2019-12-12 — End: 2019-12-12
  Filled 2019-12-12: qty 2

## 2019-12-12 MED ORDER — FENTANYL CITRATE (PF) 100 MCG/2ML IJ SOLN
INTRAMUSCULAR | Status: DC | PRN
Start: 2019-12-12 — End: 2019-12-12
  Administered 2019-12-12 (×2): 25 ug via INTRAVENOUS

## 2019-12-12 MED ORDER — LIDOCAINE HCL (PF) 1 % IJ SOLN
INTRAMUSCULAR | Status: AC
  Filled 2019-12-12: qty 30

## 2019-12-12 MED ORDER — SODIUM CHLORIDE 0.9 % IV SOLN: INTRAVENOUS | Status: DC

## 2019-12-12 MED ORDER — ASPIRIN 81 MG PO CHEW: 81.0000 mg | CHEWABLE_TABLET | ORAL | Status: AC

## 2019-12-12 MED ORDER — SODIUM CHLORIDE 0.9 % WEIGHT BASED INFUSION
3.0000 mL/kg/h | INTRAVENOUS | Status: AC
  Administered 2019-12-12: 3 mL/kg/h via INTRAVENOUS

## 2019-12-12 MED ORDER — ASPIRIN 81 MG PO CHEW
CHEWABLE_TABLET | ORAL | Status: AC
  Administered 2019-12-12: 81 mg via ORAL
  Filled 2019-12-12: qty 1

## 2019-12-12 MED ORDER — ONDANSETRON HCL 4 MG/2ML IJ SOLN: 4.0000 mg | Freq: Four times a day (QID) | INTRAMUSCULAR | Status: DC | PRN

## 2019-12-12 MED ORDER — SODIUM CHLORIDE 0.9 % WEIGHT BASED INFUSION: 1.0000 mL/kg/h | INTRAVENOUS | Status: DC

## 2019-12-12 MED ORDER — HEPARIN SODIUM (PORCINE) 1000 UNIT/ML IJ SOLN
INTRAMUSCULAR | Status: DC | PRN
  Administered 2019-12-12: 4000 [IU] via INTRAVENOUS

## 2019-12-12 SURGICAL SUPPLY — 14 items
CATH BALLN WEDGE 5F 110CM (CATHETERS) ×1 IMPLANT
CATH INFINITI 5FR ANG PIGTAIL (CATHETERS) ×1 IMPLANT
CATH INFINITI JR4 5F (CATHETERS) ×1 IMPLANT
CATH OPTITORQUE TIG 4.0 5F (CATHETERS) ×1 IMPLANT
DEVICE RAD COMP TR BAND LRG (VASCULAR PRODUCTS) ×1 IMPLANT
GLIDESHEATH SLEND A-KIT 6F 22G (SHEATH) ×1 IMPLANT
GUIDEWIRE .025 260CM (WIRE) ×1 IMPLANT
KIT HEART LEFT (KITS) ×2 IMPLANT
PACK CARDIAC CATHETERIZATION (CUSTOM PROCEDURE TRAY) ×2 IMPLANT
SHEATH GLIDE SLENDER 4/5FR (SHEATH) ×1 IMPLANT
SYR MEDRAD MARK V 150ML (SYRINGE) ×1 IMPLANT
TRANSDUCER W/STOPCOCK (MISCELLANEOUS) ×2 IMPLANT
TUBING CIL FLEX 10 FLL-RA (TUBING) ×2 IMPLANT
WIRE EMERALD 3MM-J .025X260CM (WIRE) ×1 IMPLANT

## 2019-12-12 NOTE — Discharge Instructions (Signed)
Drink plenty of fluid for 48 hours and keep wrist elevated at heart level for 24 hours  Radial Site Care   This sheet gives you information about how to care for yourself after your procedure. Your health care provider may also give you more specific instructions. If you have problems or questions, contact your health care provider. What can I expect after the procedure? After the procedure, it is common to have:  Bruising and tenderness at the catheter insertion area. Follow these instructions at home: Medicines  Take over-the-counter and prescription medicines only as told by your health care provider. Insertion site care 1. Follow instructions from your health care provider about how to take care of your insertion site. Make sure you: ? Wash your hands with soap and water before you change your bandage (dressing). If soap and water are not available, use hand sanitizer. ? remove your dressing as told by your health care provider. In 24 hours 2. Check your insertion site every day for signs of infection. Check for: ? Redness, swelling, or pain. ? Fluid or blood. ? Pus or a bad smell. ? Warmth. 3. Do not take baths, swim, or use a hot tub until your health care provider approves. 4. You may shower 24-48 hours after the procedure, or as directed by your health care provider. ? Remove the dressing and gently wash the site with plain soap and water. ? Pat the area dry with a clean towel. ? Do not rub the site. That could cause bleeding. 5. Do not apply powder or lotion to the site. Activity   1. For 24 hours after the procedure, or as directed by your health care provider: ? Do not flex or bend the affected arm. ? Do not push or pull heavy objects with the affected arm. ? Do not drive yourself home from the hospital or clinic. You may drive 24 hours after the procedure unless your health care provider tells you not to. ? Do not operate machinery or power tools. 2. Do not lift  anything that is heavier than 10 lb (4.5 kg), or the limit that you are told, until your health care provider says that it is safe. For 4 days 3. Ask your health care provider when it is okay to: ? Return to work or school. ? Resume usual physical activities or sports. ? Resume sexual activity. General instructions  If the catheter site starts to bleed, raise your arm and put firm pressure on the site. If the bleeding does not stop, get help right away. This is a medical emergency.  If you went home on the same day as your procedure, a responsible adult should be with you for the first 24 hours after you arrive home.  Keep all follow-up visits as told by your health care provider. This is important. Contact a health care provider if:  You have a fever.  You have redness, swelling, or yellow drainage around your insertion site. Get help right away if:  You have unusual pain at the radial site.  The catheter insertion area swells very fast.  The insertion area is bleeding, and the bleeding does not stop when you hold steady pressure on the area.  Your arm or hand becomes pale, cool, tingly, or numb. These symptoms may represent a serious problem that is an emergency. Do not wait to see if the symptoms will go away. Get medical help right away. Call your local emergency services (911 in the [place] Do not   drive yourself to the hospital. Summary  After the procedure, it is common to have bruising and tenderness at the site.  Follow instructions from your health care provider about how to take care of your radial site wound. Check the wound every day for signs of infection.  Do not lift anything that is heavier than 10 lb (4.5 kg), or the limit that you are told, until your health care provider says that it is safe. This information is not intended to replace advice given to you by your health care provider. Make sure you discuss any questions you have with your health care  provider. Document Revised: 05/12/2017 Document Reviewed: 05/12/2017 Elsevier Patient Education  2020 [employer]  

## 2019-12-12 NOTE — Interval H&P Note (Signed)
History and Physical Interval Note:  12/12/2019 12:00 PM  Brittany Whitney  has presented today for surgery, with the diagnosis of aortic stenosis.  The various methods of treatment have been discussed with the patient and family. After consideration of risks, benefits and other options for treatment, the patient has consented to  Procedure(s): RIGHT/LEFT HEART CATH AND CORONARY ANGIOGRAPHY (N/A) as a surgical intervention.  The patient's history has been reviewed, patient examined, no change in status, stable for surgery.  I have reviewed the patient's chart and labs.  Questions were answered to the patient's satisfaction.     Adrian Prows

## 2019-12-13 ENCOUNTER — Encounter (HOSPITAL_COMMUNITY): Payer: Self-pay | Admitting: Cardiology

## 2020-01-01 ENCOUNTER — Other Ambulatory Visit: Payer: Self-pay | Admitting: Thoracic Surgery

## 2020-01-01 ENCOUNTER — Other Ambulatory Visit: Payer: Self-pay | Admitting: Psychology

## 2020-01-01 ENCOUNTER — Other Ambulatory Visit: Payer: Self-pay | Admitting: Internal Medicine

## 2020-01-01 ENCOUNTER — Ambulatory Visit
Admission: RE | Admit: 2020-01-01 | Discharge: 2020-01-01 | Disposition: A | Discharge status: Home / Self Care | Payer: Medicare Other | Source: Ambulatory Visit | Attending: Thoracic Surgery | Admitting: Thoracic Surgery

## 2020-01-01 ENCOUNTER — Other Ambulatory Visit: Payer: Self-pay

## 2020-01-01 DIAGNOSIS — J90 Pleural effusion, not elsewhere classified: Secondary | ICD-10-CM

## 2020-01-08 ENCOUNTER — Encounter: Payer: Self-pay | Admitting: Cardiology

## 2020-01-08 ENCOUNTER — Other Ambulatory Visit: Payer: Self-pay

## 2020-01-08 ENCOUNTER — Ambulatory Visit: Payer: Medicare Other | Admitting: Cardiology

## 2020-01-08 VITALS — BP 104/69 | HR 118 | Resp 16 | Ht 67.0 in | Wt 114.0 lb

## 2020-01-08 DIAGNOSIS — R0609 Other forms of dyspnea: Secondary | ICD-10-CM

## 2020-01-08 DIAGNOSIS — R0600 Dyspnea, unspecified: Secondary | ICD-10-CM

## 2020-01-08 DIAGNOSIS — Z952 Presence of prosthetic heart valve: Secondary | ICD-10-CM

## 2020-01-08 DIAGNOSIS — Z8679 Personal history of other diseases of the circulatory system: Secondary | ICD-10-CM

## 2020-01-08 DIAGNOSIS — R059 Cough, unspecified: Secondary | ICD-10-CM

## 2020-01-08 DIAGNOSIS — I359 Nonrheumatic aortic valve disorder, unspecified: Secondary | ICD-10-CM

## 2020-01-08 MED ORDER — GUAIFENESIN-CODEINE 200-10 MG/5ML PO LIQD: 10.0000 mL | Freq: Three times a day (TID) | ORAL | 0 refills | Status: DC | PRN

## 2020-01-08 NOTE — Progress Notes (Signed)
Primary Physician/Referring:  Fanny Bien, MD  Patient ID: Brittany Whitney, female    DOB: 1955-08-29, 64 y.o.   MRN: [id number]  Chief Complaint  Patient presents with  . Follow-up    6 month  . Aortic Insuffiency    AV replacement 12/19/2019 at [facility]   HPI:    Brittany Whitney  is a 64 y.o. female who presents for a follow-up for Valvular heart disease.  She is a retired Marine scientist by profession, had bicuspid aortic valve and severe aortic regurgitation, normal coronary arteries by angiography on 10/27/16, familial hypertriglyceridemia, mild chronic dyspnea, psoriatic arthritis, Crohn's disease S/P colectomy and ileostomy placement, underwent aortic valve repair and reduction aortoplasty on 08/07/2017 at [facility].  Due to dyspnea on exertion and worsening aortic regurgitation, she underwent Aortic Valve Replacement (#23 CE Valve), Hemi arch replacement (Hemishield graft #30) at [facility] clinic on 12/19/2019. 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  She has recuperated well she now presents for follow-up.  She reports continued dyspnea on exertion, fatigue, and dizziness.  She has been monitoring her blood pressures at home and noted that her readings have been lower than usual, typically around 100/60-70.  She is currently taking metoprolol succinate 12.5 mg twice daily.  She also notes continued numbness in her right hand for which she is taking gabapentin 300 at night.  She continues to have frequent episodes of non-productive cough, which are very bothersome to her.  Past Medical History:  Diagnosis Date  . Arthritis   . Crohn's colitis ([place])   . Endometrial polyp   . History of exercise stress test 12-25-2016  by dr Einar Gip   no evidence ischemia, exercise tolerence excellent, no significant arrhythmias  . History of malignant melanoma of skin    2005  post excision back area-- localized  . Hypothyroidism   . Ileostomy in  place [facility])    for crohn's colitis -- external pouch  . Migraines   . Severe aortic valve regurgitation cardiologist-  dr Einar Gip--  dx by echo 2017   echo 06/ 10/2016 no change except noted mod. to sev. pulmonry htn/  verified by cardiac cath 07/ 2018 showed no evidence pulmonary htn  . Vitamin D deficiency    Past Surgical History:  Procedure Laterality Date  . ABDOMINAL EXPLORATION SURGERY  x6  1988 (first one) --  1994 (last one)   proctocolectomy w/ ileostomy 1988;  revision 1989, 1991, 1992 internal pouch;  temporary external pouch 1993 then [place]  . AORTIC ARCH ANGIOGRAPHY N/A 12/12/2019   Procedure: AORTIC ARCH ANGIOGRAPHY;  Surgeon: Adrian Prows, MD;  Location: [place] [facility];  Service: Cardiovascular;  Laterality: N/A;  . BREAST BIOPSY Right 1998   benign  . BUNIONECTOMY Right   . CARPAL TUNNEL RELEASE Right 09/2016  . DILATATION & CURETTAGE/HYSTEROSCOPY WITH MYOSURE N/A 01/21/2017   Procedure: DILATATION & CURETTAGE/HYSTEROSCOPY WITH MYOSURE;  Surgeon: Arvella Nigh, MD;  Location: [place];  Service: Gynecology;  Laterality: N/A;  . LIPOMA EXCISION  x2   left shouler area  . MELANOMA EXCISION  2005   back area  . RIGHT/LEFT HEART CATH AND CORONARY ANGIOGRAPHY N/A 10/27/2016   Procedure: Right/Left Heart Cath and Coronary Angiography;  Surgeon: Adrian Prows, MD;  Location: [place] [facility];  Service: Cardiovascular;  Laterality: N/A;  normal coronary arteries, normal LVSF and LVEDP, moderate to severe 3+ aortic regurg.,  no evidence pulmonry  hypertension, normal cardiac output and index  . RIGHT/LEFT HEART CATH AND CORONARY ANGIOGRAPHY N/A 12/12/2019   Procedure: RIGHT/LEFT HEART CATH AND CORONARY ANGIOGRAPHY;  Surgeon: Adrian Prows, MD;  Location: [place] [facility];  Service: Cardiovascular;  Laterality: N/A;  . THORACIC AORTOGRAM N/A 10/27/2016   Procedure: Thoracic Aortogram;  Surgeon: Adrian Prows, MD;  Location: [place] [facility];  Service:  Cardiovascular;  Laterality: N/A;  . TRANSTHORACIC ECHOCARDIOGRAM  10/15/2016   dr Einar Gip   ef 29%, grade 1 diastolic dysfunction/ moderate to severe aortic valve regurg., no evidence of stenosis/  mild to moderate TR/  PASP 81mHg , no evidence pulmonary hypertension  . TUBAL LIGATION Bilateral yrs ago   Family History  Problem Relation Age of Onset  . Breast cancer Mother 639 . Breast cancer Maternal Aunt        after 50  . Breast cancer Paternal Aunt        after 527   Social History   Tobacco Use  . Smoking status: Never Smoker  . Smokeless tobacco: Never Used  Substance Use Topics  . Alcohol use: Yes    Alcohol/week: 7.0 standard drinks    Types: 7 Glasses of wine per week   Marital Status: Married  ROS  Review of Systems  Constitutional: Positive for decreased appetite and malaise/fatigue.  Cardiovascular: Positive for dyspnea on exertion. Negative for chest pain, leg swelling, near-syncope, orthopnea, palpitations, paroxysmal nocturnal dyspnea and syncope.  Respiratory: Positive for cough. Negative for sputum production.   Hematologic/Lymphatic: Negative for bleeding problem. Does not bruise/bleed easily.  Musculoskeletal: Positive for arthritis (Psoriatic arthritis involving bilateral hands).  Neurological: Positive for dizziness. Negative for headaches.       Paresthesias of the right hand.   Objective  Blood pressure 104/69, pulse (!) 118, resp. rate 16, height 5' 7"  (1.702 m), weight 114 lb (51.7 kg), SpO2 96 %.  Vitals with BMI 01/08/2020 12/12/2019 12/12/2019  Height 5' 7"  - -  Weight 114 lbs - -  BMI 152.84- -  Systolic 113297 88  Diastolic 69 58 59  Pulse 144075 77     Physical Exam Cardiovascular:     Rate and Rhythm: Regular rhythm. Tachycardia present.     Pulses: Intact distal pulses.          Radial pulses are 1+ on the right side and 2+ on the left side.     Heart sounds: Murmur heard. High-pitched blowing decrescendo early diastolic murmur is  present with a grade of 3/4 at the upper right sternal border radiating to the apex.  No gallop.      Comments: No leg edema, no JVD. Sternotomy and axillary incisions noted.  Both healing well, without erythema, drainage, or fluctuance.  2 dissolvable stitches noted in sternotomy incision. Brachial artery pulse reduced on right side (1-2+) compared to left (2+).  Pulmonary:     Effort: Pulmonary effort is normal.     Breath sounds: Normal breath sounds. No wheezing, rhonchi or rales.  Abdominal:     General: Bowel sounds are normal.     Palpations: Abdomen is soft.  Skin:    Comments: Bruising noted on left forearm at previous IV site.      Laboratory examination:   Recent Labs    12/07/19 0831 12/12/19 1244 12/12/19 1245 12/12/19 1248 12/12/19 1253  NA 140   < > 138 142 138  K 4.2   < > 4.2 3.8 4.1  CL  104  --   --   --   --   CO2 22  --   --   --   --   GLUCOSE 86  --   --   --   --   BUN 27  --   --   --   --   CREATININE 1.02*  --   --   --   --   CALCIUM 9.5  --   --   --   --   GFRNONAA 58*  --   --   --   --   GFRAA 67  --   --   --   --    < > = values in this interval not displayed.   CrCl cannot be calculated (Patient's most recent lab result is older than the maximum 21 days allowed.).  CMP Latest Ref Rng & Units 12/12/2019 12/12/2019 12/12/2019  Glucose 65 - 99 mg/dL - - -  BUN 8 - 27 mg/dL - - -  Creatinine 0.57 - 1.00 mg/dL - - -  Sodium 135 - 145 mmol/L 138 142 138  Potassium 3.5 - 5.1 mmol/L 4.1 3.8 4.2  Chloride 96 - 106 mmol/L - - -  CO2 20 - 29 mmol/L - - -  Calcium 8.7 - 10.3 mg/dL - - -  Total Protein 6.0 - 8.5 g/dL - - -  Total Bilirubin 0.0 - 1.2 mg/dL - - -  Alkaline Phos 48 - 121 IU/L - - -  AST 0 - 40 IU/L - - -  ALT 0 - 32 IU/L - - -   CBC Latest Ref Rng & Units 12/12/2019 12/12/2019 12/12/2019  WBC 3.4 - 10.8 x10E3/uL - - -  Hemoglobin 12.0 - 15.0 g/dL 10.9(L) 10.5(L) 11.6(L)  Hematocrit 36 - 46 % 32.0(L) 31.0(L) 34.0(L)  Platelets 150 -  450 x10E3/uL - - -   Lipid Panel  No results found for: CHOL, TRIG, HDL, CHOLHDL, VLDL, LDLCALC, LDLDIRECT HEMOGLOBIN A1C No results found for: HGBA1C, MPG TSH No results for input(s): TSH in the last 8760 hours.  External labs:   Cholesterol, total 218.000 M 03/21/2019 HDL 49.000 M 03/21/2019 LDL 117 Triglycerides 262.000 M 03/21/2019  Hemoglobin 12.000 G/ 04/27/2019 Platelets 302.000 X 04/27/2019  Creatinine, Serum 0.950 MG/ 04/27/2019 Potassium 4.000 05/25/2017 ALT (SGPT) 15.000 IU/ 04/27/2019  TSH 2.560 12/30/2018  09/29/2017: WBC 12.03, RBC 4.39, H/H 12.2/38.8, MCV 88.4, Platelets 372. Rest of CBC Normal.  Glucose 106, BUN 26, Creat 0.95, Na/K 134/4.4.   08/09/2017: TSH 1.76 Normal.  Medications and allergies   Allergies  Allergen Reactions  . Flagyl [Metronidazole] Other (See Comments)    Numbness in legs and arms  . Penicillins Hives  . Ceclor [Cefaclor] Rash     Current Outpatient Medications  Medication Instructions  . acetaminophen (TYLENOL) 500 mg, Oral  . ALPRAZolam (XANAX) 0.25 mg, Oral, At bedtime PRN  . aspirin EC 81 mg, Oral, Daily, Swallow whole.   . Biotin 5000 MCG TABS 1 tablet, Oral, Daily  . Bismuth Subgallate (DEVROM) 200 MG CHEW 1 tablet, Oral, 2 times daily  . Cholecalciferol 5,000 Units, 2 times daily  . cyclobenzaprine (FLEXERIL) 5 mg, Oral, Daily, One tablet at bedtime.   Marland Kitchen DHEA 10 MG CAPS 1 capsule, Oral, BH-each morning  . Diindolylmethane POWD 1 capsule, Oral, 2 times daily, 100 mg capsule  . doxycycline (PERIOSTAT) 20 mg, 2 times daily  . guaiFENesin-Codeine 200-10 MG/5ML LIQD 10 mLs, Oral, 3 times  daily PRN  . ibuprofen (ADVIL) 200 mg, Oral, Every 6 hours PRN  . icosapent Ethyl (VASCEPA) 2 g, Oral, 2 times daily  . levothyroxine (SYNTHROID) 137 mcg, Oral, Daily before breakfast  . MAGNESIUM GLYCINATE PO 200 mg, Oral, One tablet at bedtime.   Marland Kitchen MAGNESIUM MALATE PO 141 mg, Oral, One tablet every morning.   . meloxicam (MOBIC) 15 mg, Oral, Daily   . metoprolol succinate (TOPROL-XL) 12.5 mg, Oral, Daily, 1/2 tablet twice a day.   . naratriptan (AMERGE) 2.5 mg, As needed  . predniSONE (DELTASONE) 5 mg, Oral, Every other day, TAKES IN AM  . progesterone (PROMETRIUM) 125 mg, Oral, Daily at bedtime, COMPOUND MEDICATION IN PILL FORM  . rosuvastatin (CRESTOR) 5 mg, Oral, Weekly, One tablet once a week   . sulfaSALAzine (AZULFIDINE) 1,000 mg, Oral, 2 times daily  . SUMAtriptan (IMITREX) 50 mg, Every 2 hours PRN  . traMADol (ULTRAM) 50 mg, Oral, 2 times daily  . Vitamin D, Ergocalciferol, (DRISDOL) 50000 UNITS CAPS Oral, Every 14 days  . Vitamin K2 100 mcg, Oral, Daily  . zolpidem (AMBIEN) 10 mg, Oral, Daily at bedtime, for sleep   Radiology:   No results found.  Chest X-Ray 01/01/2020:  1. Postoperative changes with resolving areas of subsegmental atelectasis and trace bilateral pleural effusions, as above. Previously noted small left-sided pneumothorax has resolved. On the lateral view there is a probable small volume of postoperative fluid posterior to the inferior aspect of the sternum. Attention to this area on follow-up studies is recommended to ensure resolution.  CT Chest/Abdomen/Pelvis 12/19/2019: [facility]  1. The patient has undergone interval AVR using a bioprosthesis, as well  as supracoronary ascending aortic grafting. There are no findings to  suggest anastomotic stenosis, infection, or leak. There are the typical  postoperative changes present. Overall, the findings are appropriate for  the recent postoperative state. Beyond that, normal thoracic aorta. No  acute aortic pathology identified.  2. Normal abdominal aorta. No acute abdominal aortic pathology.  3. Stable appearing changes consistent with left-sided ileostomy.  4. Gallstones noted within the gallbladder.   Cardiac Studies:   Right and left heart catheterization 10/27/2016: Normal coronary arteries. Normal LV systolic function. Normal LVEDP. Moderate to  severe 3+ aortic regurgitation. Mild aortic root dilatation. No evidence of thoracic aortic aneurysm. No evidence of pulmonary hypertension. Normal card output and index. Recommendation: At this point, unless patient continues to symptoms of dyspnea, we'll continue medical therapy. No evidence of pulmonary hypertension. She probably will eventually need aortic valve replacement. Left ventricle is not enlarged or dilated and the EDP is normal hence could continue medical therapy for now. 90 mL contrast utilized.  Echocardiogram 08/30/2019:  Left ventricle cavity is normal in size and wall thickness. Abnormal  septal wall motion due to post-operative valve. Normal LV systolic  function with visual EF 50-55%. Doppler evidence of grade I (impaired)  diastolic dysfunction, normal LAP. Calculated EF 55%.  Bicuspid aortic valve with raphe between right and left coronary cusp. S/p  aortic valve repair. Aortic valve mean gradient of 14 mmHg, Vmax of 2.5  m/s. Calculated aortic valve area by continuity equation is 1 cm. Severe  grade IV aortic regurgitation.  Aortic root measured 3.8 cm. Mild tricuspid regurgitation.  No evidence of pulmonary hypertension.  No significant change compared to previous study on 01/12/2019. Previously LVEF 45-50%. Aortic root 4.2 cm.   Right and left heart catheterization and aortic root angiogram 12/12/19: Normal coronary arteriogram, codominant system.  Normal  LV systolic function, EF 21%.  Severe aortic regurgitation.  Diffuse aortic root and ascending aorta and arch dilatation suggestive of arteriomegaly.  No focal aneurysmal dilatation.  EKG  EKG 01/08/2020: Sinus tachycardia at rate of 117 bpm, left atrial enlargement, left axis deviation, left anterior fascicular block.  Right bundle branch block.  Poor R wave progression, cannot exclude anteroseptal infarct old.  Borderline treated for LVH.  Compared to 08/18/2019, sinus tachycardia.   Assessment     ICD-10-CM   1.  Aortic valve disorder  I35.9 EKG 12-Lead    CBC    Basic metabolic panel    AMB referral to cardiac rehabilitation  2. S/P AVR (aortic valve replacement) and aortoplasty (#23 CE Valve), Hemi arch replacement (Hemishield graft #30)  12/19/2019  Z95.2 AMB referral to cardiac rehabilitation  3. S/P ascending aortic aneurysm repair  Z98.890 AMB referral to cardiac rehabilitation   Z86.79   4. Dyspnea on exertion  R06.00 AMB referral to cardiac rehabilitation  5. Cough  R05 guaiFENesin-Codeine 200-10 MG/5ML LIQD     Meds ordered this encounter  Medications  . guaiFENesin-Codeine 200-10 MG/5ML LIQD    Sig: Take 10 mLs by mouth 3 (three) times daily as needed.    Dispense:  210 mL    Refill:  0    There are no discontinued medications.  Recommendations:   PASHA GADISON  is a 64 y.o. female who presents for a follow-up for Valvular heart disease.  She is a retired Marine scientist by profession, had bicuspid aortic valve and severe aortic regurgitation, normal coronary arteries by angiography on 10/27/16, familial hypertriglyceridemia, mild chronic dyspnea, psoriatic arthritis, Crohn's disease S/P colectomy and ileostomy placement, underwent aortic valve repair and reduction aortoplasty on 08/07/2017 at [facility]  by Dr. Bill Salinas.  Due to dyspnea on exertion and worsening aortic regurgitation, she underwent Aortic Valve Replacement (#23 CE Valve), Hemi arch replacement (Hemishield graft #30) at [facility] clinic on 12/19/2019. 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  Patient's primary concerns today include fatigue, dyspnea on exertion, and dizziness. Discussed with patient these symptoms are to be expected at this point in her recovery after valvular surgery. Reassured her that symptoms will continue to improve over the next several weeks. She has been experiencing symptomatic hypotension. Encouraged her to increase salt intake and to monitor blood  pressure regularly at home. If blood pressure in left arm is less than 308 mmHg systolic recommend holding metoprolol. Otherwise will continue metoprolol succinate 12.5 mg twice daily. Also instructed patient to increase calorie intake in order to maintain weight.   Upon exam noted reduced brachial pulse in right arm compared to left arm. Blood pressure in left arm is also greater than right. Suspect brachial artery injury, as surgeon at [facility] clinic attempted to use axillary access. Discussed with patient this may heal over time, will continue to monitor. 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 Will continue to monitor, if persists may consider neurology referral.   Reviewed labs, patient's hemoglobin on 12/12/2019 was 10.9. Anemia may be contributing to patient's fatigue. Will obtain CBC and continue to monitor. Patient's cough is very bothersome to her, suspect is likely secondary to intubation during surgery. Discussed with patient this is likely to improve over time, but will also send prescription for cough  syrup for symptomatic relief. Encouraged patient to rest as needed, but to also start walking on a regular basis as tolerated.  Sent referral for cardiac rehab, and encouraged patient to start this in 2 to 3 weeks.  Of note patient is concerned about 2 sutures which she can see at the inferior aspect of her sternotomy incision.  Upon examination, the sutures are noted to be of resorbable material.  Reassured patient they should dissolve over the next couple weeks.  However if they do not they, then sutures can be removed in our office at follow-up appointment.  Follow-up in 4 weeks for post aortic valve repair surgery. Will obtain labs (CBC, BMP) prior to follow up visit.   Patient was seen in collaboration with Dr. Einar Gip. He also reviewed patient's  chart and examined the patient. Dr. Einar Gip is in agreement of the plan.    Alethia Berthold, PA-C 01/08/2020, 5:08 PM Office: [phone]

## 2020-01-08 NOTE — Telephone Encounter (Signed)
From pt

## 2020-01-10 ENCOUNTER — Telehealth: Payer: Self-pay

## 2020-01-10 ENCOUNTER — Telehealth: Payer: Self-pay | Admitting: Cardiology

## 2020-01-10 ENCOUNTER — Other Ambulatory Visit: Payer: Self-pay | Admitting: Cardiology

## 2020-01-10 DIAGNOSIS — R059 Cough, unspecified: Secondary | ICD-10-CM

## 2020-01-10 LAB — BASIC METABOLIC PANEL
BUN/Creatinine Ratio: 26 (ref 12–28)
BUN: 28 mg/dL — ABNORMAL HIGH (ref 8–27)
CO2: 20 mmol/L (ref 20–29)
Calcium: 11.2 mg/dL — ABNORMAL HIGH (ref 8.7–10.3)
Chloride: 99 mmol/L (ref 96–106)
Creatinine, Ser: 1.06 mg/dL — ABNORMAL HIGH (ref 0.57–1.00)
GFR calc Af Amer: 64 mL/min/{1.73_m2} (ref >59)
GFR calc non Af Amer: 55 mL/min/{1.73_m2} — ABNORMAL LOW (ref >59)
Glucose: 98 mg/dL (ref 65–99)
Potassium: 4.2 mmol/L (ref 3.5–5.2)
Sodium: 137 mmol/L (ref 134–144)

## 2020-01-10 LAB — CBC
Hematocrit: 45 % (ref 34.0–46.6)
Hemoglobin: 14.9 g/dL (ref 11.1–15.9)
MCH: 29 pg (ref 26.6–33.0)
MCHC: 33.1 g/dL (ref 31.5–35.7)
MCV: 88 fL (ref 79–97)
Platelets: 472 10*3/uL — ABNORMAL HIGH (ref 150–450)
RBC: 5.13 x10E6/uL (ref 3.77–5.28)
RDW: 13.9 % (ref 11.7–15.4)
WBC: 9.3 10*3/uL (ref 3.4–10.8)

## 2020-01-10 MED ORDER — GUAIFENESIN-CODEINE 100-10 MG/5ML PO SOLN: 10.0000 mL | Freq: Four times a day (QID) | ORAL | 1 refills | Status: DC | PRN

## 2020-01-10 NOTE — Telephone Encounter (Signed)
Guaifenesin 200- 10/ 5 ml  is not available pharmacy is asking for 100-10 to be sent in.

## 2020-01-10 NOTE — Telephone Encounter (Signed)
From patient.

## 2020-01-10 NOTE — Telephone Encounter (Signed)
Patient called to have her cough medicine guaifenesin sent into [facility] on [address] in [place] because [facility] is not getting the medication in stock . Please send in medications when able to thank you .

## 2020-01-16 ENCOUNTER — Telehealth (HOSPITAL_COMMUNITY): Payer: Self-pay

## 2020-01-16 ENCOUNTER — Telehealth (HOSPITAL_COMMUNITY): Payer: Self-pay | Admitting: *Deleted

## 2020-01-16 NOTE — Telephone Encounter (Signed)
-----   Message from Alethia Berthold, [place] sent at 01/15/2020  5:10 PM EDT ----- Regarding: RE: BP and HR parameters for cardiac rehab Thank you for reaching out. Acceptable resting HR can be 130 bpm and target HR during exercise 052 bpm. Systolic 90 asymptomatic is acceptable BP parameter. Thank you!  ----- Message ----- From: Rowe Pavy, RN Sent: 01/12/2020   9:07 AM EDT To: Alethia Berthold, PA-C Subject: BP and HR parameters for cardiac rehab           Good Day,  Reviewed the above pt office progress note. Do to pt sinus tachycardia and hypotension, would like to have parameters for BP and HR for cardiac rehab.  Noted that her ekg showed ST 117 and bp parameters to hold metoprolol succinate for BP less than 100.   Typically for a resting HR we have the pt sit until HR is less than 100 unless directed otherwise by the provider.  What is an acceptable resting HR for this pt?  We use 80% max Heart rate in Cardiac rehab.  For a 64 year old pt the Target heart rate is 124.  Pt workloads will be adjusted to maintain heart rate 124 or less during exercise unless directed by the provider. What is an acceptable Target Heart rate for this pt during exercise?  Bp parameter at rest, typically systolic 90 asymptomatic is acceptable.  Is this ok for this pt? Or higher reading is preferred.  Will place Left arm BP on her nametag due to the differing BP from the Right and Left arm.   Thanks so much for your advisement, we are looking forward to working with Mickel Baas again.  Cherre Huger, BSN Cardiac and Training and development officer

## 2020-01-16 NOTE — Telephone Encounter (Signed)
Called patient to see if she was interested in participating in the Cardiac Rehab Program. Patient stated yes. Patient will come in for orientation on 03/05/20 @ 930AM and will attend the 915AM exercise class. Went over insurance, patient verbalized understanding.   Mailed letter.

## 2020-01-16 NOTE — Telephone Encounter (Signed)
Pt insurance is active and benefits verified through Medicare A/B. Co-pay $0.00, DED $203.00/$203.00 met, out of pocket $0.00/$0.00 met, co-insurance 20%. No pre-authorization required. Passport, 01/16/20 @ 3:01PM, REF#[id number]  2ndary insurance is active and benefits verified through [employer].Marland Kitchen Co-pay $0.00, DED $0.00/$0.00 met, out of pocket $0.00/$0.00 met, co-insurance 0%. No pre-authorization required.

## 2020-02-06 ENCOUNTER — Telehealth: Payer: Self-pay

## 2020-02-06 NOTE — Telephone Encounter (Signed)
Error

## 2020-02-07 ENCOUNTER — Encounter: Payer: Self-pay | Admitting: Cardiology

## 2020-02-07 ENCOUNTER — Other Ambulatory Visit: Payer: Self-pay

## 2020-02-07 ENCOUNTER — Ambulatory Visit: Payer: Medicare Other | Admitting: Cardiology

## 2020-02-07 VITALS — BP 98/70 | HR 106 | Resp 15 | Ht 66.0 in | Wt 114.0 lb

## 2020-02-07 DIAGNOSIS — Z8679 Personal history of other diseases of the circulatory system: Secondary | ICD-10-CM

## 2020-02-07 DIAGNOSIS — I359 Nonrheumatic aortic valve disorder, unspecified: Secondary | ICD-10-CM

## 2020-02-07 DIAGNOSIS — Z952 Presence of prosthetic heart valve: Secondary | ICD-10-CM

## 2020-02-07 DIAGNOSIS — R059 Cough, unspecified: Secondary | ICD-10-CM

## 2020-02-07 DIAGNOSIS — Z9889 Other specified postprocedural states: Secondary | ICD-10-CM

## 2020-02-07 MED ORDER — BENZONATATE 100 MG PO CAPS: 100.0000 mg | ORAL_CAPSULE | Freq: Three times a day (TID) | ORAL | 0 refills | Status: DC | PRN

## 2020-02-07 MED ORDER — DILTIAZEM HCL ER COATED BEADS 120 MG PO CP24: 120.0000 mg | ORAL_CAPSULE | Freq: Every day | ORAL | 3 refills | Status: DC

## 2020-02-07 NOTE — Progress Notes (Signed)
Primary Physician/Referring:  Fanny Bien, MD  Patient ID: Brittany Whitney, female    DOB: 05-26-1955, 64 y.o.   MRN: [id number]  Chief Complaint  Patient presents with   Follow-up    4 week   AV replacement   HPI:    Brittany Whitney  is a 64 y.o. female who presents for a follow-up for Valvular heart disease.  She is a retired Marine scientist by profession, had bicuspid aortic valve and severe aortic regurgitation, normal coronary arteries by angiography on 10/27/16, familial hypertriglyceridemia, mild chronic dyspnea, psoriatic arthritis, Crohn's disease S/P colectomy and ileostomy placement, underwent aortic valve repair and reduction aortoplasty on 08/07/2017 at [facility].  Due to dyspnea on exertion and worsening aortic regurgitation, she underwent Aortic Valve Replacement (#23 CE Valve), Hemi arch replacement (Hemishield graft #30) at [facility] clinic on 12/19/2019. 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  Patient presents for follow-up after undergoing aortic valve replacement, hemiarch replacement 12/19/2019.  Since her last visit patient has stopped taking metoprolol succinate due to fatigue and dizziness.  Otherwise she continues to recuperate well.  Symptoms of dyspnea on exertion, fatigue, and dizziness have all improved since last visit.  Patient has slowly been increasing activity as tolerated, will begin cardiac rehab next month.  She reports numbness in her right hand has resolved, and she is no longer taking gabapentin 3 mg nightly.  She states she is currently doing her best to eat healthy high-calorie foods in order to gain some weight. She does continue to have episodes of nonproductive cough, for which she takes [employer] with significant improvement.  She is concerned today due to exposed suture at incision site just below the right clavicle.  Past Medical History:  Diagnosis Date   Arthritis    Crohn's colitis ([place])     Endometrial polyp    History of exercise stress test 12-25-2016  by dr Einar Gip   no evidence ischemia, exercise tolerence excellent, no significant arrhythmias   History of malignant melanoma of skin    2005  post excision back area-- localized   Hypothyroidism    Ileostomy in place [facility])    for crohn's colitis -- external pouch   Migraines    Severe aortic valve regurgitation cardiologist-  dr Einar Gip--  dx by echo 2017   echo 06/ 10/2016 no change except noted mod. to sev. pulmonry htn/  verified by cardiac cath 07/ 2018 showed no evidence pulmonary htn   Vitamin D deficiency    Past Surgical History:  Procedure Laterality Date   ABDOMINAL EXPLORATION SURGERY  x6  1988 (first one) --  1994 (last one)   proctocolectomy w/ ileostomy 1988;  revision 1989, 1991, 1992 internal pouch;  temporary external pouch 1993 then [place] 12/12/2019   Procedure: AORTIC ARCH ANGIOGRAPHY;  Surgeon: Adrian Prows, MD;  Location: [place] [facility];  Service: Cardiovascular;  Laterality: N/A;   BREAST BIOPSY Right 1998   benign   BUNIONECTOMY Right    CARPAL TUNNEL RELEASE Right 09/2016   DILATATION & CURETTAGE/HYSTEROSCOPY WITH MYOSURE N/A 01/21/2017   Procedure: DILATATION & CURETTAGE/HYSTEROSCOPY WITH MYOSURE;  Surgeon: Arvella Nigh, MD;  Location: [place];  Service: Gynecology;  Laterality: N/A;   LIPOMA EXCISION  x2   left shouler area   MELANOMA EXCISION  2005   back area   RIGHT/LEFT HEART CATH AND CORONARY ANGIOGRAPHY N/A 10/27/2016  Procedure: Right/Left Heart Cath and Coronary Angiography;  Surgeon: Adrian Prows, MD;  Location: [place] [facility];  Service: Cardiovascular;  Laterality: N/A;  normal coronary arteries, normal LVSF and LVEDP, moderate to severe 3+ aortic regurg.,  no evidence pulmonry hypertension, normal cardiac output and index   RIGHT/LEFT HEART CATH AND CORONARY ANGIOGRAPHY N/A 12/12/2019   Procedure: RIGHT/LEFT  HEART CATH AND CORONARY ANGIOGRAPHY;  Surgeon: Adrian Prows, MD;  Location: [place] [facility];  Service: Cardiovascular;  Laterality: N/A;   THORACIC AORTOGRAM N/A 10/27/2016   Procedure: Thoracic Aortogram;  Surgeon: Adrian Prows, MD;  Location: [place] [facility];  Service: Cardiovascular;  Laterality: N/A;   TRANSTHORACIC ECHOCARDIOGRAM  10/15/2016   dr Einar Gip   ef 02%, grade 1 diastolic dysfunction/ moderate to severe aortic valve regurg., no evidence of stenosis/  mild to moderate TR/  PASP 46mHg , no evidence pulmonary hypertension   TUBAL LIGATION Bilateral yrs ago   Family History  Problem Relation Age of Onset   Breast cancer Mother 660  Breast cancer Maternal Aunt        after 533  Breast cancer Paternal Aunt        after 541   Social History   Tobacco Use   Smoking status: Never Smoker   Smokeless tobacco: Never Used  Substance Use Topics   Alcohol use: Yes    Alcohol/week: 7.0 standard drinks    Types: 7 Glasses of wine per week   Marital Status: Married  ROS  Review of Systems  Constitutional: Positive for malaise/fatigue (improving).  Cardiovascular: Positive for dyspnea on exertion (improving). Negative for chest pain, leg swelling, near-syncope, orthopnea, palpitations, paroxysmal nocturnal dyspnea and syncope.  Respiratory: Positive for cough. Negative for sputum production.   Hematologic/Lymphatic: Negative for bleeding problem. Does not bruise/bleed easily.  Musculoskeletal: Positive for arthritis (Psoriatic arthritis involving bilateral hands).  Neurological: Positive for dizziness (improving). Negative for headaches.   Objective  Blood pressure 98/70, pulse (!) 106, resp. rate 15, height 5' 6"  (1.676 m), weight 114 lb (51.7 kg), SpO2 97 %.  Vitals with BMI 02/07/2020 02/07/2020 01/08/2020  Height - 5' 6"  5' 7"   Weight - 114 lbs 114 lbs  BMI - 163.78158.85 Systolic 98 [id number] Diastolic 70 67 69  Pulse - 106 118     Physical Exam Cardiovascular:      Rate and Rhythm: Regular rhythm. Tachycardia present.     Pulses: Intact distal pulses.          Radial pulses are 1+ on the right side and 2+ on the left side.     Heart sounds: Murmur heard. High-pitched blowing decrescendo early diastolic murmur is present with a grade of 3/4 at the upper right sternal border radiating to the apex.  No gallop.      Comments: No leg edema, no JVD. Sternotomy and axillary incisions noted. Both healing well, without drainage, or fluctuance.   Single dissolvable suture noted in axillary incision.  Small superficial palpable cord noted at axillary incision.  Brachial artery pulse reduced on right side (1-2+) compared to left (2+).  Pulmonary:     Effort: Pulmonary effort is normal.     Breath sounds: Normal breath sounds. No wheezing, rhonchi or rales.  Abdominal:     General: Bowel sounds are normal.     Palpations: Abdomen is soft.    Laboratory examination:   Recent Labs    12/07/19 0831 12/12/19 1244 12/12/19 1248 12/12/19  1253 01/09/20 1047  NA 140   < > 142 138 137  K 4.2   < > 3.8 4.1 4.2  CL 104  --   --   --  99  CO2 22  --   --   --  20  GLUCOSE 86  --   --   --  98  BUN 27  --   --   --  28*  CREATININE 1.02*  --   --   --  1.06*  CALCIUM 9.5  --   --   --  11.2*  GFRNONAA 58*  --   --   --  55*  GFRAA 67  --   --   --  64   < > = values in this interval not displayed.   CrCl cannot be calculated (Patient's most recent lab result is older than the maximum 21 days allowed.).  CMP Latest Ref Rng & Units 01/09/2020 12/12/2019 12/12/2019  Glucose 65 - 99 mg/dL 98 - -  BUN 8 - 27 mg/dL 28(H) - -  Creatinine 0.57 - 1.00 mg/dL 1.06(H) - -  Sodium 134 - 144 mmol/L 137 138 142  Potassium 3.5 - 5.2 mmol/L 4.2 4.1 3.8  Chloride 96 - 106 mmol/L 99 - -  CO2 20 - 29 mmol/L 20 - -  Calcium 8.7 - 10.3 mg/dL 11.2(H) - -  Total Protein 6.0 - 8.5 g/dL - - -  Total Bilirubin 0.0 - 1.2 mg/dL - - -  Alkaline Phos 48 - 121 IU/L - - -  AST 0 -  40 IU/L - - -  ALT 0 - 32 IU/L - - -   CBC Latest Ref Rng & Units 01/09/2020 12/12/2019 12/12/2019  WBC 3.4 - 10.8 x10E3/uL 9.3 - -  Hemoglobin 11.1 - 15.9 g/dL 14.9 10.9(L) 10.5(L)  Hematocrit 34.0 - 46.6 % 45.0 32.0(L) 31.0(L)  Platelets 150 - 450 x10E3/uL 472(H) - -   Lipid Panel  No results found for: CHOL, TRIG, HDL, CHOLHDL, VLDL, LDLCALC, LDLDIRECT HEMOGLOBIN A1C No results found for: HGBA1C, MPG TSH No results for input(s): TSH in the last 8760 hours.  External labs:   Cholesterol, total 218.000 M 03/21/2019 HDL 49.000 M 03/21/2019 LDL 117 Triglycerides 262.000 M 03/21/2019  Hemoglobin 12.000 G/ 04/27/2019 Platelets 302.000 X 04/27/2019  Creatinine, Serum 0.950 MG/ 04/27/2019 Potassium 4.000 05/25/2017 ALT (SGPT) 15.000 IU/ 04/27/2019  TSH 2.560 12/30/2018  09/29/2017: WBC 12.03, RBC 4.39, H/H 12.2/38.8, MCV 88.4, Platelets 372. Rest of CBC Normal.  Glucose 106, BUN 26, Creat 0.95, Na/K 134/4.4.   08/09/2017: TSH 1.76 Normal.  Medications and allergies   Allergies  Allergen Reactions   Flagyl [Metronidazole] Other (See Comments)    Numbness in legs and arms   Penicillins Hives   Ceclor [Cefaclor] Rash     Current Outpatient Medications  Medication Instructions   acetaminophen (TYLENOL) 500 mg, Oral   ALPRAZolam (XANAX) 0.25 mg, Oral, At bedtime PRN   Amerge 2.5 mg, Oral, As needed, Take one (1) tablet at onset of headache; if returns or does not resolve, may repeat after 4 hours; do not exceed five (5) mg in 24 hours.   aspirin EC 81 mg, Oral, Daily, Swallow whole.   benzonatate (TESSALON PERLES) 100 mg, Oral, 3 times daily PRN   Biotin 5000 MCG TABS 1 tablet, Oral, Daily   Bismuth Subgallate (DEVROM) 200 MG CHEW 1 tablet, Oral, 2 times daily   cyclobenzaprine (FLEXERIL) 5 mg, Oral, Daily, One  tablet at bedtime.   DHEA 10 MG CAPS 1 capsule, Oral, BH-each morning   Diindolylmethane POWD 1 capsule, Oral, 2 times daily, 100 mg capsule   diltiazem (CARDIZEM  CD) 120 mg, Oral, Daily   doxycycline (PERIOSTAT) 20 mg, Oral, 2 times daily   guaiFENesin-codeine 100-10 MG/5ML syrup 10 mLs, Oral, Every 6 hours PRN   ibuprofen (ADVIL) 200 mg, Oral, Every 6 hours PRN   icosapent Ethyl (VASCEPA) 2 g, Oral, 2 times daily   levothyroxine (SYNTHROID) 137 mcg, Oral, Daily before breakfast   MAGNESIUM GLYCINATE PO 200 mg, Oral, One tablet at bedtime.   MAGNESIUM MALATE PO 141 mg, Oral, One tablet every morning.   meloxicam (MOBIC) 15 mg, Oral, Daily   predniSONE (DELTASONE) 5 mg, Oral, Every other day, TAKES IN AM   progesterone (PROMETRIUM) 125 mg, Oral, Daily at bedtime, COMPOUND MEDICATION IN PILL FORM   rosuvastatin (CRESTOR) 5 mg, Oral, Weekly, One tablet once a week   sulfaSALAzine (AZULFIDINE) 1,000 mg, Oral, 2 times daily   SUMAtriptan (IMITREX) 50 mg, Oral, Every 2 hours PRN   traMADol (ULTRAM) 50 mg, Oral, 2 times daily   Vitamin D, Ergocalciferol, (DRISDOL) 50000 UNITS CAPS Oral, Every 14 days   Vitamin K2 100 mcg, Oral, Daily   zolpidem (AMBIEN) 10 mg, Oral, Daily at bedtime, for sleep   Radiology:   No results found.  Chest X-Ray 01/01/2020:  1. Postoperative changes with resolving areas of subsegmental atelectasis and trace bilateral pleural effusions, as above. Previously noted small left-sided pneumothorax has resolved. On the lateral view there is a probable small volume of postoperative fluid posterior to the inferior aspect of the sternum. Attention to this area on follow-up studies is recommended to ensure resolution.  CT Chest/Abdomen/Pelvis 12/19/2019: [facility]  1. The patient has undergone interval AVR using a bioprosthesis, as well  as supracoronary ascending aortic grafting. There are no findings to  suggest anastomotic stenosis, infection, or leak. There are the typical  postoperative changes present. Overall, the findings are appropriate for  the recent postoperative state. Beyond that, normal thoracic  aorta. No  acute aortic pathology identified.  2. Normal abdominal aorta. No acute abdominal aortic pathology.  3. Stable appearing changes consistent with left-sided ileostomy.  4. Gallstones noted within the gallbladder.   Cardiac Studies:   Right and left heart catheterization 10/27/2016: Normal coronary arteries. Normal LV systolic function. Normal LVEDP. Moderate to severe 3+ aortic regurgitation. Mild aortic root dilatation. No evidence of thoracic aortic aneurysm. No evidence of pulmonary hypertension. Normal card output and index. Recommendation: At this point, unless patient continues to symptoms of dyspnea, we'll continue medical therapy. No evidence of pulmonary hypertension. She probably will eventually need aortic valve replacement. Left ventricle is not enlarged or dilated and the EDP is normal hence could continue medical therapy for now. 90 mL contrast utilized.  Echocardiogram 08/30/2019:  Left ventricle cavity is normal in size and wall thickness. Abnormal  septal wall motion due to post-operative valve. Normal LV systolic  function with visual EF 50-55%. Doppler evidence of grade I (impaired)  diastolic dysfunction, normal LAP. Calculated EF 55%.  Bicuspid aortic valve with raphe between right and left coronary cusp. S/p  aortic valve repair. Aortic valve mean gradient of 14 mmHg, Vmax of 2.5  m/s. Calculated aortic valve area by continuity equation is 1 cm. Severe  grade IV aortic regurgitation.  Aortic root measured 3.8 cm. Mild tricuspid regurgitation.  No evidence of pulmonary hypertension.  No significant change  compared to previous study on 01/12/2019. Previously LVEF 45-50%. Aortic root 4.2 cm.   Right and left heart catheterization and aortic root angiogram 12/12/19: Normal coronary arteriogram, codominant system.  Normal LV systolic function, EF 93%.  Severe aortic regurgitation.  Diffuse aortic root and ascending aorta and arch dilatation suggestive of arteriomegaly.   No focal aneurysmal dilatation.  EKG   EKG 01/08/2020: Sinus tachycardia at rate of 117 bpm, left atrial enlargement, left axis deviation, left anterior fascicular block.  Right bundle branch block.  Poor R wave progression, cannot exclude anteroseptal infarct old.  Borderline treated for LVH.  Compared to 08/18/2019, sinus tachycardia.   Assessment     ICD-10-CM   1. S/P AVR (aortic valve replacement) and aortoplasty  Z95.2 benzonatate (TESSALON PERLES) 100 MG capsule    diltiazem (CARDIZEM CD) 120 MG 24 hr capsule  2. S/P ascending aortic aneurysm repair  Z98.890    Z86.79   3. Aortic valve disorder  I35.9 diltiazem (CARDIZEM CD) 120 MG 24 hr capsule  4. Cough  R05.9 benzonatate (TESSALON PERLES) 100 MG capsule    diltiazem (CARDIZEM CD) 120 MG 24 hr capsule     Meds ordered this encounter  Medications   benzonatate (TESSALON PERLES) 100 MG capsule    Sig: Take 1 capsule (100 mg total) by mouth 3 (three) times daily as needed for cough.    Dispense:  20 capsule    Refill:  0   diltiazem (CARDIZEM CD) 120 MG 24 hr capsule    Sig: Take 1 capsule (120 mg total) by mouth daily.    Dispense:  30 capsule    Refill:  3    Medications Discontinued During This Encounter  Medication Reason   metoprolol succinate (TOPROL-XL) 25 MG 24 hr tablet Side effect (s)    Recommendations:   ARLAYNE LIGGINS  is a 64 y.o. female who presents for a follow-up for Valvular heart disease.  She is a retired Marine scientist by profession, had bicuspid aortic valve and severe aortic regurgitation, normal coronary arteries by angiography on 10/27/16, familial hypertriglyceridemia, mild chronic dyspnea, psoriatic arthritis, Crohn's disease S/P colectomy and ileostomy placement, underwent aortic valve repair and reduction aortoplasty on 08/07/2017 at [facility]  by Dr. Bill Salinas.  Due to dyspnea on exertion and worsening aortic regurgitation, she underwent Aortic Valve Replacement (#23 CE Valve), Hemi arch  replacement (Hemishield graft #30) at [facility] clinic on 12/19/2019. 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  Patient presents today for follow-up after aortic valve replacement 12/19/2019.  Symptoms of fatigue, dyspnea, dizziness have significantly improved since last visit.  Her activity tolerance continues to improve and she will be starting cardiac rehab next month.  She continues to experience orthostatic hypotension, and has stopped metoprolol succinate since last visit.  Of note she is tachycardic today, will start diltiazem 120 mg once daily in order to reduce heart rate. Patient has not had repeat echocardiogram since surgery, will obtain repeat echo.   On exam patient's blood pressure in left arm is still higher than blood pressure and her right arm, and brachial pulse is more prominent in left versus right.  Suspect related to brachial artery injury during surgery at [facility].  Also on exam patient noted to have superficial saphenous vein thrombosis at axillary incision site likely due to previously placed central line from surgery.  It is small and focal without upper extremity involvement and has not changed since surgery  per patient.  Will continue to monitor, will likely resolve on its own and is unlikely to spread.  Paresthesia of her right hand has improved, was likely due to injury or irritation of the brachial plexus.    Reviewed patient's labs, hemoglobin has improved to 14.9.  Patient's cough continues to bother her, although it is improving.  Will send refill for [place] for as needed use. Encourage patient to continue to increase activity as tolerate and to increase calorie intake in order to gain weight back to baseline. Again reassured patient that symptoms will continue to improve.   There is single exposed suture at axillary incision which is bothersome to patient. Suture was removed at today's visit. The area was  cleaned, suture was removed using aseptic technique, and site was cleaned again. Informed patient to monitor the area for signs of infection and to let the office know if these develop, she expressed understanding.   Follow up in 3 months.   Patient was seen in collaboration with Dr. Einar Gip. He also reviewed patient's chart and examined the patient. Dr. Einar Gip is in agreement of the plan.    Alethia Berthold, PA-C 02/07/2020, 2:38 PM Office: [phone]

## 2020-02-19 ENCOUNTER — Other Ambulatory Visit: Payer: Medicare Other

## 2020-02-26 ENCOUNTER — Other Ambulatory Visit: Payer: Medicare Other

## 2020-02-29 ENCOUNTER — Telehealth (HOSPITAL_COMMUNITY): Payer: Self-pay | Admitting: Pharmacist

## 2020-03-01 ENCOUNTER — Other Ambulatory Visit: Payer: Self-pay

## 2020-03-01 ENCOUNTER — Ambulatory Visit: Payer: BC Managed Care – PPO | Admitting: Cardiology

## 2020-03-01 ENCOUNTER — Encounter (HOSPITAL_COMMUNITY)
Admission: RE | Admit: 2020-03-01 | Discharge: 2020-03-01 | Disposition: A | Discharge status: Home / Self Care | Payer: Medicare Other | Source: Ambulatory Visit | Attending: Cardiology | Admitting: Cardiology

## 2020-03-01 DIAGNOSIS — Z952 Presence of prosthetic heart valve: Secondary | ICD-10-CM

## 2020-03-01 NOTE — Progress Notes (Signed)
Cardiac Rehab Telephone Note:  Successful telephone encounter to Brittany Whitney to confirm Cardiac Rehab orientation appointment for 03/05/20 at 9:30 am. Nursing assessment completed. Patient questions answered. Instructions for appointment provided. Patient screening for Covid-19 negative.  Gissella Niblack E. Rollene Rotunda RN, BSN [place]. [facility]  Cardiac and Pulmonary Rehabilitation Phone: [phone] Fax: [phone]

## 2020-03-05 ENCOUNTER — Encounter (HOSPITAL_COMMUNITY)
Admission: RE | Admit: 2020-03-05 | Discharge: 2020-03-05 | Disposition: A | Discharge status: Home / Self Care | Payer: Medicare Other | Source: Ambulatory Visit | Attending: Cardiology | Admitting: Cardiology

## 2020-03-05 ENCOUNTER — Encounter (HOSPITAL_COMMUNITY): Payer: Self-pay

## 2020-03-05 ENCOUNTER — Other Ambulatory Visit: Payer: Self-pay

## 2020-03-05 VITALS — BP 108/70 | HR 90 | Resp 18 | Ht 67.0 in | Wt 119.7 lb

## 2020-03-05 DIAGNOSIS — Z952 Presence of prosthetic heart valve: Secondary | ICD-10-CM

## 2020-03-05 NOTE — Progress Notes (Signed)
Cardiac Individual Treatment Plan  Patient Details  Name: ADELA ESTEBAN MRN: [id number] Date of Birth: 06/27/1955 Referring Provider:     [facility] from 03/05/2020 in [place]  Referring Provider Adrian Prows, MD      Initial Encounter Date:    CARDIAC REHAB [facility] from 03/05/2020 in [place]  Date 03/05/20      Visit Diagnosis: S/P AVR (aortic valve replacement)  Patient's Home Medications on Admission:  Current Outpatient Medications:    acetaminophen (TYLENOL) 500 MG tablet, Take 500 mg by mouth. , Disp: , Rfl:    ALPRAZolam (XANAX) 0.25 MG tablet, Take 0.25 mg by mouth at bedtime as needed for anxiety. , Disp: , Rfl:    aspirin EC 81 MG tablet, Take 81 mg by mouth daily. Swallow whole. , Disp: , Rfl:    benzonatate (TESSALON PERLES) 100 MG capsule, Take 1 capsule (100 mg total) by mouth 3 (three) times daily as needed for cough., Disp: 20 capsule, Rfl: 0   Biotin 5000 MCG TABS, Take 1 tablet by mouth daily. , Disp: , Rfl:    Bismuth Subgallate (DEVROM) 200 MG CHEW, Chew 1 tablet by mouth 2 (two) times daily. , Disp: , Rfl:    cyclobenzaprine (FLEXERIL) 5 MG tablet, Take 5 mg by mouth daily. One tablet at bedtime. , Disp: , Rfl:    DHEA 10 MG CAPS, Take 1 capsule by mouth every morning. , Disp: , Rfl:    Diindolylmethane POWD, Take 1 capsule by mouth 2 (two) times daily. 100 mg capsule , Disp: , Rfl:    diltiazem (CARDIZEM CD) 120 MG 24 hr capsule, Take 1 capsule (120 mg total) by mouth daily., Disp: 30 capsule, Rfl: 3   doxycycline (PERIOSTAT) 20 MG tablet, Take 20 mg by mouth 2 (two) times daily. , Disp: , Rfl:    guaiFENesin-codeine 100-10 MG/5ML syrup, Take 10 mLs by mouth every 6 (six) hours as needed for cough., Disp: 120 mL, Rfl: 1   ibuprofen (ADVIL,MOTRIN) 200 MG tablet, Take 200 mg by mouth every 6 (six) hours as needed. , Disp: , Rfl:    icosapent Ethyl  (VASCEPA) 1 g capsule, Take 2 capsules (2 g total) by mouth 2 (two) times daily., Disp: 120 capsule, Rfl: 6   levothyroxine (SYNTHROID, LEVOTHROID) 100 MCG tablet, Take 137 mcg by mouth daily before breakfast. , Disp: , Rfl:    MAGNESIUM GLYCINATE PO, Take 200 mg by mouth. One tablet at bedtime. , Disp: , Rfl:    MAGNESIUM MALATE PO, Take 141 mg by mouth. One tablet every morning. , Disp: , Rfl:    meloxicam (MOBIC) 15 MG tablet, Take 1 tablet (15 mg total) by mouth daily., Disp: 90 tablet, Rfl: 2   Menatetrenone (VITAMIN K2) 100 MCG TABS, Take 100 mcg by mouth daily. , Disp: , Rfl:    naratriptan (AMERGE) 2.5 MG tablet, Take 2.5 mg by mouth as needed. Take one (1) tablet at onset of headache; if returns or does not resolve, may repeat after 4 hours; do not exceed five (5) mg in 24 hours. , Disp: , Rfl:    predniSONE (DELTASONE) 5 MG tablet, Take 5 mg by mouth every other day. TAKES IN AM, Disp: , Rfl:    progesterone (PROMETRIUM) 100 MG capsule, Take 125 mg by mouth at bedtime. COMPOUND MEDICATION IN PILL FORM, Disp: , Rfl:    rosuvastatin (CRESTOR) 5 MG tablet,  Take 5 mg by mouth once a week. One tablet once a week , Disp: , Rfl:    sulfaSALAzine (AZULFIDINE) 500 MG tablet, Take 1,000 mg by mouth 2 (two) times daily. , Disp: , Rfl:    SUMAtriptan (IMITREX) 50 MG tablet, Take 50 mg by mouth every 2 (two) hours as needed. , Disp: , Rfl:    traMADol (ULTRAM) 50 MG tablet, Take 1 tablet (50 mg total) by mouth 2 (two) times daily., Disp: 60 tablet, Rfl: 1   Vitamin D, Ergocalciferol, (DRISDOL) 50000 UNITS CAPS, Take by mouth every 14 (fourteen) days. , Disp: , Rfl:    zolpidem (AMBIEN) 10 MG tablet, Take 10 mg by mouth at bedtime. for sleep, Disp: , Rfl: 3  Past Medical History: Past Medical History:  Diagnosis Date   Arthritis    Crohn's colitis ([place])    Endometrial polyp    History of exercise stress test 12-25-2016  by dr Einar Gip   no evidence ischemia, exercise tolerence  excellent, no significant arrhythmias   History of malignant melanoma of skin    2005  post excision back area-- localized   Hypothyroidism    Ileostomy in place [facility])    for crohn's colitis -- external pouch   Migraines    Severe aortic valve regurgitation cardiologist-  dr Einar Gip--  dx by echo 2017   echo 06/ 10/2016 no change except noted mod. to sev. pulmonry htn/  verified by cardiac cath 07/ 2018 showed no evidence pulmonary htn   Vitamin D deficiency     Tobacco Use: Social History   Tobacco Use  Smoking Status Never Smoker  Smokeless Tobacco Never Used    Labs: Recent Review Flowsheet Data    Labs for ITP Cardiac and Pulmonary Rehab Latest Ref Rng & Units 10/27/2016 12/12/2019 12/12/2019 12/12/2019 12/12/2019   PHART 7.35 - 7.45 7.579(H) - - - 7.385   PCO2ART 32 - 48 mmHg 22.0(L) - - - 38.3   HCO3 20.0 - 28.0 mmol/L 20.6 23.4 23.4 22.2 23.0   TCO2 22 - 32 mmol/L 21 25 25 23 24    ACIDBASEDEF 0.0 - 2.0 mmol/L - 2.0 2.0 3.0(H) 2.0   O2SAT % 99.0 77.0 74.0 79.0 99.0      Capillary Blood Glucose: No results found for: GLUCAP   Exercise Target Goals: Exercise Program Goal: Individual exercise prescription set using results from initial 6 min walk test and THRR while considering  patients activity barriers and safety.   Exercise Prescription Goal: Initial exercise prescription builds to 30-45 minutes a day of aerobic activity, 2-3 days per week.  Home exercise guidelines will be given to patient during program as part of exercise prescription that the participant will acknowledge.  Activity Barriers & Risk Stratification:  Activity Barriers & Cardiac Risk Stratification - 03/05/20 1133      Activity Barriers & Cardiac Risk Stratification   Activity Barriers None    Cardiac Risk Stratification High           6 Minute Walk:  6 Minute Walk    Row Name 03/05/20 1006         6 Minute Walk   Phase Initial     Distance 1732 feet     Walk Time 6 minutes     #  of Rest Breaks 0     MPH 3.28     METS 4.39     RPE 13     Perceived Dyspnea  0     VO2  Peak 15.39     Symptoms No     Resting HR 90 bpm     Resting BP 108/70     Resting Oxygen Saturation  99 %     Exercise Oxygen Saturation  during 6 min walk 97 %     Max Ex. HR 115 bpm     Max Ex. BP 110/64     2 Minute Post BP 96/64            Oxygen Initial Assessment:   Oxygen Re-Evaluation:   Oxygen Discharge (Final Oxygen Re-Evaluation):   Initial Exercise Prescription:  Initial Exercise Prescription - 03/05/20 1100      Date of Initial Exercise RX and Referring Provider   Date 03/05/20    Referring Provider Adrian Prows, MD    Expected Discharge Date 05/03/20      Treadmill   MPH 3    Grade 1    Minutes 15    METs 3.71      NuStep   Level 2    SPM 85    Minutes 15    METs 2           Perform Capillary Blood Glucose checks as needed.  Exercise Prescription Changes:   Exercise Comments:   Exercise Goals and Review:   Exercise Goals    Row Name 03/05/20 1133             Exercise Goals   Increase Physical Activity Yes       Intervention Provide advice, education, support and counseling about physical activity/exercise needs.;Develop an individualized exercise prescription for aerobic and resistive training based on initial evaluation findings, risk stratification, comorbidities and participant's personal goals.       Expected Outcomes Short Term: Attend rehab on a regular basis to increase amount of physical activity.;Long Term: Add in home exercise to make exercise part of routine and to increase amount of physical activity.;Long Term: Exercising regularly at least 3-5 days a week.       Increase Strength and Stamina Yes       Intervention Provide advice, education, support and counseling about physical activity/exercise needs.;Develop an individualized exercise prescription for aerobic and resistive training based on initial evaluation findings, risk  stratification, comorbidities and participant's personal goals.       Expected Outcomes Short Term: Increase workloads from initial exercise prescription for resistance, speed, and METs.;Short Term: Perform resistance training exercises routinely during rehab and add in resistance training at home;Long Term: Improve cardiorespiratory fitness, muscular endurance and strength as measured by increased METs and functional capacity (6MWT)       Able to understand and use rate of perceived exertion (RPE) scale Yes       Intervention Provide education and explanation on how to use RPE scale       Expected Outcomes Short Term: Able to use RPE daily in rehab to express subjective intensity level;Long Term:  Able to use RPE to guide intensity level when exercising independently       Knowledge and understanding of Target Heart Rate Range (THRR) Yes       Intervention Provide education and explanation of THRR including how the numbers were predicted and where they are located for reference       Expected Outcomes Short Term: Able to state/look up THRR;Short Term: Able to use daily as guideline for intensity in rehab       Understanding of Exercise Prescription Yes       Intervention  Provide education, explanation, and written materials on patient's individual exercise prescription       Expected Outcomes Short Term: Able to explain program exercise prescription;Long Term: Able to explain home exercise prescription to exercise independently              Exercise Goals Re-Evaluation :   Discharge Exercise Prescription (Final Exercise Prescription Changes):   Nutrition:  Target Goals: Understanding of nutrition guidelines, daily intake of sodium <1560m, cholesterol <2031m calories 30% from fat and 7% or less from saturated fats, daily to have 5 or more servings of fruits and vegetables.  Biometrics:  Pre Biometrics - 03/05/20 0946      Pre Biometrics   Waist Circumference 29 inches    Hip  Circumference 36 inches    Waist to Hip Ratio 0.81 %    Triceps Skinfold 11 mm    % Body Fat 27 %    Grip Strength 26 kg    Flexibility 18.5 in    Single Leg Stand 30 seconds            Nutrition Therapy Plan and Nutrition Goals:   Nutrition Assessments:  MEDIFICTS Score Key:  ?70 Need to make dietary changes   40-70 Heart Healthy Diet  ? 40 Therapeutic Level Cholesterol Diet    Picture Your Plate Scores:  <4<76nhealthy dietary pattern with much room for improvement.  41-50 Dietary pattern unlikely to meet recommendations for good health and room for improvement.  51-60 More healthful dietary pattern, with some room for improvement.   >60 Healthy dietary pattern, although there may be some specific behaviors that could be improved.    Nutrition Goals Re-Evaluation:   Nutrition Goals Re-Evaluation:   Nutrition Goals Discharge (Final Nutrition Goals Re-Evaluation):   Psychosocial: Target Goals: Acknowledge presence or absence of significant depression and/or stress, maximize coping skills, provide positive support system. Participant is able to verbalize types and ability to use techniques and skills needed for reducing stress and depression.  Initial Review & Psychosocial Screening:  Initial Psych Review & Screening - 03/05/20 1225      Initial Review   Current issues with None Identified      Family Dynamics   Good Support System? Yes    Comments Ms. IrHogsettresents to cardiac rehab orientation with a positive attitude and outlook. Denies any psychosocial barriers to self health management or participation in cardiac rehab. She is married with adult children and surrounded by a large support system. No psychosocial barriers identified. No interventions needed.      Barriers   Psychosocial barriers to participate in program There are no identifiable barriers or psychosocial needs.      Screening Interventions   Interventions Encouraged to exercise            Quality of Life Scores:  Scores of 19 and below usually indicate a poorer quality of life in these areas.  A difference of  2-3 points is a clinically meaningful difference.  A difference of 2-3 points in the total score of the Quality of Life Index has been associated with significant improvement in overall quality of life, self-image, physical symptoms, and general health in studies assessing change in quality of life.  PHQ-9: Recent Review Flowsheet Data    [facility] 03/05/2020 02/14/2018 11/10/2017 07/07/2016 03/31/2016   Decreased Interest 0 0 0 0 0   Down, Depressed, Hopeless 0 0 0 0 0   PHQ - 2 Score 0 0 0 0 0  Interpretation of Total Score  Total Score Depression Severity:  1-4 = Minimal depression, 5-9 = Mild depression, 10-14 = Moderate depression, 15-19 = Moderately severe depression, 20-27 = Severe depression   Psychosocial Evaluation and Intervention:   Psychosocial Re-Evaluation:   Psychosocial Discharge (Final Psychosocial Re-Evaluation):   Vocational Rehabilitation: Provide vocational rehab assistance to qualifying candidates.   Vocational Rehab Evaluation & Intervention:  Vocational Rehab - 03/05/20 1227      Initial Vocational Rehab Evaluation & Intervention   Assessment shows need for Vocational Rehabilitation No           Education: Education Goals: Education classes will be provided on a weekly basis, covering required topics. Participant will state understanding/return demonstration of topics presented.  Learning Barriers/Preferences:  Learning Barriers/Preferences - 03/05/20 1129      Learning Barriers/Preferences   Learning Barriers None    Learning Preferences None           Education Topics: Count Your Pulse:  -Group instruction provided by verbal instruction, demonstration, patient participation and written materials to support subject.  Instructors address importance of being able to find your pulse and how to  count your pulse when at home without a heart monitor.  Patients get hands on experience counting their pulse with staff help and individually.   Heart Attack, Angina, and Risk Factor Modification:  -Group instruction provided by verbal instruction, video, and written materials to support subject.  Instructors address signs and symptoms of angina and heart attacks.    Also discuss risk factors for heart disease and how to make changes to improve heart health risk factors.   Functional Fitness:  -Group instruction provided by verbal instruction, demonstration, patient participation, and written materials to support subject.  Instructors address safety measures for doing things around the house.  Discuss how to get up and down off the floor, how to pick things up properly, how to safely get out of a chair without assistance, and balance training.   Meditation and Mindfulness:  -Group instruction provided by verbal instruction, patient participation, and written materials to support subject.  Instructor addresses importance of mindfulness and meditation practice to help reduce stress and improve awareness.  Instructor also leads participants through a meditation exercise.    Stretching for Flexibility and Mobility:  -Group instruction provided by verbal instruction, patient participation, and written materials to support subject.  Instructors lead participants through series of stretches that are designed to increase flexibility thus improving mobility.  These stretches are additional exercise for major muscle groups that are typically performed during regular warm up and cool down.   Hands Only CPR:  -Group verbal, video, and participation provides a basic overview of AHA guidelines for community CPR. Role-play of emergencies allow participants the opportunity to practice calling for help and chest compression technique with discussion of AED use.   Hypertension: -Group verbal and written  instruction that provides a basic overview of hypertension including the most recent diagnostic guidelines, risk factor reduction with self-care instructions and medication management.    Nutrition I class: Heart Healthy Eating:  -Group instruction provided by PowerPoint slides, verbal discussion, and written materials to support subject matter. The instructor gives an explanation and review of the Therapeutic Lifestyle Changes diet recommendations, which includes a discussion on lipid goals, dietary fat, sodium, fiber, plant stanol/sterol esters, sugar, and the components of a well-balanced, healthy diet.   Nutrition II class: Lifestyle Skills:  -Group instruction provided by PowerPoint slides, verbal discussion, and written materials to support  subject matter. The instructor gives an explanation and review of label reading, grocery shopping for heart health, heart healthy recipe modifications, and ways to make healthier choices when eating out.   Diabetes Question & Answer:  -Group instruction provided by PowerPoint slides, verbal discussion, and written materials to support subject matter. The instructor gives an explanation and review of diabetes co-morbidities, pre- and post-prandial blood glucose goals, pre-exercise blood glucose goals, signs, symptoms, and treatment of hypoglycemia and hyperglycemia, and foot care basics.   Diabetes Blitz:  -Group instruction provided by PowerPoint slides, verbal discussion, and written materials to support subject matter. The instructor gives an explanation and review of the physiology behind type 1 and type 2 diabetes, diabetes medications and rational behind using different medications, pre- and post-prandial blood glucose recommendations and Hemoglobin A1c goals, diabetes diet, and exercise including blood glucose guidelines for exercising safely.    Portion Distortion:  -Group instruction provided by PowerPoint slides, verbal discussion, written  materials, and food models to support subject matter. The instructor gives an explanation of serving size versus portion size, changes in portions sizes over the last 20 years, and what consists of a serving from each food group.   Stress Management:  -Group instruction provided by verbal instruction, video, and written materials to support subject matter.  Instructors review role of stress in heart disease and how to cope with stress positively.     CARDIAC REHAB PHASE II EXERCISE from 11/10/2017 in [place]  Date 11/10/17  Educator RN  Instruction Review Code 2- Demonstrated Understanding      Exercising on Your Own:  -Group instruction provided by verbal instruction, power point, and written materials to support subject.  Instructors discuss benefits of exercise, components of exercise, frequency and intensity of exercise, and end points for exercise.  Also discuss use of nitroglycerin and activating EMS.  Review options of places to exercise outside of rehab.  Review guidelines for sex with heart disease.   Cardiac Drugs I:  -Group instruction provided by verbal instruction and written materials to support subject.  Instructor reviews cardiac drug classes: antiplatelets, anticoagulants, beta blockers, and statins.  Instructor discusses reasons, side effects, and lifestyle considerations for each drug class.   Cardiac Drugs II:  -Group instruction provided by verbal instruction and written materials to support subject.  Instructor reviews cardiac drug classes: angiotensin converting enzyme inhibitors (ACE-I), angiotensin II receptor blockers (ARBs), nitrates, and calcium channel blockers.  Instructor discusses reasons, side effects, and lifestyle considerations for each drug class.   Anatomy and Physiology of the Circulatory System:  Group verbal and written instruction and models provide basic cardiac anatomy and physiology, with the coronary electrical  and arterial systems. Review of: AMI, Angina, Valve disease, Heart Failure, Peripheral Artery Disease, Cardiac Arrhythmia, Pacemakers, and the ICD.   Other Education:  -Group or individual verbal, written, or video instructions that support the educational goals of the cardiac rehab program.   Holiday Eating Survival Tips:  -Group instruction provided by PowerPoint slides, verbal discussion, and written materials to support subject matter. The instructor gives patients tips, tricks, and techniques to help them not only survive but enjoy the holidays despite the onslaught of food that accompanies the holidays.   Knowledge Questionnaire Score:   Core Components/Risk Factors/Patient Goals at Admission:  Personal Goals and Risk Factors at Admission - 03/05/20 1130      Core Components/Risk Factors/Patient Goals on Admission    Weight Management Weight Maintenance  Lipids Yes    Intervention Provide education and support for participant on nutrition & aerobic/resistive exercise along with prescribed medications to achieve LDL <69m, HDL >455m    Expected Outcomes Short Term: Participant states understanding of desired cholesterol values and is compliant with medications prescribed. Participant is following exercise prescription and nutrition guidelines.;Long Term: Cholesterol controlled with medications as prescribed, with individualized exercise RX and with personalized nutrition plan. Value goals: LDL < 7052mHDL > 40 mg.           Core Components/Risk Factors/Patient Goals Review:    Core Components/Risk Factors/Patient Goals at Discharge (Final Review):    ITP Comments:  ITP Comments    Row Name 03/05/20 0946           ITP Comments Dr. TraFransico Himdical Director Cardiac Rehab [facility]           Comments: Patient attended orientation on 03/05/2020 to review rules and guidelines for program.  Completed 6 minute walk test, Intitial ITP, and exercise prescription.   VSS. Telemetry-NSR with rare PVCs.  Asymptomatic. Safety measures and social distancing in place per CDC guidelines.

## 2020-03-11 ENCOUNTER — Encounter (HOSPITAL_COMMUNITY)
Admission: RE | Admit: 2020-03-11 | Discharge: 2020-03-11 | Disposition: A | Discharge status: Home / Self Care | Payer: Medicare Other | Source: Ambulatory Visit | Attending: Cardiology | Admitting: Cardiology

## 2020-03-11 ENCOUNTER — Other Ambulatory Visit: Payer: Self-pay

## 2020-03-11 DIAGNOSIS — Z952 Presence of prosthetic heart valve: Secondary | ICD-10-CM | POA: Diagnosis not present

## 2020-03-11 NOTE — Progress Notes (Signed)
Cardiac Individual Treatment Plan  Patient Details  Name: Brittany Whitney MRN: [id number] Date of Birth: 04-06-1956 Referring Provider:     [place] from 03/05/2020 in [place]  Referring Provider Adrian Prows, MD      Initial Encounter Date:    CARDIAC REHAB PHASE II ORIENTATION from 03/05/2020 in [place]  Date 03/05/20      Visit Diagnosis: S/P AVR (aortic valve replacement)  Patient's Home Medications on Admission:  Current Outpatient Medications:  .  acetaminophen (TYLENOL) 500 MG tablet, Take 500 mg by mouth. , Disp: , Rfl:  .  ALPRAZolam (XANAX) 0.25 MG tablet, Take 0.25 mg by mouth at bedtime as needed for anxiety. , Disp: , Rfl:  .  aspirin EC 81 MG tablet, Take 81 mg by mouth daily. Swallow whole. , Disp: , Rfl:  .  benzonatate (TESSALON PERLES) 100 MG capsule, Take 1 capsule (100 mg total) by mouth 3 (three) times daily as needed for cough., Disp: 20 capsule, Rfl: 0 .  Biotin 5000 MCG TABS, Take 1 tablet by mouth daily. , Disp: , Rfl:  .  Bismuth Subgallate (DEVROM) 200 MG CHEW, Chew 1 tablet by mouth 2 (two) times daily. , Disp: , Rfl:  .  cyclobenzaprine (FLEXERIL) 5 MG tablet, Take 5 mg by mouth daily. One tablet at bedtime. , Disp: , Rfl:  .  DHEA 10 MG CAPS, Take 1 capsule by mouth every morning. , Disp: , Rfl:  .  Diindolylmethane POWD, Take 1 capsule by mouth 2 (two) times daily. 100 mg capsule , Disp: , Rfl:  .  diltiazem (CARDIZEM CD) 120 MG 24 hr capsule, Take 1 capsule (120 mg total) by mouth daily., Disp: 30 capsule, Rfl: 3 .  doxycycline (PERIOSTAT) 20 MG tablet, Take 20 mg by mouth 2 (two) times daily. , Disp: , Rfl:  .  guaiFENesin-codeine 100-10 MG/5ML syrup, Take 10 mLs by mouth every 6 (six) hours as needed for cough., Disp: 120 mL, Rfl: 1 .  ibuprofen (ADVIL,MOTRIN) 200 MG tablet, Take 200 mg by mouth every 6 (six) hours as needed. , Disp: , Rfl:  .  icosapent Ethyl  (VASCEPA) 1 g capsule, Take 2 capsules (2 g total) by mouth 2 (two) times daily., Disp: 120 capsule, Rfl: 6 .  levothyroxine (SYNTHROID, LEVOTHROID) 100 MCG tablet, Take 137 mcg by mouth daily before breakfast. , Disp: , Rfl:  .  MAGNESIUM GLYCINATE PO, Take 200 mg by mouth. One tablet at bedtime. , Disp: , Rfl:  .  MAGNESIUM MALATE PO, Take 141 mg by mouth. One tablet every morning. , Disp: , Rfl:  .  meloxicam (MOBIC) 15 MG tablet, Take 1 tablet (15 mg total) by mouth daily., Disp: 90 tablet, Rfl: 2 .  Menatetrenone (VITAMIN K2) 100 MCG TABS, Take 100 mcg by mouth daily. , Disp: , Rfl:  .  naratriptan (AMERGE) 2.5 MG tablet, Take 2.5 mg by mouth as needed. Take one (1) tablet at onset of headache; if returns or does not resolve, may repeat after 4 hours; do not exceed five (5) mg in 24 hours. , Disp: , Rfl:  .  predniSONE (DELTASONE) 5 MG tablet, Take 5 mg by mouth every other day. TAKES IN AM, Disp: , Rfl:  .  progesterone (PROMETRIUM) 100 MG capsule, Take 125 mg by mouth at bedtime. COMPOUND MEDICATION IN PILL FORM, Disp: , Rfl:  .  rosuvastatin (CRESTOR) 5 MG tablet,  Take 5 mg by mouth once a week. One tablet once a week , Disp: , Rfl:  .  sulfaSALAzine (AZULFIDINE) 500 MG tablet, Take 1,000 mg by mouth 2 (two) times daily. , Disp: , Rfl:  .  SUMAtriptan (IMITREX) 50 MG tablet, Take 50 mg by mouth every 2 (two) hours as needed. , Disp: , Rfl:  .  traMADol (ULTRAM) 50 MG tablet, Take 1 tablet (50 mg total) by mouth 2 (two) times daily., Disp: 60 tablet, Rfl: 1 .  Vitamin D, Ergocalciferol, (DRISDOL) 50000 UNITS CAPS, Take by mouth every 14 (fourteen) days. , Disp: , Rfl:  .  zolpidem (AMBIEN) 10 MG tablet, Take 10 mg by mouth at bedtime. for sleep, Disp: , Rfl: 3  Past Medical History: Past Medical History:  Diagnosis Date  . Arthritis   . Crohn's colitis ([place])   . Endometrial polyp   . History of exercise stress test 12-25-2016  by dr Einar Gip   no evidence ischemia, exercise tolerence  excellent, no significant arrhythmias  . History of malignant melanoma of skin    2005  post excision back area-- localized  . Hypothyroidism   . Ileostomy in place [facility])    for crohn's colitis -- external pouch  . Migraines   . Severe aortic valve regurgitation cardiologist-  dr Einar Gip--  dx by echo 2017   echo 06/ 10/2016 no change except noted mod. to sev. pulmonry htn/  verified by cardiac cath 07/ 2018 showed no evidence pulmonary htn  . Vitamin D deficiency     Tobacco Use: Social History   Tobacco Use  Smoking Status Never Smoker  Smokeless Tobacco Never Used    Labs: Recent Review Flowsheet Data    Labs for ITP Cardiac and Pulmonary Rehab Latest Ref Rng & Units 10/27/2016 12/12/2019 12/12/2019 12/12/2019 12/12/2019   PHART 7.35 - 7.45 7.579(H) - - - 7.385   PCO2ART 32 - 48 mmHg 22.0(L) - - - 38.3   HCO3 20.0 - 28.0 mmol/L 20.6 23.4 23.4 22.2 23.0   TCO2 22 - 32 mmol/L 21 25 25 23 24    ACIDBASEDEF 0.0 - 2.0 mmol/L - 2.0 2.0 3.0(H) 2.0   O2SAT % 99.0 77.0 74.0 79.0 99.0      Capillary Blood Glucose: No results found for: GLUCAP   Exercise Target Goals: Exercise Program Goal: Individual exercise prescription set using results from initial 6 min walk test and THRR while considering  patient's activity barriers and safety.   Exercise Prescription Goal: Starting with aerobic activity 30 plus minutes a day, 3 days per week for initial exercise prescription. Provide home exercise prescription and guidelines that participant acknowledges understanding prior to discharge.  Activity Barriers & Risk Stratification:  Activity Barriers & Cardiac Risk Stratification - 03/05/20 1133      Activity Barriers & Cardiac Risk Stratification   Activity Barriers None    Cardiac Risk Stratification High           6 Minute Walk:  6 Minute Walk    Row Name 03/05/20 1006         6 Minute Walk   Phase Initial     Distance 1732 feet     Walk Time 6 minutes     # of Rest Breaks 0      MPH 3.28     METS 4.39     RPE 13     Perceived Dyspnea  0     VO2 Peak 15.39     Symptoms  No     Resting HR 90 bpm     Resting BP 108/70     Resting Oxygen Saturation  99 %     Exercise Oxygen Saturation  during 6 min walk 97 %     Max Ex. HR 115 bpm     Max Ex. BP 110/64     2 Minute Post BP 96/64            Oxygen Initial Assessment:   Oxygen Re-Evaluation:   Oxygen Discharge (Final Oxygen Re-Evaluation):   Initial Exercise Prescription:  Initial Exercise Prescription - 03/05/20 1100      Date of Initial Exercise RX and Referring Provider   Date 03/05/20    Referring Provider Adrian Prows, MD    Expected Discharge Date 05/03/20      Treadmill   MPH 3    Grade 1    Minutes 15    METs 3.71      NuStep   Level 2    SPM 85    Minutes 15    METs 2           Perform Capillary Blood Glucose checks as needed.  Exercise Prescription Changes:  Exercise Prescription Changes    Row Name 03/11/20 1000             Response to Exercise   Blood Pressure (Admit) 110/72       Blood Pressure (Exercise) 114/68       Blood Pressure (Exit) 112/70       Heart Rate (Admit) 102 bpm       Heart Rate (Exercise) 111 bpm       Heart Rate (Exit) 101 bpm       Rating of Perceived Exertion (Exercise) 11       Symptoms None       Comments Pt's first day of exercise in the CRP2 program       Duration Progress to 30 minutes of  aerobic without signs/symptoms of physical distress       Intensity THRR unchanged         Progression   Progression Continue to progress workloads to maintain intensity without signs/symptoms of physical distress.       Average METs 2.7         Resistance Training   Weight 2       Reps 10-15       Time 10 Minutes         Interval Training   Interval Training No         Treadmill   MPH 2.5       Grade 0       Minutes 11       METs 2.91         NuStep   Level 2       SPM 85       Minutes 20       METs 2.5               Exercise Comments:  Exercise Comments    Row Name 03/11/20 1035           Exercise Comments Pt's first day of exericise in the CRP2 program. Pt tolerated session well with no complaints.              Exercise Goals and Review:  Exercise Goals    Row Name 03/05/20 1133  Exercise Goals   Increase Physical Activity Yes       Intervention Provide advice, education, support and counseling about physical activity/exercise needs.;Develop an individualized exercise prescription for aerobic and resistive training based on initial evaluation findings, risk stratification, comorbidities and participant's personal goals.       Expected Outcomes Short Term: Attend rehab on a regular basis to increase amount of physical activity.;Long Term: Add in home exercise to make exercise part of routine and to increase amount of physical activity.;Long Term: Exercising regularly at least 3-5 days a week.       Increase Strength and Stamina Yes       Intervention Provide advice, education, support and counseling about physical activity/exercise needs.;Develop an individualized exercise prescription for aerobic and resistive training based on initial evaluation findings, risk stratification, comorbidities and participant's personal goals.       Expected Outcomes Short Term: Increase workloads from initial exercise prescription for resistance, speed, and METs.;Short Term: Perform resistance training exercises routinely during rehab and add in resistance training at home;Long Term: Improve cardiorespiratory fitness, muscular endurance and strength as measured by increased METs and functional capacity (6MWT)       Able to understand and use rate of perceived exertion (RPE) scale Yes       Intervention Provide education and explanation on how to use RPE scale       Expected Outcomes Short Term: Able to use RPE daily in rehab to express subjective intensity level;Long Term:  Able to use RPE to guide  intensity level when exercising independently       Knowledge and understanding of Target Heart Rate Range (THRR) Yes       Intervention Provide education and explanation of THRR including how the numbers were predicted and where they are located for reference       Expected Outcomes Short Term: Able to state/look up THRR;Short Term: Able to use daily as guideline for intensity in rehab       Understanding of Exercise Prescription Yes       Intervention Provide education, explanation, and written materials on patient's individual exercise prescription       Expected Outcomes Short Term: Able to explain program exercise prescription;Long Term: Able to explain home exercise prescription to exercise independently              Exercise Goals Re-Evaluation :  Exercise Goals Re-Evaluation    Row Name 03/11/20 1033             Exercise Goal Re-Evaluation   Exercise Goals Review Increase Physical Activity;Increase Strength and Stamina;Able to understand and use rate of perceived exertion (RPE) scale;Knowledge and understanding of Target Heart Rate Range (THRR);Understanding of Exercise Prescription       Comments Pt's first day of exercsie in the CRP2 program. Pt tolerated the session well and understands the exercise Rx, THRR, and RPE scale.       Expected Outcomes Will continue to monitor and progress patient's workloads as tolerated.               Discharge Exercise Prescription (Final Exercise Prescription Changes):  Exercise Prescription Changes - 03/11/20 1000      Response to Exercise   Blood Pressure (Admit) 110/72    Blood Pressure (Exercise) 114/68    Blood Pressure (Exit) 112/70    Heart Rate (Admit) 102 bpm    Heart Rate (Exercise) 111 bpm    Heart Rate (Exit) 101 bpm    Rating of Perceived  Exertion (Exercise) 11    Symptoms None    Comments Pt's first day of exercise in the CRP2 program    Duration Progress to 30 minutes of  aerobic without signs/symptoms of physical  distress    Intensity THRR unchanged      Progression   Progression Continue to progress workloads to maintain intensity without signs/symptoms of physical distress.    Average METs 2.7      Resistance Training   Weight 2    Reps 10-15    Time 10 Minutes      Interval Training   Interval Training No      Treadmill   MPH 2.5    Grade 0    Minutes 11    METs 2.91      NuStep   Level 2    SPM 85    Minutes 20    METs 2.5           Nutrition:  Target Goals: Understanding of nutrition guidelines, daily intake of sodium <1523m, cholesterol <2065m calories 30% from fat and 7% or less from saturated fats, daily to have 5 or more servings of fruits and vegetables.  Biometrics:  Pre Biometrics - 03/05/20 0946      Pre Biometrics   Waist Circumference 29 inches    Hip Circumference 36 inches    Waist to Hip Ratio 0.81 %    Triceps Skinfold 11 mm    % Body Fat 27 %    Grip Strength 26 kg    Flexibility 18.5 in    Single Leg Stand 30 seconds            Nutrition Therapy Plan and Nutrition Goals:   Nutrition Assessments:  MEDIFICTS Score Key:  ?70 Need to make dietary changes   40-70 Heart Healthy Diet  ? 40 Therapeutic Level Cholesterol Diet   Picture Your Plate Scores:  <4<73nhealthy dietary pattern with much room for improvement.  41-50 Dietary pattern unlikely to meet recommendations for good health and room for improvement.  51-60 More healthful dietary pattern, with some room for improvement.   >60 Healthy dietary pattern, although there may be some specific behaviors that could be improved.    Nutrition Goals Re-Evaluation:   Nutrition Goals Discharge (Final Nutrition Goals Re-Evaluation):   Psychosocial: Target Goals: Acknowledge presence or absence of significant depression and/or stress, maximize coping skills, provide positive support system. Participant is able to verbalize types and ability to use techniques and skills needed for  reducing stress and depression.  Initial Review & Psychosocial Screening:  Initial Psych Review & Screening - 03/05/20 1225      Initial Review   Current issues with None Identified      Family Dynamics   Good Support System? Yes    Comments Ms. IrCarpresents to cardiac rehab orientation with a positive attitude and outlook. Denies any psychosocial barriers to self health management or participation in cardiac rehab. She is married with adult children and surrounded by a large support system. No psychosocial barriers identified. No interventions needed.      Barriers   Psychosocial barriers to participate in program There are no identifiable barriers or psychosocial needs.      Screening Interventions   Interventions Encouraged to exercise           Quality of Life Scores:  Quality of Life - 03/11/20 1025      Quality of Life   Select Quality of Life  Quality of Life Scores   Health/Function Pre 27.57 %    Socioeconomic Pre 30 %    Psych/Spiritual Pre 30 %    Psych/Spiritual Post --    Psych/Spiritual % Change --    Family Pre 28.8 %    GLOBAL Pre 28.71 %          Scores of 19 and below usually indicate a poorer quality of life in these areas.  A difference of  2-3 points is a clinically meaningful difference.  A difference of 2-3 points in the total score of the Quality of Life Index has been associated with significant improvement in overall quality of life, self-image, physical symptoms, and general health in studies assessing change in quality of life.  PHQ-9: Recent Review Flowsheet Data    Depression screen [facility] 2/9 03/05/2020 02/14/2018 11/10/2017 07/07/2016 03/31/2016   Decreased Interest 0 0 0 0 0   Down, Depressed, Hopeless 0 0 0 0 0   PHQ - 2 Score 0 0 0 0 0     Interpretation of Total Score  Total Score Depression Severity:  1-4 = Minimal depression, 5-9 = Mild depression, 10-14 = Moderate depression, 15-19 = Moderately severe depression, 20-27 =  Severe depression   Psychosocial Evaluation and Intervention:   Psychosocial Re-Evaluation:  Psychosocial Re-Evaluation    [place] Name 03/11/20 1003             Psychosocial Re-Evaluation   Current issues with None Identified       Interventions Encouraged to attend Cardiac Rehabilitation for the exercise       Continue Psychosocial Services  No Follow up required              Psychosocial Discharge (Final Psychosocial Re-Evaluation):  Psychosocial Re-Evaluation - 03/11/20 1003      Psychosocial Re-Evaluation   Current issues with None Identified    Interventions Encouraged to attend Cardiac Rehabilitation for the exercise    Continue Psychosocial Services  No Follow up required           Vocational Rehabilitation: Provide vocational rehab assistance to qualifying candidates.   Vocational Rehab Evaluation & Intervention:  Vocational Rehab - 03/05/20 1227      Initial Vocational Rehab Evaluation & Intervention   Assessment shows need for Vocational Rehabilitation No           Education: Education Goals: Education classes will be provided on a weekly basis, covering required topics. Participant will state understanding/return demonstration of topics presented.  Learning Barriers/Preferences:  Learning Barriers/Preferences - 03/05/20 1129      Learning Barriers/Preferences   Learning Barriers None    Learning Preferences None           Education Topics: Hypertension, Hypertension Reduction -Define heart disease and high blood pressure. Discus how high blood pressure affects the body and ways to reduce high blood pressure.   Exercise and Your Heart -Discuss why it is important to exercise, the FITT principles of exercise, normal and abnormal responses to exercise, and how to exercise safely.   Angina -Discuss definition of angina, causes of angina, treatment of angina, and how to decrease risk of having angina.   Cardiac Medications -Review what the  following cardiac medications are used for, how they affect the body, and side effects that may occur when taking the medications.  Medications include Aspirin, Beta blockers, calcium channel blockers, ACE Inhibitors, angiotensin receptor blockers, diuretics, digoxin, and antihyperlipidemics.   Congestive Heart Failure -Discuss the definition of CHF, how  to live with CHF, the signs and symptoms of CHF, and how keep track of weight and sodium intake.   Heart Disease and Intimacy -Discus the effect sexual activity has on the heart, how changes occur during intimacy as we age, and safety during sexual activity.   Smoking Cessation / COPD -Discuss different methods to quit smoking, the health benefits of quitting smoking, and the definition of COPD.   Nutrition I: Fats -Discuss the types of cholesterol, what cholesterol does to the heart, and how cholesterol levels can be controlled.   Nutrition II: Labels -Discuss the different components of food labels and how to read food label   Heart Parts/Heart Disease and PAD -Discuss the anatomy of the heart, the pathway of blood circulation through the heart, and these are affected by heart disease.   Stress I: Signs and Symptoms -Discuss the causes of stress, how stress may lead to anxiety and depression, and ways to limit stress.   Stress II: Relaxation -Discuss different types of relaxation techniques to limit stress.   Warning Signs of Stroke / TIA -Discuss definition of a stroke, what the signs and symptoms are of a stroke, and how to identify when someone is having stroke.   Knowledge Questionnaire Score:  Knowledge Questionnaire Score - 03/11/20 1027      Knowledge Questionnaire Score   Pre Score 23/24           Core Components/Risk Factors/Patient Goals at Admission:  Personal Goals and Risk Factors at Admission - 03/05/20 1130      Core Components/Risk Factors/Patient Goals on Admission    Weight Management Weight  Maintenance    Lipids Yes    Intervention Provide education and support for participant on nutrition & aerobic/resistive exercise along with prescribed medications to achieve LDL <79m, HDL >47m    Expected Outcomes Short Term: Participant states understanding of desired cholesterol values and is compliant with medications prescribed. Participant is following exercise prescription and nutrition guidelines.;Long Term: Cholesterol controlled with medications as prescribed, with individualized exercise RX and with personalized nutrition plan. Value goals: LDL < 702mHDL > 40 mg.           Core Components/Risk Factors/Patient Goals Review:   Goals and Risk Factor Review    Row Name 03/11/20 1004             Core Components/Risk Factors/Patient Goals Review   Personal Goals Review Lipids;Weight Management/Obesity       Review LauMelishaarted exercise on 03/11/20 and did well with exercise. Skyra's vital signs were stable       Expected Outcomes LauTiniell continue to exercise at cardiac rehab for exercise, nutrition and life style modifications              Core Components/Risk Factors/Patient Goals at Discharge (Final Review):   Goals and Risk Factor Review - 03/11/20 1004      Core Components/Risk Factors/Patient Goals Review   Personal Goals Review Lipids;Weight Management/Obesity    Review LauMagonarted exercise on 03/11/20 and did well with exercise. Shelvy's vital signs were stable    Expected Outcomes LauFatemahll continue to exercise at cardiac rehab for exercise, nutrition and life style modifications           ITP Comments:  ITP Comments    Row Name 03/05/20 0946 03/11/20 1007         ITP Comments Dr. TraFransico Himdical Director Cardiac Rehab [place] 30 Day ITP Review. LauLeebaarted exercise on 03/11/20.  Syanne did well with exercise. Evalin 's vital signs were stable.             Comments: See ITP Comments.Barnet Pall, RN,BSN 03/11/2020 1:59 PM

## 2020-03-11 NOTE — Progress Notes (Signed)
Daily Session Note  Patient Details  Name: Brittany Whitney MRN: [id number] Date of Birth: 02/27/1956 Referring Provider:     [place] from 03/05/2020 in [place]  Referring Provider Brittany Prows, MD      Encounter Date: 03/11/2020  Check In:  Session Check In - 03/11/20 0923      Check-In   Supervising physician immediately available to respond to emergencies [facility] immediately available    Physician(s) Dr. Wynelle Whitney    Location [facility]    Staff Present Leda Roys, MS, ACSM-CEP, Exercise Physiologist;David Lilyan Punt, MS, EP-C, CCRP;Portia Rollene Rotunda, RN, Marga Melnick, RN, BSN    Virtual Visit No    Medication changes reported     No    Fall or balance concerns reported    No    Tobacco Cessation No Change    Warm-up and Cool-down Performed on first and last piece of equipment    Resistance Training Performed Yes    VAD Patient? No    PAD/SET Patient? No      Pain Assessment   Currently in Pain? No/denies    Multiple Pain Sites No           Capillary Blood Glucose: No results found for this or any previous visit (from the past 24 hour(s)).   Exercise Prescription Changes - 03/11/20 1000      Response to Exercise   Blood Pressure (Admit) 110/72    Blood Pressure (Exercise) 114/68    Blood Pressure (Exit) 112/70    Heart Rate (Admit) 102 bpm    Heart Rate (Exercise) 111 bpm    Heart Rate (Exit) 101 bpm    Rating of Perceived Exertion (Exercise) 11    Symptoms None    Comments Pt's first day of exercise in the CRP2 program    Duration Progress to 30 minutes of  aerobic without signs/symptoms of physical distress    Intensity THRR unchanged      Progression   Progression Continue to progress workloads to maintain intensity without signs/symptoms of physical distress.    Average METs 2.7      Resistance Training   Weight 2    Reps 10-15    Time 10 Minutes      Interval  Training   Interval Training No      Treadmill   MPH 2.5    Grade 0    Minutes 11    METs 2.91      NuStep   Level 2    SPM 85    Minutes 20    METs 2.5           Social History   Tobacco Use  Smoking Status Never Smoker  Smokeless Tobacco Never Used    Goals Met:  Exercise tolerated well No report of cardiac concerns or symptoms Strength training completed today  Goals Unmet:  Not Applicable  Comments: Brittany Whitney started cardiac rehab today.  Pt tolerated light exercise without difficulty. VSS, telemetry-Sinus Rhythm, Sinus Tach, asymptomatic.  Medication list reconciled. Pt denies barriers to medicaiton compliance.  PSYCHOSOCIAL ASSESSMENT:  PHQ-0. Pt exhibits positive coping skills, hopeful outlook with supportive family. No psychosocial needs identified at this time, no psychosocial interventions necessary.    Pt enjoys Cooking and travelloing.   Pt oriented to exercise equipment and routine.    Understanding verbalized.Brittany Pall, RN,BSN 03/11/2020 10:38 AM   Dr. Fransico Whitney is Medical Director for Cardiac Rehab at  [facility].

## 2020-03-13 ENCOUNTER — Encounter (HOSPITAL_COMMUNITY)
Admission: RE | Admit: 2020-03-13 | Discharge: 2020-03-13 | Disposition: A | Discharge status: Home / Self Care | Payer: Medicare Other | Source: Ambulatory Visit | Attending: Cardiology | Admitting: Cardiology

## 2020-03-13 ENCOUNTER — Other Ambulatory Visit: Payer: Self-pay

## 2020-03-13 DIAGNOSIS — Z952 Presence of prosthetic heart valve: Secondary | ICD-10-CM | POA: Diagnosis not present

## 2020-03-15 ENCOUNTER — Encounter (HOSPITAL_COMMUNITY): Payer: Medicare Other

## 2020-03-18 ENCOUNTER — Encounter (HOSPITAL_COMMUNITY): Payer: Medicare Other

## 2020-03-18 DIAGNOSIS — Z953 Presence of xenogenic heart valve: Secondary | ICD-10-CM | POA: Insufficient documentation

## 2020-03-18 DIAGNOSIS — Z95828 Presence of other vascular implants and grafts: Secondary | ICD-10-CM | POA: Insufficient documentation

## 2020-03-20 ENCOUNTER — Encounter (HOSPITAL_COMMUNITY)
Admission: RE | Admit: 2020-03-20 | Discharge: 2020-03-20 | Disposition: A | Discharge status: Home / Self Care | Payer: Medicare Other | Source: Ambulatory Visit | Attending: Cardiology | Admitting: Cardiology

## 2020-03-20 ENCOUNTER — Other Ambulatory Visit: Payer: Self-pay

## 2020-03-20 DIAGNOSIS — Z952 Presence of prosthetic heart valve: Secondary | ICD-10-CM | POA: Diagnosis present

## 2020-03-22 ENCOUNTER — Other Ambulatory Visit: Payer: Self-pay

## 2020-03-22 ENCOUNTER — Encounter (HOSPITAL_COMMUNITY)
Admission: RE | Admit: 2020-03-22 | Discharge: 2020-03-22 | Disposition: A | Discharge status: Home / Self Care | Payer: Medicare Other | Source: Ambulatory Visit | Attending: Cardiology | Admitting: Cardiology

## 2020-03-22 DIAGNOSIS — Z952 Presence of prosthetic heart valve: Secondary | ICD-10-CM | POA: Diagnosis not present

## 2020-03-22 NOTE — Progress Notes (Signed)
Brittany Whitney 64 y.o. female Nutrition Note  Visit Diagnosis: S/P AVR (aortic valve replacement)  Past Medical History:  Diagnosis Date  . Arthritis   . Crohn's colitis ([place])   . Endometrial polyp   . History of exercise stress test 12-25-2016  by dr Einar Gip   no evidence ischemia, exercise tolerence excellent, no significant arrhythmias  . History of malignant melanoma of skin    2005  post excision back area-- localized  . Hypothyroidism   . Ileostomy in place [facility])    for crohn's colitis -- external pouch  . Migraines   . Severe aortic valve regurgitation cardiologist-  dr Einar Gip--  dx by echo 2017   echo 06/ 10/2016 no change except noted mod. to sev. pulmonry htn/  verified by cardiac cath 07/ 2018 showed no evidence pulmonary htn  . Vitamin D deficiency      Medications reviewed.   Current Outpatient Medications:  .  acetaminophen (TYLENOL) 500 MG tablet, Take 500 mg by mouth. , Disp: , Rfl:  .  ALPRAZolam (XANAX) 0.25 MG tablet, Take 0.25 mg by mouth at bedtime as needed for anxiety. , Disp: , Rfl:  .  aspirin EC 81 MG tablet, Take 81 mg by mouth daily. Swallow whole. , Disp: , Rfl:  .  benzonatate (TESSALON PERLES) 100 MG capsule, Take 1 capsule (100 mg total) by mouth 3 (three) times daily as needed for cough., Disp: 20 capsule, Rfl: 0 .  Biotin 5000 MCG TABS, Take 1 tablet by mouth daily. , Disp: , Rfl:  .  Bismuth Subgallate (DEVROM) 200 MG CHEW, Chew 1 tablet by mouth 2 (two) times daily. , Disp: , Rfl:  .  cyclobenzaprine (FLEXERIL) 5 MG tablet, Take 5 mg by mouth daily. One tablet at bedtime. , Disp: , Rfl:  .  DHEA 10 MG CAPS, Take 1 capsule by mouth every morning. , Disp: , Rfl:  .  Diindolylmethane POWD, Take 1 capsule by mouth 2 (two) times daily. 100 mg capsule , Disp: , Rfl:  .  diltiazem (CARDIZEM CD) 120 MG 24 hr capsule, Take 1 capsule (120 mg total) by mouth daily., Disp: 30 capsule, Rfl: 3 .  doxycycline (PERIOSTAT) 20 MG tablet, Take 20 mg by mouth 2 (two)  times daily. , Disp: , Rfl:  .  guaiFENesin-codeine 100-10 MG/5ML syrup, Take 10 mLs by mouth every 6 (six) hours as needed for cough., Disp: 120 mL, Rfl: 1 .  ibuprofen (ADVIL,MOTRIN) 200 MG tablet, Take 200 mg by mouth every 6 (six) hours as needed. , Disp: , Rfl:  .  icosapent Ethyl (VASCEPA) 1 g capsule, Take 2 capsules (2 g total) by mouth 2 (two) times daily., Disp: 120 capsule, Rfl: 6 .  levothyroxine (SYNTHROID, LEVOTHROID) 100 MCG tablet, Take 137 mcg by mouth daily before breakfast. , Disp: , Rfl:  .  MAGNESIUM GLYCINATE PO, Take 200 mg by mouth. One tablet at bedtime. , Disp: , Rfl:  .  MAGNESIUM MALATE PO, Take 141 mg by mouth. One tablet every morning. , Disp: , Rfl:  .  meloxicam (MOBIC) 15 MG tablet, Take 1 tablet (15 mg total) by mouth daily., Disp: 90 tablet, Rfl: 2 .  Menatetrenone (VITAMIN K2) 100 MCG TABS, Take 100 mcg by mouth daily. , Disp: , Rfl:  .  naratriptan (AMERGE) 2.5 MG tablet, Take 2.5 mg by mouth as needed. Take one (1) tablet at onset of headache; if returns or does not resolve, may repeat after 4 hours;  do not exceed five (5) mg in 24 hours. , Disp: , Rfl:  .  predniSONE (DELTASONE) 5 MG tablet, Take 5 mg by mouth every other day. TAKES IN AM, Disp: , Rfl:  .  progesterone (PROMETRIUM) 100 MG capsule, Take 125 mg by mouth at bedtime. COMPOUND MEDICATION IN PILL FORM, Disp: , Rfl:  .  rosuvastatin (CRESTOR) 5 MG tablet, Take 5 mg by mouth once a week. One tablet once a week , Disp: , Rfl:  .  sulfaSALAzine (AZULFIDINE) 500 MG tablet, Take 1,000 mg by mouth 2 (two) times daily. , Disp: , Rfl:  .  SUMAtriptan (IMITREX) 50 MG tablet, Take 50 mg by mouth every 2 (two) hours as needed. , Disp: , Rfl:  .  traMADol (ULTRAM) 50 MG tablet, Take 1 tablet (50 mg total) by mouth 2 (two) times daily., Disp: 60 tablet, Rfl: 1 .  Vitamin D, Ergocalciferol, (DRISDOL) 50000 UNITS CAPS, Take by mouth every 14 (fourteen) days. , Disp: , Rfl:  .  zolpidem (AMBIEN) 10 MG tablet, Take 10  mg by mouth at bedtime. for sleep, Disp: , Rfl: 3   Ht Readings from Last 1 Encounters:  03/05/20 5' 7"  (1.702 m)     Wt Readings from Last 3 Encounters:  03/05/20 119 lb 11.4 oz (54.3 kg)  02/07/20 114 lb (51.7 kg)  01/08/20 114 lb (51.7 kg)     There is no height or weight on file to calculate BMI.   Social History   Tobacco Use  Smoking Status Never Smoker  Smokeless Tobacco Never Used     No results found for: CHOL No results found for: HDL No results found for: LDLCALC No results found for: TRIG   No results found for: HGBA1C   CBG (last 3)  No results for input(s): GLUCAP in the last 72 hours.   Nutrition Note  Spoke with pt. Nutrition Plan and Nutrition Survey goals reviewed with pt. Pt is following a Heart Healthy diet.   Pt does not feel education or nutrition therapy is necessary at this time.    Nutrition Diagnosis ? No nutrition diagnosis at this time  Michaele Offer, MS, RDN, LDN

## 2020-03-25 ENCOUNTER — Encounter (HOSPITAL_COMMUNITY)
Admission: RE | Admit: 2020-03-25 | Discharge: 2020-03-25 | Disposition: A | Discharge status: Home / Self Care | Payer: Medicare Other | Source: Ambulatory Visit | Attending: Cardiology | Admitting: Cardiology

## 2020-03-25 ENCOUNTER — Other Ambulatory Visit: Payer: Self-pay

## 2020-03-25 DIAGNOSIS — Z952 Presence of prosthetic heart valve: Secondary | ICD-10-CM | POA: Diagnosis not present

## 2020-03-26 NOTE — Progress Notes (Signed)
Reviewed home exercise Rx with patient today. Pt is walking at home 2-3x/wk for 30-45 minutes. Encouraged warm-up, cool-down and stretching as part of her regimen. Hydration encouraged before, during and after exercise. Discussed THRR of 62-150 and keeping RPE between 11-13 during exercise. Dicussed weather parameters for temperature and humidity if exercising outdoors. Reviewed S/S that would require patient to terminate exercise and when to call MD vs 911. Pt verbalized understanding of the home exercise Rx and was provided a copy.  Lesly Rubenstein MS, ACSM-EP-C, CCRP

## 2020-03-27 ENCOUNTER — Other Ambulatory Visit: Payer: Self-pay

## 2020-03-27 ENCOUNTER — Encounter (HOSPITAL_COMMUNITY)
Admission: RE | Admit: 2020-03-27 | Discharge: 2020-03-27 | Disposition: A | Discharge status: Home / Self Care | Payer: Medicare Other | Source: Ambulatory Visit | Attending: Cardiology | Admitting: Cardiology

## 2020-03-27 DIAGNOSIS — Z952 Presence of prosthetic heart valve: Secondary | ICD-10-CM

## 2020-03-29 ENCOUNTER — Encounter (HOSPITAL_COMMUNITY): Payer: Medicare Other

## 2020-04-01 ENCOUNTER — Encounter (HOSPITAL_COMMUNITY)
Admission: RE | Admit: 2020-04-01 | Discharge: 2020-04-01 | Disposition: A | Discharge status: Home / Self Care | Payer: Medicare Other | Source: Ambulatory Visit | Attending: Cardiology | Admitting: Cardiology

## 2020-04-01 ENCOUNTER — Other Ambulatory Visit: Payer: Self-pay

## 2020-04-01 DIAGNOSIS — Z952 Presence of prosthetic heart valve: Secondary | ICD-10-CM | POA: Diagnosis not present

## 2020-04-02 NOTE — Progress Notes (Signed)
Cardiac Individual Treatment Plan  Patient Details  Name: Brittany Whitney MRN: [id number] Date of Birth: 1955-10-18 Referring Provider:   Flowsheet Row CARDIAC REHAB PHASE II ORIENTATION from 03/05/2020 in [place]  Referring Provider Adrian Prows, MD      Initial Encounter Date:  [name] PHASE II ORIENTATION from 03/05/2020 in [name]  Date 03/05/20      Visit Diagnosis: S/P AVR (aortic valve replacement)  Patient's Home Medications on Admission:  Current Outpatient Medications:  .  acetaminophen (TYLENOL) 500 MG tablet, Take 500 mg by mouth. , Disp: , Rfl:  .  ALPRAZolam (XANAX) 0.25 MG tablet, Take 0.25 mg by mouth at bedtime as needed for anxiety. , Disp: , Rfl:  .  aspirin EC 81 MG tablet, Take 81 mg by mouth daily. Swallow whole. , Disp: , Rfl:  .  benzonatate (TESSALON PERLES) 100 MG capsule, Take 1 capsule (100 mg total) by mouth 3 (three) times daily as needed for cough., Disp: 20 capsule, Rfl: 0 .  Biotin 5000 MCG TABS, Take 1 tablet by mouth daily. , Disp: , Rfl:  .  Bismuth Subgallate (DEVROM) 200 MG CHEW, Chew 1 tablet by mouth 2 (two) times daily. , Disp: , Rfl:  .  cyclobenzaprine (FLEXERIL) 5 MG tablet, Take 5 mg by mouth daily. One tablet at bedtime. , Disp: , Rfl:  .  DHEA 10 MG CAPS, Take 1 capsule by mouth every morning. , Disp: , Rfl:  .  Diindolylmethane POWD, Take 1 capsule by mouth 2 (two) times daily. 100 mg capsule , Disp: , Rfl:  .  diltiazem (CARDIZEM CD) 120 MG 24 hr capsule, Take 1 capsule (120 mg total) by mouth daily., Disp: 30 capsule, Rfl: 3 .  doxycycline (PERIOSTAT) 20 MG tablet, Take 20 mg by mouth 2 (two) times daily. , Disp: , Rfl:  .  guaiFENesin-codeine 100-10 MG/5ML syrup, Take 10 mLs by mouth every 6 (six) hours as needed for cough., Disp: 120 mL, Rfl: 1 .  ibuprofen (ADVIL,MOTRIN) 200 MG tablet, Take 200 mg by mouth every 6 (six) hours as needed. , Disp: ,  Rfl:  .  icosapent Ethyl (VASCEPA) 1 g capsule, Take 2 capsules (2 g total) by mouth 2 (two) times daily., Disp: 120 capsule, Rfl: 6 .  levothyroxine (SYNTHROID, LEVOTHROID) 100 MCG tablet, Take 137 mcg by mouth daily before breakfast. , Disp: , Rfl:  .  MAGNESIUM GLYCINATE PO, Take 200 mg by mouth. One tablet at bedtime. , Disp: , Rfl:  .  MAGNESIUM MALATE PO, Take 141 mg by mouth. One tablet every morning. , Disp: , Rfl:  .  meloxicam (MOBIC) 15 MG tablet, Take 1 tablet (15 mg total) by mouth daily., Disp: 90 tablet, Rfl: 2 .  Menatetrenone (VITAMIN K2) 100 MCG TABS, Take 100 mcg by mouth daily. , Disp: , Rfl:  .  naratriptan (AMERGE) 2.5 MG tablet, Take 2.5 mg by mouth as needed. Take one (1) tablet at onset of headache; if returns or does not resolve, may repeat after 4 hours; do not exceed five (5) mg in 24 hours. , Disp: , Rfl:  .  predniSONE (DELTASONE) 5 MG tablet, Take 5 mg by mouth every other day. TAKES IN AM, Disp: , Rfl:  .  progesterone (PROMETRIUM) 100 MG capsule, Take 125 mg by mouth at bedtime. COMPOUND MEDICATION IN PILL FORM, Disp: , Rfl:  .  rosuvastatin (CRESTOR) 5 MG tablet,  Take 5 mg by mouth once a week. One tablet once a week , Disp: , Rfl:  .  sulfaSALAzine (AZULFIDINE) 500 MG tablet, Take 1,000 mg by mouth 2 (two) times daily. , Disp: , Rfl:  .  SUMAtriptan (IMITREX) 50 MG tablet, Take 50 mg by mouth every 2 (two) hours as needed. , Disp: , Rfl:  .  traMADol (ULTRAM) 50 MG tablet, Take 1 tablet (50 mg total) by mouth 2 (two) times daily., Disp: 60 tablet, Rfl: 1 .  Vitamin D, Ergocalciferol, (DRISDOL) 50000 UNITS CAPS, Take by mouth every 14 (fourteen) days. , Disp: , Rfl:  .  zolpidem (AMBIEN) 10 MG tablet, Take 10 mg by mouth at bedtime. for sleep, Disp: , Rfl: 3  Past Medical History: Past Medical History:  Diagnosis Date  . Arthritis   . Crohn's colitis ([place])   . Endometrial polyp   . History of exercise stress test 12-25-2016  by dr Einar Gip   no evidence  ischemia, exercise tolerence excellent, no significant arrhythmias  . History of malignant melanoma of skin    2005  post excision back area-- localized  . Hypothyroidism   . Ileostomy in place [facility])    for crohn's colitis -- external pouch  . Migraines   . Severe aortic valve regurgitation cardiologist-  dr Einar Gip--  dx by echo 2017   echo 06/ 10/2016 no change except noted mod. to sev. pulmonry htn/  verified by cardiac cath 07/ 2018 showed no evidence pulmonary htn  . Vitamin D deficiency     Tobacco Use: Social History   Tobacco Use  Smoking Status Never Smoker  Smokeless Tobacco Never Used    Labs: Recent Review Flowsheet Data    Labs for ITP Cardiac and Pulmonary Rehab Latest Ref Rng & Units 10/27/2016 12/12/2019 12/12/2019 12/12/2019 12/12/2019   PHART 7.350 - 7.450 7.579(H) - - - 7.385   PCO2ART 32.0 - 48.0 mmHg 22.0(L) - - - 38.3   HCO3 20.0 - 28.0 mmol/L 20.6 23.4 23.4 22.2 23.0   TCO2 22 - 32 mmol/L 21 25 25 23 24    ACIDBASEDEF 0.0 - 2.0 mmol/L - 2.0 2.0 3.0(H) 2.0   O2SAT % 99.0 77.0 74.0 79.0 99.0      Capillary Blood Glucose: No results found for: GLUCAP   Exercise Target Goals: Exercise Program Goal: Individual exercise prescription set using results from initial 6 min walk test and THRR while considering  patient's activity barriers and safety.   Exercise Prescription Goal: Starting with aerobic activity 30 plus minutes a day, 3 days per week for initial exercise prescription. Provide home exercise prescription and guidelines that participant acknowledges understanding prior to discharge.  Activity Barriers & Risk Stratification:  Activity Barriers & Cardiac Risk Stratification - 03/05/20 1133      Activity Barriers & Cardiac Risk Stratification   Activity Barriers None    Cardiac Risk Stratification High           6 Minute Walk:  6 Minute Walk    Row Name 03/05/20 1006         6 Minute Walk   Phase Initial     Distance 1732 feet     Walk Time  6 minutes     # of Rest Breaks 0     MPH 3.28     METS 4.39     RPE 13     Perceived Dyspnea  0     VO2 Peak 15.39     Symptoms  No     Resting HR 90 bpm     Resting BP 108/70     Resting Oxygen Saturation  99 %     Exercise Oxygen Saturation  during 6 min walk 97 %     Max Ex. HR 115 bpm     Max Ex. BP 110/64     2 Minute Post BP 96/64            Oxygen Initial Assessment:   Oxygen Re-Evaluation:   Oxygen Discharge (Final Oxygen Re-Evaluation):   Initial Exercise Prescription:  Initial Exercise Prescription - 03/05/20 1100      Date of Initial Exercise RX and Referring Provider   Date 03/05/20    Referring Provider Adrian Prows, MD    Expected Discharge Date 05/03/20      Treadmill   MPH 3    Grade 1    Minutes 15    METs 3.71      NuStep   Level 2    SPM 85    Minutes 15    METs 2           Perform Capillary Blood Glucose checks as needed.  Exercise Prescription Changes:   Exercise Prescription Changes    Row Name 03/11/20 1000 03/20/20 1056 04/01/20 1050         Response to Exercise   Blood Pressure (Admit) 110/72 100/64 102/72     Blood Pressure (Exercise) 114/68 108/70 100/70     Blood Pressure (Exit) 112/70 98/68 100/62     Heart Rate (Admit) 102 bpm 99 bpm 93 bpm     Heart Rate (Exercise) 111 bpm 116 bpm 114 bpm     Heart Rate (Exit) 101 bpm 97 bpm 93 bpm     Rating of Perceived Exertion (Exercise) 11 11 11      Symptoms None None None     Comments Pt's first day of exercise in the CRP2 program Revieviewed METs and home exercise Rx Reviewed METs     Duration Progress to 30 minutes of  aerobic without signs/symptoms of physical distress Continue with 30 min of aerobic exercise without signs/symptoms of physical distress. Continue with 30 min of aerobic exercise without signs/symptoms of physical distress.     Intensity THRR unchanged THRR unchanged THRR unchanged           Progression   Progression Continue to progress workloads to  maintain intensity without signs/symptoms of physical distress. Continue to progress workloads to maintain intensity without signs/symptoms of physical distress. Continue to progress workloads to maintain intensity without signs/symptoms of physical distress.     Average METs 2.7 2.9 2.9           Resistance Training   Weight 2 --  No weights done on wednesdays 3 lbs     Reps 10-15 -- 10-15     Time 10 Minutes -- 10 Minutes           Interval Training   Interval Training No No No           Treadmill   MPH 2.5 2.5 2.5     Grade 0 0 0     Minutes 11 11 11      METs 2.91 2.91 2.91           NuStep   Level 2 2 2      SPM 85 85 85     Minutes 20 15 15      METs 2.5 2.9 2.9  Home Exercise Plan   Plans to continue exercise at -- Home (comment) Home (comment)     Frequency -- Add 2 additional days to program exercise sessions. Add 2 additional days to program exercise sessions.     Initial Home Exercises Provided -- 03/20/20 03/20/20            Exercise Comments:   Exercise Comments    Row Name 03/11/20 1035 03/20/20 1103 04/02/20 0902       Exercise Comments Pt's first day of exericise in the CRP2 program. Pt tolerated session well with no complaints. Reviewed home exercise Rx today with patient. Pt will contiue to walk at home 2-3 x/week for 30-45 minutes. Reviewed METS and they are at a plateau. Pt agrees to make increases in the workloads next session. Continues to walk at home.            Exercise Goals and Review:   Exercise Goals    Row Name 03/05/20 1133             Exercise Goals   Increase Physical Activity Yes       Intervention Provide advice, education, support and counseling about physical activity/exercise needs.;Develop an individualized exercise prescription for aerobic and resistive training based on initial evaluation findings, risk stratification, comorbidities and participant's personal goals.       Expected Outcomes Short Term: Attend  rehab on a regular basis to increase amount of physical activity.;Long Term: Add in home exercise to make exercise part of routine and to increase amount of physical activity.;Long Term: Exercising regularly at least 3-5 days a week.       Increase Strength and Stamina Yes       Intervention Provide advice, education, support and counseling about physical activity/exercise needs.;Develop an individualized exercise prescription for aerobic and resistive training based on initial evaluation findings, risk stratification, comorbidities and participant's personal goals.       Expected Outcomes Short Term: Increase workloads from initial exercise prescription for resistance, speed, and METs.;Short Term: Perform resistance training exercises routinely during rehab and add in resistance training at home;Long Term: Improve cardiorespiratory fitness, muscular endurance and strength as measured by increased METs and functional capacity (6MWT)       Able to understand and use rate of perceived exertion (RPE) scale Yes       Intervention Provide education and explanation on how to use RPE scale       Expected Outcomes Short Term: Able to use RPE daily in rehab to express subjective intensity level;Long Term:  Able to use RPE to guide intensity level when exercising independently       Knowledge and understanding of Target Heart Rate Range (THRR) Yes       Intervention Provide education and explanation of THRR including how the numbers were predicted and where they are located for reference       Expected Outcomes Short Term: Able to state/look up THRR;Short Term: Able to use daily as guideline for intensity in rehab       Understanding of Exercise Prescription Yes       Intervention Provide education, explanation, and written materials on patient's individual exercise prescription       Expected Outcomes Short Term: Able to explain program exercise prescription;Long Term: Able to explain home exercise prescription  to exercise independently              Exercise Goals Re-Evaluation :  Exercise Goals Re-Evaluation    Row Name 03/11/20 [phone]  03/20/20 1100           Exercise Goal Re-Evaluation   Exercise Goals Review Increase Physical Activity;Increase Strength and Stamina;Able to understand and use rate of perceived exertion (RPE) scale;Knowledge and understanding of Target Heart Rate Range (THRR);Understanding of Exercise Prescription Increase Physical Activity;Increase Strength and Stamina;Able to understand and use rate of perceived exertion (RPE) scale;Knowledge and understanding of Target Heart Rate Range (THRR);Able to check pulse independently;Understanding of Exercise Prescription      Comments Pt's first day of exercsie in the CRP2 program. Pt tolerated the session well and understands the exercise Rx, THRR, and RPE scale. Reviewed METs and home exercise Rx. Pt is exercising at home 2-3x/week by walking. Provided copy of home exercise Rx and pt verbalized understnading.      Expected Outcomes Will continue to monitor and progress patient's workloads as tolerated. Pt will continue to walk at home 2-3 x/week in addtion to the CRP2 program 3x/week.              Discharge Exercise Prescription (Final Exercise Prescription Changes):  Exercise Prescription Changes - 04/01/20 1050      Response to Exercise   Blood Pressure (Admit) 102/72    Blood Pressure (Exercise) 100/70    Blood Pressure (Exit) 100/62    Heart Rate (Admit) 93 bpm    Heart Rate (Exercise) 114 bpm    Heart Rate (Exit) 93 bpm    Rating of Perceived Exertion (Exercise) 11    Symptoms None    Comments Reviewed METs    Duration Continue with 30 min of aerobic exercise without signs/symptoms of physical distress.    Intensity THRR unchanged      Progression   Progression Continue to progress workloads to maintain intensity without signs/symptoms of physical distress.    Average METs 2.9      Resistance Training   Weight 3  lbs    Reps 10-15    Time 10 Minutes      Interval Training   Interval Training No      Treadmill   MPH 2.5    Grade 0    Minutes 11    METs 2.91      NuStep   Level 2    SPM 85    Minutes 15    METs 2.9      Home Exercise Plan   Plans to continue exercise at Home (comment)    Frequency Add 2 additional days to program exercise sessions.    Initial Home Exercises Provided 03/20/20           Nutrition:  Target Goals: Understanding of nutrition guidelines, daily intake of sodium <1520m, cholesterol <2013m calories 30% from fat and 7% or less from saturated fats, daily to have 5 or more servings of fruits and vegetables.  Biometrics:  Pre Biometrics - 03/05/20 0946      Pre Biometrics   Waist Circumference 29 inches    Hip Circumference 36 inches    Waist to Hip Ratio 0.81 %    Triceps Skinfold 11 mm    % Body Fat 27 %    Grip Strength 26 kg    Flexibility 18.5 in    Single Leg Stand 30 seconds            Nutrition Therapy Plan and Nutrition Goals:  Nutrition Therapy & Goals - 04/02/20 0729      Nutrition Therapy   Diet General, healthful      Personal Nutrition Goals  Nutrition Goal Continue following a healthy diet           Nutrition Assessments:  MEDIFICTS Score Key:  ?70 Need to make dietary changes   40-70 Heart Healthy Diet  ? 40 Therapeutic Level Cholesterol Diet   Picture Your Plate Scores:  <18 Unhealthy dietary pattern with much room for improvement.  41-50 Dietary pattern unlikely to meet recommendations for good health and room for improvement.  51-60 More healthful dietary pattern, with some room for improvement.   >60 Healthy dietary pattern, although there may be some specific behaviors that could be improved.    Nutrition Goals Re-Evaluation:  Nutrition Goals Re-Evaluation    [place] Name 04/02/20 0734             Goals   Current Weight 120 lb 5.9 oz (54.6 kg)       Nutrition Goal Continue following a healthy  diet              Nutrition Goals Discharge (Final Nutrition Goals Re-Evaluation):  Nutrition Goals Re-Evaluation - 04/02/20 0734      Goals   Current Weight 120 lb 5.9 oz (54.6 kg)    Nutrition Goal Continue following a healthy diet           Psychosocial: Target Goals: Acknowledge presence or absence of significant depression and/or stress, maximize coping skills, provide positive support system. Participant is able to verbalize types and ability to use techniques and skills needed for reducing stress and depression.  Initial Review & Psychosocial Screening:  Initial Psych Review & Screening - 03/05/20 1225      Initial Review   Current issues with None Identified      Family Dynamics   Good Support System? Yes    Comments Ms. Prestwood presents to cardiac rehab orientation with a positive attitude and outlook. Denies any psychosocial barriers to self health management or participation in cardiac rehab. She is married with adult children and surrounded by a large support system. No psychosocial barriers identified. No interventions needed.      Barriers   Psychosocial barriers to participate in program There are no identifiable barriers or psychosocial needs.      Screening Interventions   Interventions Encouraged to exercise           Quality of Life Scores:  Quality of Life - 03/11/20 1025      Quality of Life   Select Quality of Life      Quality of Life Scores   Health/Function Pre 27.57 %    Socioeconomic Pre 30 %    Psych/Spiritual Pre 30 %    Psych/Spiritual Post --    Psych/Spiritual % Change --    Family Pre 28.8 %    GLOBAL Pre 28.71 %          Scores of 19 and below usually indicate a poorer quality of life in these areas.  A difference of  2-3 points is a clinically meaningful difference.  A difference of 2-3 points in the total score of the Quality of Life Index has been associated with significant improvement in overall quality of life,  self-image, physical symptoms, and general health in studies assessing change in quality of life.  PHQ-9: Recent Review Flowsheet Data    Depression screen [facility] 2/9 03/05/2020 02/14/2018 11/10/2017 07/07/2016 03/31/2016   Decreased Interest 0 0 0 0 0   Down, Depressed, Hopeless 0 0 0 0 0   PHQ - 2 Score 0 0 0 0  0     Interpretation of Total Score  Total Score Depression Severity:  1-4 = Minimal depression, 5-9 = Mild depression, 10-14 = Moderate depression, 15-19 = Moderately severe depression, 20-27 = Severe depression   Psychosocial Evaluation and Intervention:   Psychosocial Re-Evaluation:  Psychosocial Re-Evaluation    Row Name 03/11/20 1003 04/02/20 0836           Psychosocial Re-Evaluation   Current issues with None Identified None Identified      Interventions Encouraged to attend Cardiac Rehabilitation for the exercise Encouraged to attend Cardiac Rehabilitation for the exercise      Continue Psychosocial Services  No Follow up required No Follow up required             Psychosocial Discharge (Final Psychosocial Re-Evaluation):  Psychosocial Re-Evaluation - 04/02/20 0836      Psychosocial Re-Evaluation   Current issues with None Identified    Interventions Encouraged to attend Cardiac Rehabilitation for the exercise    Continue Psychosocial Services  No Follow up required           Vocational Rehabilitation: Provide vocational rehab assistance to qualifying candidates.   Vocational Rehab Evaluation & Intervention:  Vocational Rehab - 03/05/20 1227      Initial Vocational Rehab Evaluation & Intervention   Assessment shows need for Vocational Rehabilitation No           Education: Education Goals: Education classes will be provided on a weekly basis, covering required topics. Participant will state understanding/return demonstration of topics presented.  Learning Barriers/Preferences:  Learning Barriers/Preferences - 03/05/20 1129      Learning  Barriers/Preferences   Learning Barriers None    Learning Preferences None           Education Topics: Hypertension, Hypertension Reduction -Define heart disease and high blood pressure. Discus how high blood pressure affects the body and ways to reduce high blood pressure.   Exercise and Your Heart -Discuss why it is important to exercise, the FITT principles of exercise, normal and abnormal responses to exercise, and how to exercise safely.   Angina -Discuss definition of angina, causes of angina, treatment of angina, and how to decrease risk of having angina.   Cardiac Medications -Review what the following cardiac medications are used for, how they affect the body, and side effects that may occur when taking the medications.  Medications include Aspirin, Beta blockers, calcium channel blockers, ACE Inhibitors, angiotensin receptor blockers, diuretics, digoxin, and antihyperlipidemics.   Congestive Heart Failure -Discuss the definition of CHF, how to live with CHF, the signs and symptoms of CHF, and how keep track of weight and sodium intake.   Heart Disease and Intimacy -Discus the effect sexual activity has on the heart, how changes occur during intimacy as we age, and safety during sexual activity.   Smoking Cessation / COPD -Discuss different methods to quit smoking, the health benefits of quitting smoking, and the definition of COPD.   Nutrition I: Fats -Discuss the types of cholesterol, what cholesterol does to the heart, and how cholesterol levels can be controlled.   Nutrition II: Labels -Discuss the different components of food labels and how to read food label   Heart Parts/Heart Disease and PAD -Discuss the anatomy of the heart, the pathway of blood circulation through the heart, and these are affected by heart disease.   Stress I: Signs and Symptoms -Discuss the causes of stress, how stress may lead to anxiety and depression, and ways to limit  stress.  Stress II: Relaxation -Discuss different types of relaxation techniques to limit stress.   Warning Signs of Stroke / TIA -Discuss definition of a stroke, what the signs and symptoms are of a stroke, and how to identify when someone is having stroke.   Knowledge Questionnaire Score:  Knowledge Questionnaire Score - 03/11/20 1027      Knowledge Questionnaire Score   Pre Score 23/24           Core Components/Risk Factors/Patient Goals at Admission:  Personal Goals and Risk Factors at Admission - 03/05/20 1130      Core Components/Risk Factors/Patient Goals on Admission    Weight Management Weight Maintenance    Lipids Yes    Intervention Provide education and support for participant on nutrition & aerobic/resistive exercise along with prescribed medications to achieve LDL <71m, HDL >451m    Expected Outcomes Short Term: Participant states understanding of desired cholesterol values and is compliant with medications prescribed. Participant is following exercise prescription and nutrition guidelines.;Long Term: Cholesterol controlled with medications as prescribed, with individualized exercise RX and with personalized nutrition plan. Value goals: LDL < 7050mHDL > 40 mg.           Core Components/Risk Factors/Patient Goals Review:   Goals and Risk Factor Review    Row Name 03/11/20 1004 04/02/20 0836           Core Components/Risk Factors/Patient Goals Review   Personal Goals Review Lipids;Weight Management/Obesity Lipids;Weight Management/Obesity      Review LauNastasiaarted exercise on 03/11/20 and did well with exercise. Wonder's vital signs were stable LauCattleyas been doing well with exercise. Talasia's vital signs have been stable.      Expected Outcomes LauShiloll continue to exercise at cardiac rehab for exercise, nutrition and life style modifications LauRajahll continue to exercise at cardiac rehab for exercise, nutrition and life style modifications              Core Components/Risk Factors/Patient Goals at Discharge (Final Review):   Goals and Risk Factor Review - 04/02/20 0836      Core Components/Risk Factors/Patient Goals Review   Personal Goals Review Lipids;Weight Management/Obesity    Review LauBecketts been doing well with exercise. Kita's vital signs have been stable.    Expected Outcomes LauShirlall continue to exercise at cardiac rehab for exercise, nutrition and life style modifications           ITP Comments:  ITP Comments    Row Name 03/05/20 0946 03/11/20 1007 04/02/20 0835       ITP Comments Dr. TraFransico Himdical Director Cardiac Rehab [place] 30 Day ITP Review. LauAokiarted exercise on 03/11/20. LauChimamandad well with exercise. LauAnyely vital signs were stable. 30 Day ITP Review. LauDevettas good attendance and participation in phase 2 cardiac rehab.            Comments: See ITP comments.MarBarnet PallN,BSN 04/02/2020 11:45 AM

## 2020-04-03 ENCOUNTER — Encounter (HOSPITAL_COMMUNITY)
Admission: RE | Admit: 2020-04-03 | Discharge: 2020-04-03 | Disposition: A | Discharge status: Home / Self Care | Payer: Medicare Other | Source: Ambulatory Visit | Attending: Cardiology | Admitting: Cardiology

## 2020-04-03 ENCOUNTER — Other Ambulatory Visit: Payer: Self-pay

## 2020-04-03 DIAGNOSIS — Z952 Presence of prosthetic heart valve: Secondary | ICD-10-CM | POA: Diagnosis not present

## 2020-04-05 ENCOUNTER — Encounter (HOSPITAL_COMMUNITY): Payer: Medicare Other

## 2020-04-08 ENCOUNTER — Other Ambulatory Visit: Payer: Self-pay

## 2020-04-08 ENCOUNTER — Encounter (HOSPITAL_COMMUNITY)
Admission: RE | Admit: 2020-04-08 | Discharge: 2020-04-08 | Disposition: A | Discharge status: Home / Self Care | Payer: Medicare Other | Source: Ambulatory Visit | Attending: Cardiology | Admitting: Cardiology

## 2020-04-08 DIAGNOSIS — Z952 Presence of prosthetic heart valve: Secondary | ICD-10-CM
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
## 2020-04-10 ENCOUNTER — Encounter (HOSPITAL_COMMUNITY): Payer: Medicare Other

## 2020-04-15 ENCOUNTER — Encounter (HOSPITAL_COMMUNITY): Payer: Medicare Other

## 2020-04-16 ENCOUNTER — Other Ambulatory Visit: Payer: Self-pay | Admitting: *Deleted

## 2020-04-16 MED ORDER — MELOXICAM 15 MG PO TABS
15.0000 mg | ORAL_TABLET | Freq: Every day | ORAL | 0 refills | Status: DC
Start: 2020-04-16 — End: 2020-07-04

## 2020-04-17 ENCOUNTER — Encounter (HOSPITAL_COMMUNITY): Payer: Medicare Other

## 2020-04-22 ENCOUNTER — Telehealth (HOSPITAL_COMMUNITY): Payer: Self-pay | Admitting: *Deleted

## 2020-04-22 ENCOUNTER — Encounter (HOSPITAL_COMMUNITY): Payer: Medicare Other

## 2020-04-22 NOTE — Telephone Encounter (Signed)
Message left on voicemail.  Pt slipped and fell this morning her sidewalk as she was walking to her car to come today for Cardiac rehab.  Pt denied any apparent injury and plan to come back to exercise on Wednesday. Brittany Whitney, BSN Cardiac and Training and development officer

## 2020-04-24 ENCOUNTER — Other Ambulatory Visit: Payer: Self-pay

## 2020-04-24 ENCOUNTER — Encounter (HOSPITAL_COMMUNITY)
Admission: RE | Admit: 2020-04-24 | Discharge: 2020-04-24 | Disposition: A | Discharge status: Home / Self Care | Payer: Medicare Other | Source: Ambulatory Visit | Attending: Cardiology | Admitting: Cardiology

## 2020-04-24 DIAGNOSIS — I359 Nonrheumatic aortic valve disorder, unspecified: Secondary | ICD-10-CM | POA: Diagnosis not present

## 2020-04-24 DIAGNOSIS — Z9889 Other specified postprocedural states: Secondary | ICD-10-CM | POA: Insufficient documentation

## 2020-04-24 DIAGNOSIS — Z952 Presence of prosthetic heart valve: Secondary | ICD-10-CM | POA: Diagnosis not present

## 2020-04-24 DIAGNOSIS — R0600 Dyspnea, unspecified: Secondary | ICD-10-CM | POA: Insufficient documentation

## 2020-04-26 ENCOUNTER — Encounter (HOSPITAL_COMMUNITY)
Admission: RE | Admit: 2020-04-26 | Discharge: 2020-04-26 | Disposition: A | Discharge status: Home / Self Care | Payer: Medicare Other | Source: Ambulatory Visit | Attending: Cardiology | Admitting: Cardiology

## 2020-04-26 ENCOUNTER — Other Ambulatory Visit: Payer: Self-pay

## 2020-04-26 DIAGNOSIS — Z952 Presence of prosthetic heart valve: Secondary | ICD-10-CM | POA: Diagnosis not present

## 2020-04-26 NOTE — Progress Notes (Signed)
Cardiac Individual Treatment Plan  Patient Details  Name: Brittany Whitney MRN: [id number] Date of Birth: [birth year] Referring Provider:   Flowsheet Row CARDIAC REHAB PHASE II ORIENTATION from 03/05/2020 in [facility]  Referring Provider Adrian Prows, MD      Initial Encounter Date:  [place] from 03/05/2020 in [place]  Date 03/05/20      Visit Diagnosis: S/P AVR (aortic valve replacement)  Patient's Home Medications on Admission:  Current Outpatient Medications:    acetaminophen (TYLENOL) 500 MG tablet, Take 500 mg by mouth. , Disp: , Rfl:    ALPRAZolam (XANAX) 0.25 MG tablet, Take 0.25 mg by mouth at bedtime as needed for anxiety. , Disp: , Rfl:    aspirin EC 81 MG tablet, Take 81 mg by mouth daily. Swallow whole. , Disp: , Rfl:    benzonatate (TESSALON PERLES) 100 MG capsule, Take 1 capsule (100 mg total) by mouth 3 (three) times daily as needed for cough., Disp: 20 capsule, Rfl: 0   Biotin 5000 MCG TABS, Take 1 tablet by mouth daily. , Disp: , Rfl:    Bismuth Subgallate (DEVROM) 200 MG CHEW, Chew 1 tablet by mouth 2 (two) times daily. , Disp: , Rfl:    cyclobenzaprine (FLEXERIL) 5 MG tablet, Take 5 mg by mouth daily. One tablet at bedtime. , Disp: , Rfl:    DHEA 10 MG CAPS, Take 1 capsule by mouth every morning. , Disp: , Rfl:    Diindolylmethane POWD, Take 1 capsule by mouth 2 (two) times daily. 100 mg capsule , Disp: , Rfl:    diltiazem (CARDIZEM CD) 120 MG 24 hr capsule, Take 1 capsule (120 mg total) by mouth daily., Disp: 30 capsule, Rfl: 3   doxycycline (PERIOSTAT) 20 MG tablet, Take 20 mg by mouth 2 (two) times daily. , Disp: , Rfl:    guaiFENesin-codeine 100-10 MG/5ML syrup, Take 10 mLs by mouth every 6 (six) hours as needed for cough., Disp: 120 mL, Rfl: 1   ibuprofen (ADVIL,MOTRIN) 200 MG tablet, Take 200 mg by mouth every 6 (six) hours as needed. , Disp: ,  Rfl:    icosapent Ethyl (VASCEPA) 1 g capsule, Take 2 capsules (2 g total) by mouth 2 (two) times daily., Disp: 120 capsule, Rfl: 6   levothyroxine (SYNTHROID, LEVOTHROID) 100 MCG tablet, Take 137 mcg by mouth daily before breakfast. , Disp: , Rfl:    MAGNESIUM GLYCINATE PO, Take 200 mg by mouth. One tablet at bedtime. , Disp: , Rfl:    MAGNESIUM MALATE PO, Take 141 mg by mouth. One tablet every morning. , Disp: , Rfl:    meloxicam (MOBIC) 15 MG tablet, Take 1 tablet (15 mg total) by mouth daily., Disp: 60 tablet, Rfl: 0   Menatetrenone (VITAMIN K2) 100 MCG TABS, Take 100 mcg by mouth daily. , Disp: , Rfl:    naratriptan (AMERGE) 2.5 MG tablet, Take 2.5 mg by mouth as needed. Take one (1) tablet at onset of headache; if returns or does not resolve, may repeat after 4 hours; do not exceed five (5) mg in 24 hours. , Disp: , Rfl:    predniSONE (DELTASONE) 5 MG tablet, Take 5 mg by mouth every other day. TAKES IN AM, Disp: , Rfl:    progesterone (PROMETRIUM) 100 MG capsule, Take 125 mg by mouth at bedtime. COMPOUND MEDICATION IN PILL FORM, Disp: , Rfl:    rosuvastatin (CRESTOR) 5 MG tablet,  Take 5 mg by mouth once a week. One tablet once a week , Disp: , Rfl:    sulfaSALAzine (AZULFIDINE) 500 MG tablet, Take 1,000 mg by mouth 2 (two) times daily. , Disp: , Rfl:    SUMAtriptan (IMITREX) 50 MG tablet, Take 50 mg by mouth every 2 (two) hours as needed. , Disp: , Rfl:    traMADol (ULTRAM) 50 MG tablet, Take 1 tablet (50 mg total) by mouth 2 (two) times daily., Disp: 60 tablet, Rfl: 1   Vitamin D, Ergocalciferol, (DRISDOL) 50000 UNITS CAPS, Take by mouth every 14 (fourteen) days. , Disp: , Rfl:    zolpidem (AMBIEN) 10 MG tablet, Take 10 mg by mouth at bedtime. for sleep, Disp: , Rfl: 3  Past Medical History: Past Medical History:  Diagnosis Date   Arthritis    Crohn's colitis ([place])    Endometrial polyp    History of exercise stress test 12-25-2016  by dr Einar Gip   no evidence  ischemia, exercise tolerence excellent, no significant arrhythmias   History of malignant melanoma of skin    2005  post excision back area-- localized   Hypothyroidism    Ileostomy in place [facility])    for crohn's colitis -- external pouch   Migraines    Severe aortic valve regurgitation cardiologist-  dr Einar Gip--  dx by echo 2017   echo 06/ 10/2016 no change except noted mod. to sev. pulmonry htn/  verified by cardiac cath 07/ 2018 showed no evidence pulmonary htn   Vitamin D deficiency     Tobacco Use: Social History   Tobacco Use  Smoking Status Never Smoker  Smokeless Tobacco Never Used    Labs: Recent Review Flowsheet Data    Labs for ITP Cardiac and Pulmonary Rehab Latest Ref Rng & Units 10/27/2016 12/12/2019 12/12/2019 12/12/2019 12/12/2019   PHART 7.350 - 7.450 7.579(H) - - - 7.385   PCO2ART 32.0 - 48.0 mmHg 22.0(L) - - - 38.3   HCO3 20.0 - 28.0 mmol/L 20.6 23.4 23.4 22.2 23.0   TCO2 22 - 32 mmol/L 21 25 25 23 24    ACIDBASEDEF 0.0 - 2.0 mmol/L - 2.0 2.0 3.0(H) 2.0   O2SAT % 99.0 77.0 74.0 79.0 99.0      Capillary Blood Glucose: No results found for: GLUCAP   Exercise Target Goals: Exercise Program Goal: Individual exercise prescription set using results from initial 6 min walk test and THRR while considering  patients activity barriers and safety.   Exercise Prescription Goal: Starting with aerobic activity 30 plus minutes a day, 3 days per week for initial exercise prescription. Provide home exercise prescription and guidelines that participant acknowledges understanding prior to discharge.  Activity Barriers & Risk Stratification:  Activity Barriers & Cardiac Risk Stratification - 03/05/20 1133      Activity Barriers & Cardiac Risk Stratification   Activity Barriers None    Cardiac Risk Stratification High           6 Minute Walk:  6 Minute Walk    Row Name 03/05/20 1006 04/24/20 0916       6 Minute Walk   Phase Initial Discharge    Distance 1732  feet 2015 feet    Distance % Change -- 16.34 %    Distance Feet Change -- 317 ft    Walk Time 6 minutes 6 minutes    # of Rest Breaks 0 0    MPH 3.28 3.82    METS 4.39 4.95    RPE 13  12    Perceived Dyspnea  0 0    VO2 Peak 15.39 17.34    Symptoms No No    Resting HR 90 bpm 92 bpm    Resting BP 108/70 126/70    Resting Oxygen Saturation  99 % 98 %    Exercise Oxygen Saturation  during 6 min walk 97 % 98 %    Max Ex. HR 115 bpm 115 bpm    Max Ex. BP 110/64 118/64    2 Minute Post BP 96/64 110/64           Oxygen Initial Assessment:   Oxygen Re-Evaluation:   Oxygen Discharge (Final Oxygen Re-Evaluation):   Initial Exercise Prescription:  Initial Exercise Prescription - 03/05/20 1100      Date of Initial Exercise RX and Referring Provider   Date 03/05/20    Referring Provider Adrian Prows, MD    Expected Discharge Date 05/03/20      Treadmill   MPH 3    Grade 1    Minutes 15    METs 3.71      NuStep   Level 2    SPM 85    Minutes 15    METs 2           Perform Capillary Blood Glucose checks as needed.  Exercise Prescription Changes:   Exercise Prescription Changes    Row Name 03/11/20 1000 03/20/20 1056 04/01/20 1050 04/24/20 1045       Response to Exercise   Blood Pressure (Admit) 110/72 100/64 102/72 126/70    Blood Pressure (Exercise) 114/68 108/70 100/70 118/64    Blood Pressure (Exit) 112/70 98/68 100/62 110/64    Heart Rate (Admit) 102 bpm 99 bpm 93 bpm 92 bpm    Heart Rate (Exercise) 111 bpm 116 bpm 114 bpm 115 bpm    Heart Rate (Exit) 101 bpm 97 bpm 93 bpm 90 bpm    Rating of Perceived Exertion (Exercise) 11 11 11 12     Symptoms None None None None    Comments Pt's first day of exercise in the CRP2 program Revieviewed METs and home exercise Rx Reviewed METs Reviewed Goals/Performed 6 minute walk test    Duration Progress to 30 minutes of  aerobic without signs/symptoms of physical distress Continue with 30 min of aerobic exercise without  signs/symptoms of physical distress. Continue with 30 min of aerobic exercise without signs/symptoms of physical distress. Continue with 30 min of aerobic exercise without signs/symptoms of physical distress.    Intensity THRR unchanged THRR unchanged THRR unchanged THRR unchanged         Progression   Progression Continue to progress workloads to maintain intensity without signs/symptoms of physical distress. Continue to progress workloads to maintain intensity without signs/symptoms of physical distress. Continue to progress workloads to maintain intensity without signs/symptoms of physical distress. Continue to progress workloads to maintain intensity without signs/symptoms of physical distress.    Average METs 2.7 2.9 2.9 3.5         Resistance Training   Weight 2 --  No weights done on wednesdays 3 lbs No weights on wednesdays    Reps 10-15 -- 10-15 --    Time 10 Minutes -- 10 Minutes --         Interval Training   Interval Training No No No No         Treadmill   MPH 2.5 2.5 2.5 3    Grade 0 0 0 0.5    Minutes  11 11 11 15     METs 2.91 2.91 2.91 3.5         NuStep   Level 2 2 2 4     SPM 85 85 85 85    Minutes 20 15 15 15     METs 2.5 2.9 2.9 3         Track   Laps -- -- -- 10    Minutes -- -- -- 6    METs -- -- -- 3.92         Home Exercise Plan   Plans to continue exercise at -- Home (comment) Home (comment) Home (comment)    Frequency -- Add 2 additional days to program exercise sessions. Add 2 additional days to program exercise sessions. Add 2 additional days to program exercise sessions.    Initial Home Exercises Provided -- 03/20/20 03/20/20 03/20/20           Exercise Comments:   Exercise Comments    Row Name 03/11/20 1035 03/20/20 1103 04/02/20 0902 04/30/20 0844     Exercise Comments Pt's first day of exericise in the CRP2 program. Pt tolerated session well with no complaints. Reviewed home exercise Rx today with patient. Pt will contiue to walk at  home 2-3 x/week for 30-45 minutes. Reviewed METS and they are at a plateau. Pt agrees to make increases in the workloads next session. Continues to walk at home. Reviewed Goals. Making good progress in the CRP2 program.           Exercise Goals and Review:   Exercise Goals    Row Name 03/05/20 1133             Exercise Goals   Increase Physical Activity Yes       Intervention Provide advice, education, support and counseling about physical activity/exercise needs.;Develop an individualized exercise prescription for aerobic and resistive training based on initial evaluation findings, risk stratification, comorbidities and participant's personal goals.       Expected Outcomes Short Term: Attend rehab on a regular basis to increase amount of physical activity.;Long Term: Add in home exercise to make exercise part of routine and to increase amount of physical activity.;Long Term: Exercising regularly at least 3-5 days a week.       Increase Strength and Stamina Yes       Intervention Provide advice, education, support and counseling about physical activity/exercise needs.;Develop an individualized exercise prescription for aerobic and resistive training based on initial evaluation findings, risk stratification, comorbidities and participant's personal goals.       Expected Outcomes Short Term: Increase workloads from initial exercise prescription for resistance, speed, and METs.;Short Term: Perform resistance training exercises routinely during rehab and add in resistance training at home;Long Term: Improve cardiorespiratory fitness, muscular endurance and strength as measured by increased METs and functional capacity (6MWT)       Able to understand and use rate of perceived exertion (RPE) scale Yes       Intervention Provide education and explanation on how to use RPE scale       Expected Outcomes Short Term: Able to use RPE daily in rehab to express subjective intensity level;Long Term:  Able  to use RPE to guide intensity level when exercising independently       Knowledge and understanding of Target Heart Rate Range (THRR) Yes       Intervention Provide education and explanation of THRR including how the numbers were predicted and where they are located for reference  Expected Outcomes Short Term: Able to state/look up THRR;Short Term: Able to use daily as guideline for intensity in rehab       Understanding of Exercise Prescription Yes       Intervention Provide education, explanation, and written materials on patient's individual exercise prescription       Expected Outcomes Short Term: Able to explain program exercise prescription;Long Term: Able to explain home exercise prescription to exercise independently              Exercise Goals Re-Evaluation :  Exercise Goals Re-Evaluation    Row Name 03/11/20 1033 03/20/20 1100 04/24/20 1045         Exercise Goal Re-Evaluation   Exercise Goals Review Increase Physical Activity;Increase Strength and Stamina;Able to understand and use rate of perceived exertion (RPE) scale;Knowledge and understanding of Target Heart Rate Range (THRR);Understanding of Exercise Prescription Increase Physical Activity;Increase Strength and Stamina;Able to understand and use rate of perceived exertion (RPE) scale;Knowledge and understanding of Target Heart Rate Range (THRR);Able to check pulse independently;Understanding of Exercise Prescription Increase Physical Activity;Increase Strength and Stamina;Able to understand and use rate of perceived exertion (RPE) scale;Knowledge and understanding of Target Heart Rate Range (THRR);Able to check pulse independently;Understanding of Exercise Prescription     Comments Pt's first day of exercsie in the CRP2 program. Pt tolerated the session well and understands the exercise Rx, THRR, and RPE scale. Reviewed METs and home exercise Rx. Pt is exercising at home 2-3x/week by walking. Provided copy of home exercise Rx  and pt verbalized understnading. Reviewed goals and performed 6 minute walk test. Pt returning after 2 week absence due to the holidays. Pt has made good progress in the CRP2 program. Pt contiues to walk at home.     Expected Outcomes Will continue to monitor and progress patient's workloads as tolerated. Pt will continue to walk at home 2-3 x/week in addtion to the CRP2 program 3x/week. WIll continue to progress and monitor as tolerated. Pt is scheduled to graduatre the CRP2 program on 05/03/2020.             Discharge Exercise Prescription (Final Exercise Prescription Changes):  Exercise Prescription Changes - 04/24/20 1045      Response to Exercise   Blood Pressure (Admit) 126/70    Blood Pressure (Exercise) 118/64    Blood Pressure (Exit) 110/64    Heart Rate (Admit) 92 bpm    Heart Rate (Exercise) 115 bpm    Heart Rate (Exit) 90 bpm    Rating of Perceived Exertion (Exercise) 12    Symptoms None    Comments Reviewed Goals/Performed 6 minute walk test    Duration Continue with 30 min of aerobic exercise without signs/symptoms of physical distress.    Intensity THRR unchanged      Progression   Progression Continue to progress workloads to maintain intensity without signs/symptoms of physical distress.    Average METs 3.5      Resistance Training   Weight No weights on wednesdays      Interval Training   Interval Training No      Treadmill   MPH 3    Grade 0.5    Minutes 15    METs 3.5      NuStep   Level 4    SPM 85    Minutes 15    METs 3      Track   Laps 10    Minutes 6    METs 3.92  Home Exercise Plan   Plans to continue exercise at Home (comment)    Frequency Add 2 additional days to program exercise sessions.    Initial Home Exercises Provided 03/20/20           Nutrition:  Target Goals: Understanding of nutrition guidelines, daily intake of sodium <1529m, cholesterol <2017m calories 30% from fat and 7% or less from saturated fats, daily to  have 5 or more servings of fruits and vegetables.  Biometrics:  Pre Biometrics - 03/05/20 0946      Pre Biometrics   Waist Circumference 29 inches    Hip Circumference 36 inches    Waist to Hip Ratio 0.81 %    Triceps Skinfold 11 mm    % Body Fat 27 %    Grip Strength 26 kg    Flexibility 18.5 in    Single Leg Stand 30 seconds            Nutrition Therapy Plan and Nutrition Goals:  Nutrition Therapy & Goals - 04/02/20 0729      Nutrition Therapy   Diet General, healthful      Personal Nutrition Goals   Nutrition Goal Continue following a healthy diet           Nutrition Assessments:  MEDIFICTS Score Key:  ?70 Need to make dietary changes   40-70 Heart Healthy Diet  ? 40 Therapeutic Level Cholesterol Diet   Picture Your Plate Scores:  <4<31nhealthy dietary pattern with much room for improvement.  41-50 Dietary pattern unlikely to meet recommendations for good health and room for improvement.  51-60 More healthful dietary pattern, with some room for improvement.   >60 Healthy dietary pattern, although there may be some specific behaviors that could be improved.    Nutrition Goals Re-Evaluation:  Nutrition Goals Re-Evaluation    [place] 04/02/20 0734 04/29/20 1407           Goals   Current Weight 120 lb 5.9 oz (54.6 kg) 120 lb 5.9 oz (54.6 kg)      Nutrition Goal Continue following a healthy diet Continue following a healthy diet             Nutrition Goals Discharge (Final Nutrition Goals Re-Evaluation):  Nutrition Goals Re-Evaluation - 04/29/20 1407      Goals   Current Weight 120 lb 5.9 oz (54.6 kg)    Nutrition Goal Continue following a healthy diet           Psychosocial: Target Goals: Acknowledge presence or absence of significant depression and/or stress, maximize coping skills, provide positive support system. Participant is able to verbalize types and ability to use techniques and skills needed for reducing stress and  depression.  Initial Review & Psychosocial Screening:  Initial Psych Review & Screening - 03/05/20 1225      Initial Review   Current issues with None Identified      Family Dynamics   Good Support System? Yes    Comments Ms. IrRudieresents to cardiac rehab orientation with a positive attitude and outlook. Denies any psychosocial barriers to self health management or participation in cardiac rehab. She is married with adult children and surrounded by a large support system. No psychosocial barriers identified. No interventions needed.      Barriers   Psychosocial barriers to participate in program There are no identifiable barriers or psychosocial needs.      Screening Interventions   Interventions Encouraged to exercise  Quality of Life Scores:  Quality of Life - 03/11/20 1025      Quality of Life   Select Quality of Life      Quality of Life Scores   Health/Function Pre 27.57 %    Socioeconomic Pre 30 %    Psych/Spiritual Pre 30 %    Psych/Spiritual Post --    Psych/Spiritual % Change --    Family Pre 28.8 %    GLOBAL Pre 28.71 %          Scores of 19 and below usually indicate a poorer quality of life in these areas.  A difference of  2-3 points is a clinically meaningful difference.  A difference of 2-3 points in the total score of the Quality of Life Index has been associated with significant improvement in overall quality of life, self-image, physical symptoms, and general health in studies assessing change in quality of life.  PHQ-9: Recent Review Flowsheet Data    Depression screen [facility] 2/9 03/05/2020 02/14/2018 11/10/2017 07/07/2016 03/31/2016   Decreased Interest 0 0 0 0 0   Down, Depressed, Hopeless 0 0 0 0 0   PHQ - 2 Score 0 0 0 0 0     Interpretation of Total Score  Total Score Depression Severity:  1-4 = Minimal depression, 5-9 = Mild depression, 10-14 = Moderate depression, 15-19 = Moderately severe depression, 20-27 = Severe depression    Psychosocial Evaluation and Intervention:   Psychosocial Re-Evaluation:  Psychosocial Re-Evaluation    [place] Name 03/11/20 1003 04/02/20 0836 04/25/20 1550         Psychosocial Re-Evaluation   Current issues with None Identified None Identified Current Stress Concerns     Comments -- -- Tristian's sister is crtitically ill in [place] in the hospital     Interventions Encouraged to attend Cardiac Rehabilitation for the exercise Encouraged to attend Cardiac Rehabilitation for the exercise Encouraged to attend Cardiac Rehabilitation for the exercise;Stress management education     Continue Psychosocial Services  No Follow up required No Follow up required Follow up required by staff     Comments -- -- Will follow up and offer support as needed           Initial Review   Source of Stress Concerns -- -- Family            Psychosocial Discharge (Final Psychosocial Re-Evaluation):  Psychosocial Re-Evaluation - 04/25/20 1550      Psychosocial Re-Evaluation   Current issues with Current Stress Concerns    Comments Remonia's sister is crtitically ill in [place] in the hospital    Interventions Encouraged to attend Cardiac Rehabilitation for the exercise;Stress management education    Continue Psychosocial Services  Follow up required by staff    Comments Will follow up and offer support as needed      Initial Review   Source of Stress Concerns Family           Vocational Rehabilitation: Provide vocational rehab assistance to qualifying candidates.   Vocational Rehab Evaluation & Intervention:  Vocational Rehab - 03/05/20 1227      Initial Vocational Rehab Evaluation & Intervention   Assessment shows need for Vocational Rehabilitation No           Education: Education Goals: Education classes will be provided on a weekly basis, covering required topics. Participant will state understanding/return demonstration of topics presented.  Learning Barriers/Preferences:  Learning  Barriers/Preferences - 03/05/20 1129      Learning Barriers/Preferences  Learning Barriers None    Learning Preferences None           Education Topics: Hypertension, Hypertension Reduction -Define heart disease and high blood pressure. Discus how high blood pressure affects the body and ways to reduce high blood pressure.   Exercise and Your Heart -Discuss why it is important to exercise, the FITT principles of exercise, normal and abnormal responses to exercise, and how to exercise safely.   Angina -Discuss definition of angina, causes of angina, treatment of angina, and how to decrease risk of having angina.   Cardiac Medications -Review what the following cardiac medications are used for, how they affect the body, and side effects that may occur when taking the medications.  Medications include Aspirin, Beta blockers, calcium channel blockers, ACE Inhibitors, angiotensin receptor blockers, diuretics, digoxin, and antihyperlipidemics.   Congestive Heart Failure -Discuss the definition of CHF, how to live with CHF, the signs and symptoms of CHF, and how keep track of weight and sodium intake.   Heart Disease and Intimacy -Discus the effect sexual activity has on the heart, how changes occur during intimacy as we age, and safety during sexual activity.   Smoking Cessation / COPD -Discuss different methods to quit smoking, the health benefits of quitting smoking, and the definition of COPD.   Nutrition I: Fats -Discuss the types of cholesterol, what cholesterol does to the heart, and how cholesterol levels can be controlled.   Nutrition II: Labels -Discuss the different components of food labels and how to read food label   Heart Parts/Heart Disease and PAD -Discuss the anatomy of the heart, the pathway of blood circulation through the heart, and these are affected by heart disease.   Stress I: Signs and Symptoms -Discuss the causes of stress, how stress may lead  to anxiety and depression, and ways to limit stress.   Stress II: Relaxation -Discuss different types of relaxation techniques to limit stress.   Warning Signs of Stroke / TIA -Discuss definition of a stroke, what the signs and symptoms are of a stroke, and how to identify when someone is having stroke.   Knowledge Questionnaire Score:  Knowledge Questionnaire Score - 03/11/20 1027      Knowledge Questionnaire Score   Pre Score 23/24           Core Components/Risk Factors/Patient Goals at Admission:  Personal Goals and Risk Factors at Admission - 03/05/20 1130      Core Components/Risk Factors/Patient Goals on Admission    Weight Management Weight Maintenance    Lipids Yes    Intervention Provide education and support for participant on nutrition & aerobic/resistive exercise along with prescribed medications to achieve LDL <79m, HDL >451m    Expected Outcomes Short Term: Participant states understanding of desired cholesterol values and is compliant with medications prescribed. Participant is following exercise prescription and nutrition guidelines.;Long Term: Cholesterol controlled with medications as prescribed, with individualized exercise RX and with personalized nutrition plan. Value goals: LDL < 7018mHDL > 40 mg.           Core Components/Risk Factors/Patient Goals Review:   Goals and Risk Factor Review    Row Name 03/11/20 1004 04/02/20 0836 04/25/20 1552         Core Components/Risk Factors/Patient Goals Review   Personal Goals Review Lipids;Weight Management/Obesity Lipids;Weight Management/Obesity Lipids;Weight Management/Obesity     Review LauAdrienearted exercise on 03/11/20 and did well with exercise. Ishana's vital signs were stable LauDustines been doing well with exercise.  Lianny's vital signs have been stable. Jazlene has been doing well with exercise. Tiamarie's vital signs have been stable. Linnette will complete cardiac rehab next week.     Expected Outcomes Allyne  will continue to exercise at cardiac rehab for exercise, nutrition and life style modifications Destiny will continue to exercise at cardiac rehab for exercise, nutrition and life style modifications Dawnisha will continue to exercise at cardiac rehab for exercise, nutrition and life style modifications            Core Components/Risk Factors/Patient Goals at Discharge (Final Review):   Goals and Risk Factor Review - 04/25/20 1552      Core Components/Risk Factors/Patient Goals Review   Personal Goals Review Lipids;Weight Management/Obesity    Review Timica has been doing well with exercise. Bertina's vital signs have been stable. Rand will complete cardiac rehab next week.    Expected Outcomes Reigan will continue to exercise at cardiac rehab for exercise, nutrition and life style modifications           ITP Comments:  ITP Comments    Row Name 03/05/20 0946 03/11/20 1007 04/02/20 0835 04/25/20 1549     ITP Comments Dr. Fransico Him Medical Director Cardiac Re[facility] [place] 30 Day ITP Review. Stormi started exercise on 03/11/20. Anyla did well with exercise. Joniece 's vital signs were stable. 30 Day ITP Review. Arbell has good attendance and participation in phase 2 cardiac rehab. 30 Day ITP Review. Mauriana has good attendance and participation in phase 2 cardiac rehab.           Comments: See ITP comments. Tamorah completes cardiac rehab on 05/04/19.Barnet Pall, RN,BSN 04/30/2020 2:30 PM

## 2020-04-29 ENCOUNTER — Other Ambulatory Visit: Payer: Self-pay

## 2020-04-29 ENCOUNTER — Encounter (HOSPITAL_COMMUNITY)
Admission: RE | Admit: 2020-04-29 | Discharge: 2020-04-29 | Disposition: A | Discharge status: Home / Self Care | Payer: Medicare Other | Source: Ambulatory Visit | Attending: Cardiology | Admitting: Cardiology

## 2020-04-29 DIAGNOSIS — Z952 Presence of prosthetic heart valve: Secondary | ICD-10-CM | POA: Diagnosis not present

## 2020-05-01 ENCOUNTER — Encounter (HOSPITAL_COMMUNITY)
Admission: RE | Admit: 2020-05-01 | Discharge: 2020-05-01 | Disposition: A | Discharge status: Home / Self Care | Payer: Medicare Other | Source: Ambulatory Visit | Attending: Cardiology | Admitting: Cardiology

## 2020-05-01 ENCOUNTER — Other Ambulatory Visit: Payer: Self-pay

## 2020-05-01 VITALS — Ht 67.0 in | Wt 119.3 lb

## 2020-05-01 DIAGNOSIS — Z952 Presence of prosthetic heart valve: Secondary | ICD-10-CM | POA: Diagnosis not present

## 2020-05-03 ENCOUNTER — Encounter (HOSPITAL_COMMUNITY): Payer: Medicare Other

## 2020-05-03 ENCOUNTER — Telehealth (HOSPITAL_COMMUNITY): Payer: Self-pay | Admitting: *Deleted

## 2020-05-03 NOTE — Telephone Encounter (Signed)
Spoke with Brittany Whitney. Brittany Whitney flew up to [place] last night to be with her sister as she is in the hospital. Brittany Whitney would have completed cardiac rehab today. Brittany Whitney plans to drop her access badge by one day soon.Barnet Pall, RN,BSN 05/03/2020 9:44 AM

## 2020-05-03 NOTE — Progress Notes (Signed)
Discharge Progress Report  Patient Details  Name: Brittany Whitney MRN: [id number] Date of Birth: [birth year] Referring Provider:   Flowsheet Row CARDIAC REHAB PHASE II ORIENTATION from 03/05/2020 in [facility]  Referring Provider Adrian Prows, MD       Number of Visits: 12  Reason for Discharge:  Patient reached a stable level of exercise. Patient independent in their exercise. Patient has met program and personal goals.  Smoking History:  Social History   Tobacco Use  Smoking Status Never Smoker  Smokeless Tobacco Never Used    Diagnosis:  S/P AVR (aortic valve replacement)  ADL UCSD:   Initial Exercise Prescription:  Initial Exercise Prescription - 03/05/20 1100      Date of Initial Exercise RX and Referring Provider   Date 03/05/20    Referring Provider Adrian Prows, MD    Expected Discharge Date 05/03/20      Treadmill   MPH 3    Grade 1    Minutes 15    METs 3.71      NuStep   Level 2    SPM 85    Minutes 15    METs 2           Discharge Exercise Prescription (Final Exercise Prescription Changes):  Exercise Prescription Changes - 05/01/20 1107      Response to Exercise   Blood Pressure (Admit) 116/70    Blood Pressure (Exercise) 114/60    Blood Pressure (Exit) 102/56    Heart Rate (Admit) 96 bpm    Heart Rate (Exercise) 110 bpm    Heart Rate (Exit) 95 bpm    Rating of Perceived Exertion (Exercise) 12    Symptoms None    Comments Pt graduated from the CRP2 program today    Duration Progress to 30 minutes of  aerobic without signs/symptoms of physical distress    Intensity THRR unchanged      Progression   Progression Continue to progress workloads to maintain intensity without signs/symptoms of physical distress.    Average METs 4.1      Resistance Training   Weight No weights on wednesdays      Interval Training   Interval Training No      Treadmill   MPH 3    Grade 2    Minutes 4.12    METs 4.12       NuStep   Level 4    SPM 85    Minutes 15    METs 2.1      Track   Laps 10    Minutes 17    METs 3.96      Home Exercise Plan   Plans to continue exercise at Home (comment)    Frequency Add 2 additional days to program exercise sessions.    Initial Home Exercises Provided 03/20/20           Functional Capacity:  6 Minute Walk    Row Name 03/05/20 1006 04/24/20 0916       6 Minute Walk   Phase Initial Discharge    Distance 1732 feet 2015 feet    Distance % Change - 16.34 %    Distance Feet Change - 317 ft    Walk Time 6 minutes 6 minutes    # of Rest Breaks 0 0    MPH 3.28 3.82    METS 4.39 4.95    RPE 13 12    Perceived Dyspnea  0 0  VO2 Peak 15.39 17.34    Symptoms No No    Resting HR 90 bpm 92 bpm    Resting BP 108/70 126/70    Resting Oxygen Saturation  99 % 98 %    Exercise Oxygen Saturation  during 6 min walk 97 % 98 %    Max Ex. HR 115 bpm 115 bpm    Max Ex. BP 110/64 118/64    2 Minute Post BP 96/64 110/64           Psychological, QOL, Others - Outcomes: PHQ 2/9: Depression screen [facility] 2/9 05/03/2020 03/05/2020 02/14/2018 11/10/2017 07/07/2016  Decreased Interest 0 0 0 0 0  Down, Depressed, Hopeless 0 0 0 0 0  PHQ - 2 Score 0 0 0 0 0  Some recent data might be hidden    Quality of Life:  Quality of Life - 03/11/20 1025      Quality of Life   Select Quality of Life      Quality of Life Scores   Health/Function Pre 27.57 %    Socioeconomic Pre 30 %    Psych/Spiritual Pre 30 %    Psych/Spiritual Post --    Psych/Spiritual % Change --    Family Pre 28.8 %    GLOBAL Pre 28.71 %           Personal Goals: Goals established at orientation with interventions provided to work toward goal.  Personal Goals and Risk Factors at Admission - 03/05/20 1130      Core Components/Risk Factors/Patient Goals on Admission    Weight Management Weight Maintenance    Lipids Yes    Intervention Provide education and support for participant on nutrition &  aerobic/resistive exercise along with prescribed medications to achieve LDL <7m, HDL >456m    Expected Outcomes Short Term: Participant states understanding of desired cholesterol values and is compliant with medications prescribed. Participant is following exercise prescription and nutrition guidelines.;Long Term: Cholesterol controlled with medications as prescribed, with individualized exercise RX and with personalized nutrition plan. Value goals: LDL < 7048mHDL > 40 mg.            Personal Goals Discharge:  Goals and Risk Factor Review    Row Name 03/11/20 1004 04/02/20 0836 04/25/20 1552         Core Components/Risk Factors/Patient Goals Review   Personal Goals Review Lipids;Weight Management/Obesity Lipids;Weight Management/Obesity Lipids;Weight Management/Obesity     Review [name] exercise on 03/11/20 and did well with exercise. Brittany Whitney's vital signs were stable Brittany Whitney been doing well with exercise. Brittany Whitney's vital signs have been stable. Brittany Whitney been doing well with exercise. Brittany Whitney's vital signs have been stable. Brittany Whitney complete cardiac rehab next week.     Expected Outcomes Brittany Whitney continue to exercise at cardiac rehab for exercise, nutrition and life style modifications Brittany Whitney continue to exercise at cardiac rehab for exercise, nutrition and life style modifications Brittany Whitney continue to exercise at cardiac rehab for exercise, nutrition and life style modifications            Exercise Goals and Review:  Exercise Goals    Row Name 03/05/20 1133             Exercise Goals   Increase Physical Activity Yes       Intervention Provide advice, education, support and counseling about physical activity/exercise needs.;Develop an individualized exercise prescription for aerobic and resistive training based on initial evaluation findings, risk stratification, comorbidities and participant's personal goals.  Expected Outcomes Short Term: Attend rehab on a  regular basis to increase amount of physical activity.;Long Term: Add in home exercise to make exercise part of routine and to increase amount of physical activity.;Long Term: Exercising regularly at least 3-5 days a week.       Increase Strength and Stamina Yes       Intervention Provide advice, education, support and counseling about physical activity/exercise needs.;Develop an individualized exercise prescription for aerobic and resistive training based on initial evaluation findings, risk stratification, comorbidities and participant's personal goals.       Expected Outcomes Short Term: Increase workloads from initial exercise prescription for resistance, speed, and METs.;Short Term: Perform resistance training exercises routinely during rehab and add in resistance training at home;Long Term: Improve cardiorespiratory fitness, muscular endurance and strength as measured by increased METs and functional capacity (6MWT)       Able to understand and use rate of perceived exertion (RPE) scale Yes       Intervention Provide education and explanation on how to use RPE scale       Expected Outcomes Short Term: Able to use RPE daily in rehab to express subjective intensity level;Long Term:  Able to use RPE to guide intensity level when exercising independently       Knowledge and understanding of Target Heart Rate Range (THRR) Yes       Intervention Provide education and explanation of THRR including how the numbers were predicted and where they are located for reference       Expected Outcomes Short Term: Able to state/look up THRR;Short Term: Able to use daily as guideline for intensity in rehab       Understanding of Exercise Prescription Yes       Intervention Provide education, explanation, and written materials on patient's individual exercise prescription       Expected Outcomes Short Term: Able to explain program exercise prescription;Long Term: Able to explain home exercise prescription to exercise  independently              Exercise Goals Re-Evaluation:  Exercise Goals Re-Evaluation    Row Name 03/11/20 1033 03/20/20 1100 04/24/20 1045 05/02/20 1138       Exercise Goal Re-Evaluation   Exercise Goals Review Increase Physical Activity;Increase Strength and Stamina;Able to understand and use rate of perceived exertion (RPE) scale;Knowledge and understanding of Target Heart Rate Range (THRR);Understanding of Exercise Prescription Increase Physical Activity;Increase Strength and Stamina;Able to understand and use rate of perceived exertion (RPE) scale;Knowledge and understanding of Target Heart Rate Range (THRR);Able to check pulse independently;Understanding of Exercise Prescription Increase Physical Activity;Increase Strength and Stamina;Able to understand and use rate of perceived exertion (RPE) scale;Knowledge and understanding of Target Heart Rate Range (THRR);Able to check pulse independently;Understanding of Exercise Prescription Increase Physical Activity;Increase Strength and Stamina;Able to understand and use rate of perceived exertion (RPE) scale;Knowledge and understanding of Target Heart Rate Range (THRR);Able to check pulse independently;Understanding of Exercise Prescription    Comments Pt's first day of exercsie in the CRP2 program. Pt tolerated the session well and understands the exercise Rx, THRR, and RPE scale. Reviewed METs and home exercise Rx. Pt is exercising at home 2-3x/week by walking. Provided copy of home exercise Rx and pt verbalized understnading. Reviewed goals and performed 6 minute walk test. Pt returning after 2 week absence due to the holidays. Pt has made good progress in the CRP2 program. Pt contiues to walk at home. PT\t graduated from the [facility] program today. Pt made  good progress in the program and paln to continue her exercise at home bt walking 5-7x/week with her husband. She also plans to return to her gym once the CVOID-19 numbers have improved.    Expected  Outcomes Will continue to monitor and progress patient's workloads as tolerated. Pt will continue to walk at home 2-3 x/week in addtion to the CRP2 program 3x/week. WIll continue to progress and monitor as tolerated. Pt is scheduled to graduatre the CRP2 program on 05/03/2020. Pt will continue to exercise at home on her own 5-7x/week for 30-45 minutes.           Nutrition & Weight - Outcomes:  Pre Biometrics - 03/05/20 0946      Pre Biometrics   Waist Circumference 29 inches    Hip Circumference 36 inches    Waist to Hip Ratio 0.81 %    Triceps Skinfold 11 mm    % Body Fat 27 %    Grip Strength 26 kg    Flexibility 18.5 in    Single Leg Stand 30 seconds           Post Biometrics - 05/01/20 1030       Post  Biometrics   Height 5' 7"  (1.702 m)    Weight 54.1 kg    Waist Circumference 29 inches    Hip Circumference 35.75 inches    Waist to Hip Ratio 0.81 %    BMI (Calculated) 18.68    Triceps Skinfold 12 mm    % Body Fat 27.4 %    Grip Strength 25 kg    Flexibility 18.5 in    Single Leg Stand 30 seconds           Nutrition:  Nutrition Therapy & Goals - 04/02/20 0729      Nutrition Therapy   Diet General, healthful      Personal Nutrition Goals   Nutrition Goal Continue following a healthy diet           Nutrition Discharge:   Education Questionnaire Score:  Knowledge Questionnaire Score - 03/11/20 1027      Knowledge Questionnaire Score   Pre Score 23/24           Goals reviewed with patient; copy given to patient.Pt graduated from cardiac rehab program on 05/01/20 with completion of 12 exercise sessions in Phase II. Pt maintained good attendance and progressed nicely during his participation in rehab as evidenced by increased MET level.   Medication list reconciled. Repeat  PHQ score-0 .  Pt has made significant lifestyle changes and should be commended for her success. Pt feels she has achieved her goals during cardiac rehab. Aldean increased her  distance on her post exercise walk test by 317 feet.  Pt plans to continue exercise by walking with her husband and walking her dog. Sinaya hopes to go to the gym at some point. We are proud of Lynesha's progress. Barnet Pall, RN,BSN 05/30/2020 1:57 PM

## 2020-05-22 ENCOUNTER — Ambulatory Visit: Payer: Medicare Other | Admitting: Cardiology

## 2020-05-23 ENCOUNTER — Other Ambulatory Visit: Payer: Self-pay | Admitting: Obstetrics and Gynecology

## 2020-05-23 DIAGNOSIS — Z1231 Encounter for screening mammogram for malignant neoplasm of breast: Secondary | ICD-10-CM

## 2020-05-30 NOTE — Addendum Note (Signed)
Encounter addended by: Magda Kiel, RN on: 05/30/2020 2:02 PM  Actions taken: Clinical Note Signed

## 2020-06-06 NOTE — Progress Notes (Signed)
Primary Physician/Referring:  Fanny Bien, MD  Patient ID: Brittany Whitney, female    DOB: [birth year], 65 y.o.   MRN: [id number]  Chief Complaint  Patient presents with   AVR   Follow-up    3 month   HPI:    Brittany Whitney  is a 65 y.o. female who presents for a follow-up for Valvular heart disease.  She is a retired Marine scientist by profession, had bicuspid aortic valve and severe aortic regurgitation, normal coronary arteries by angiography on 10/27/16, familial hypertriglyceridemia, mild chronic dyspnea, psoriatic arthritis, Crohn's disease S/P colectomy and ileostomy placement, underwent aortic valve repair and reduction aortoplasty on 08/07/2017 at [facility].  Due to dyspnea on exertion and worsening aortic regurgitation, she underwent Aortic Valve Replacement (#23 CE Valve), Hemi arch replacement (Hemishield graft #30) at [facility] clinic on 12/19/2019. 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  Patient evaluated given clinic on 03/18/2020, felt to be doing well from cardiac standpoint.  She now presents to me for 12-monthfollow-up.    Except for exacerbation and rheumatoid arthritis, she has no specific complaints today. She has returned to full activity without limitation.  Past Medical History:  Diagnosis Date   Aortic valve disorder    Arthritis    [place]'s colitis ([place]    Endometrial polyp    History of exercise stress test 12-25-2016  by dr gEinar Gip  no evidence ischemia, exercise tolerence excellent, no significant arrhythmias   History of malignant melanoma of skin    2005  post excision back area-- localized   Hypothyroidism    Ileostomy in place ([facility]    for crohn's colitis -- external pouch   Migraines    Migraines    Severe aortic valve regurgitation cardiologist-  dr gEinar Gip-  dx by echo 2017   echo 06/ 10/2016 no change except noted mod. to sev. pulmonry htn/  verified by cardiac cath 07/ 2018 showed no  evidence pulmonary htn   Vitamin D deficiency    Past Surgical History:  Procedure Laterality Date   ABDOMINAL EXPLORATION SURGERY  x6  1988 (first one) --  1994 (last one)   proctocolectomy w/ ileostomy 1988;  revision 1989, 1991, 1992 internal pouch;  temporary external pouch 1993 then [place]   Procedure: AORTIC ARCH ANGIOGRAPHY;  Surgeon: GAdrian Prows MD;  [place];  Service: Cardiovascular;  Laterality: N/A;   BREAST BIOPSY Right 1998   benign   BUNIONECTOMY Right    CARPAL TUNNEL RELEASE Right 09/2016   DILATATION & CURETTAGE/HYSTEROSCOPY WITH MYOSURE N/A 01/21/2017   Procedure: DILATATION & CURETTAGE/HYSTEROSCOPY WITH MYOSURE;  Surgeon: MArvella Nigh MD;  [place]  Service: Gynecology;  Laterality: N/A;   LIPOMA EXCISION  x2   left shouler area   MELANOMA EXCISION  2005   back area   RIGHT/LEFT HEART CATH AND CORONARY ANGIOGRAPHY N/A 10/27/2016   Procedure: Right/Left Heart Cath and Coronary Angiography;  Surgeon: GAdrian Prows MD;  [place];  Service: Cardiovascular;  Laterality: N/A;  normal coronary arteries, normal LVSF and LVEDP, moderate to severe 3+ aortic regurg.,  no evidence pulmonry hypertension, normal cardiac output and index   RIGHT/LEFT HEART CATH AND CORONARY ANGIOGRAPHY N/A 12/12/2019   Procedure: RIGHT/LEFT HEART CATH AND CORONARY ANGIOGRAPHY;  Surgeon: GAdrian Prows MD;  [place];  Service: Cardiovascular;  Laterality: N/A;  THORACIC AORTOGRAM N/A 10/27/2016   Procedure: Thoracic Aortogram;  Surgeon: Adrian Prows, MD;  Location: [place] [facility];  Service: Cardiovascular;  Laterality: N/A;   TRANSTHORACIC ECHOCARDIOGRAM  10/15/2016   dr Einar Gip   ef 91%, grade 1 diastolic dysfunction/ moderate to severe aortic valve regurg., no evidence of stenosis/  mild to moderate TR/  PASP 41mHg , no evidence pulmonary hypertension    TUBAL LIGATION Bilateral yrs ago   Family History  Problem Relation Age of Onset   Breast cancer Mother 623  Breast cancer Maternal Aunt        after 537  Breast cancer Paternal Aunt        after 576   Social History   Tobacco Use   Smoking status: Never Smoker   Smokeless tobacco: Never Used  Substance Use Topics   Alcohol use: Yes    Alcohol/week: 7.0 standard drinks    Types: 7 Glasses of wine per week   Marital Status: Married  ROS  Review of Systems  Cardiovascular: Negative for chest pain, dyspnea on exertion, leg swelling, near-syncope, orthopnea, palpitations, paroxysmal nocturnal dyspnea and syncope.  Respiratory: Negative for sputum production.   Hematologic/Lymphatic: Negative for bleeding problem. Does not bruise/bleed easily.  Musculoskeletal: Positive for arthritis (Psoriatic arthritis involving bilateral hands).  Neurological: Negative for headaches.   Objective  Blood pressure 107/68, pulse 83, temperature 97.6 F (36.4 C), temperature source Temporal, resp. rate 16, height _0  (1.702 m), weight 121 lb 6.4 oz (55.1 kg), SpO2 99 %.  Vitals with BMI 06/07/2020 05/01/2020 03/05/2020  Height _1  _2  _3   Weight 121 lbs 6 oz 119 lbs 4 oz 119 lbs 11 oz  BMI 19.01 [id number] Systolic 1130- 1865 Diastolic 68 - 70  Pulse 83 - 90     Physical Exam Cardiovascular:     Rate and Rhythm: Normal rate and regular rhythm.     Pulses: Intact distal pulses.          Radial pulses are 1+ on the right side and 2+ on the left side.     Heart sounds: Murmur heard.  High-pitched blowing decrescendo early diastolic murmur is present with a grade of 1/4 at the upper right sternal border radiating to the apex. No gallop.      Comments: No leg edema, no JVD. Pulmonary:     Effort: Pulmonary effort is normal.     Breath sounds: Normal breath sounds. No wheezing, rhonchi or rales.  Abdominal:     General: Bowel sounds are normal.     Palpations: Abdomen is soft.     Laboratory examination:   Recent Labs    12/07/19 0831 12/12/19 1244 12/12/19 1248 12/12/19 1253 01/09/20 1047  NA 140   < > 142 138 137  K 4.2   < > 3.8 4.1 4.2  CL 104  --   --   --  99  CO2 22  --   --   --  20  GLUCOSE 86  --   --   --  98  BUN 27  --   --   --  28*  CREATININE 1.02*  --   --   --  1.06*  CALCIUM 9.5  --   --   --  11.2*  GFRNONAA 58*  --   --   --  55*  GFRAA 67  --   --   --  64   < > =  values in this interval not displayed.   CrCl cannot be calculated (Patient's most recent lab result is older than the maximum 21 days allowed.).  CMP Latest Ref Rng & Units 01/09/2020 12/12/2019 12/12/2019  Glucose 65 - 99 mg/dL 98 - -  BUN 8 - 27 mg/dL 28(H) - -  Creatinine 0.57 - 1.00 mg/dL 1.06(H) - -  Sodium 134 - 144 mmol/L 137 138 142  Potassium 3.5 - 5.2 mmol/L 4.2 4.1 3.8  Chloride 96 - 106 mmol/L 99 - -  CO2 20 - 29 mmol/L 20 - -  Calcium 8.7 - 10.3 mg/dL 11.2(H) - -  Total Protein 6.0 - 8.5 g/dL - - -  Total Bilirubin 0.0 - 1.2 mg/dL - - -  Alkaline Phos 48 - 121 IU/L - - -  AST 0 - 40 IU/L - - -  ALT 0 - 32 IU/L - - -   CBC Latest Ref Rng & Units 01/09/2020 12/12/2019 12/12/2019  WBC 3.4 - 10.8 x10E3/uL 9.3 - -  Hemoglobin 11.1 - 15.9 g/dL 14.9 10.9(L) 10.5(L)  Hematocrit 34.0 - 46.6 % 45.0 32.0(L) 31.0(L)  Platelets 150 - 450 x10E3/uL 472(H) - -    External labs:   Labs 06/06/2020:  Serum glucose 85 mg, BUN 20, creatinine 0.90, EGFR 67 mL, sodium 144, potassium 4.0.  Total cholesterol 200, triglycerides 231, HDL 58, LDL 103.  Vitamin D 34.7.   Cholesterol, total 218.000 M 03/21/2019 HDL 49.000 M 03/21/2019 LDL 117 Triglycerides 262.000 M 03/21/2019  Hemoglobin 12.000 G/ 04/27/2019 Platelets 302.000 X 04/27/2019  Creatinine, Serum 0.950 MG/ 04/27/2019 Potassium 4.000 05/25/2017 ALT (SGPT) 15.000 IU/ 04/27/2019  TSH 2.560 12/30/2018  09/29/2017: WBC 12.03, RBC 4.39, H/H 12.2/38.8, MCV 88.4, Platelets 372. Rest of CBC Normal.  Glucose 106, BUN 26,  Creat 0.95, Na/K 134/4.4.   08/09/2017: TSH 1.76 Normal.  Medications and allergies   Allergies  Allergen Reactions   Flagyl [Metronidazole] Other (See Comments)    Numbness in legs and arms   Penicillins Hives   Ceclor [Cefaclor] Rash   Current Outpatient Medications on File Prior to Visit  Medication Sig Dispense Refill   ALPRAZolam (XANAX) 0.25 MG tablet Take 0.25 mg by mouth at bedtime as needed for anxiety.      aspirin EC 81 MG tablet Take 81 mg by mouth daily. Swallow whole.      Biotin 5000 MCG TABS Take 1 tablet by mouth daily.      Bismuth Subgallate 200 MG CHEW Chew 1 tablet by mouth 2 (two) times daily.      cyclobenzaprine (FLEXERIL) 5 MG tablet Take 5 mg by mouth daily. One tablet at bedtime.      DHEA 10 MG CAPS Take 1 capsule by mouth every morning.      Diindolylmethane POWD Take 1 capsule by mouth 2 (two) times daily. 100 mg capsule      diltiazem (CARDIZEM CD) 120 MG 24 hr capsule Take 1 capsule (120 mg total) by mouth daily. 30 capsule 3   doxycycline (PERIOSTAT) 20 MG tablet Take 20 mg by mouth 2 (two) times daily.      ibuprofen (ADVIL,MOTRIN) 200 MG tablet Take 200 mg by mouth every 6 (six) hours as needed.      levothyroxine (SYNTHROID, LEVOTHROID) 100 MCG tablet Take 137 mcg by mouth daily before breakfast.      MAGNESIUM GLYCINATE PO Take 200 mg by mouth. One tablet at bedtime.      MAGNESIUM MALATE PO Take 141 mg  by mouth. One tablet every morning.      meloxicam (MOBIC) 15 MG tablet Take 1 tablet (15 mg total) by mouth daily. 60 tablet 0   Menatetrenone (VITAMIN K2) 100 MCG TABS Take 100 mcg by mouth daily.      naratriptan (AMERGE) 2.5 MG tablet Take 2.5 mg by mouth as needed. Take one (1) tablet at onset of headache; if returns or does not resolve, may repeat after 4 hours; do not exceed five (5) mg in 24 hours.      predniSONE (DELTASONE) 5 MG tablet Take 5 mg by mouth every other day. TAKES IN AM     progesterone (PROMETRIUM) 100 MG  capsule Take 125 mg by mouth at bedtime. COMPOUND MEDICATION IN PILL FORM     rosuvastatin (CRESTOR) 5 MG tablet Take 5 mg by mouth once a week. One tablet once a week      sulfaSALAzine (AZULFIDINE) 500 MG tablet Take 1,000 mg by mouth 2 (two) times daily.      SUMAtriptan (IMITREX) 50 MG tablet Take 50 mg by mouth every 2 (two) hours as needed.      traMADol (ULTRAM) 50 MG tablet Take 1 tablet (50 mg total) by mouth 2 (two) times daily. 60 tablet 1   Vitamin D, Ergocalciferol, (DRISDOL) 50000 UNITS CAPS Take by mouth every 14 (fourteen) days.      zolpidem (AMBIEN) 10 MG tablet Take 10 mg by mouth at bedtime. for sleep  3   No current facility-administered medications on file prior to visit.    Radiology:   Chest X-Ray 01/01/2020:  1. Postoperative changes with resolving areas of subsegmental atelectasis and trace bilateral pleural effusions, as above. Previously noted small left-sided pneumothorax has resolved. On the lateral view there is a probable small volume of postoperative fluid posterior to the inferior aspect of the sternum. Attention to this area on follow-up studies is recommended to ensure resolution.  CT Chest/Abdomen/Pelvis 12/19/2019: [facility]  1. The patient has undergone interval AVR using a bioprosthesis, as well  as supracoronary ascending aortic grafting. There are no findings to  suggest anastomotic stenosis, infection, or leak. There are the typical  postoperative changes present. Overall, the findings are appropriate for  the recent postoperative state. Beyond that, normal thoracic aorta. No  acute aortic pathology identified.  2. Normal abdominal aorta. No acute abdominal aortic pathology.  3. Stable appearing changes consistent with left-sided ileostomy.  4. Gallstones noted within the gallbladder.   CTA Chest 03/18/2020: 1. Status post AVR using a bioprosthesis, as well as supracoronary  ascending aortic grafting. There are no findings to  suggest anastomotic  stenosis, infection, or leak. Interval resolution of postoperative  changes.  2. 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   Cardiac Studies:   Right and left heart catheterization 10/27/2016: Normal coronary arteries. Normal LV systolic function. Normal LVEDP. Moderate to severe 3+ aortic regurgitation. Mild aortic root dilatation. No evidence of thoracic aortic aneurysm. No evidence of pulmonary hypertension. Normal card output and index. Recommendation: At this point, unless patient continues to symptoms of dyspnea, we'll continue medical therapy. No evidence of pulmonary hypertension. She probably will eventually need aortic valve replacement. Left ventricle is not enlarged or dilated and the EDP is normal hence could continue medical therapy for now. 90 mL contrast utilized.  Right and left heart catheterization and aortic root angiogram 12/12/19: Normal coronary arteriogram, codominant system.  Normal LV systolic function, EF 38%.  Severe aortic  regurgitation.  Diffuse aortic root and ascending aorta and arch dilatation suggestive of arteriomegaly.  No focal aneurysmal dilatation.  Echocardiogram 03/18/2020:   - The left ventricle is normal in size. Left ventricular systolic function is  normal. EF = 58  5% (2D biplane) Normal left ventricular diastolic function.  - The right ventricle is normal in size. Right ventricular systolic function is  low normal.  - The left atrial cavity is mildly dilated.  - The visualized aorta is dilated with a maximal dimension of 4.0 cm.  - Carpentier-Edwards prosthetic aortic valve (size #23). There is noaortic valve  regurgitation. The peak gradient is 19 mmHg, the mean gradient is 10 mmHg and the  dimensionless valve index is 0.34.  - Exam was compared with the prior CC echocardiographic exam performed on  12/19/2019 [facility], similar to post-op findings.    EKG   EKG 01/08/2020: Sinus tachycardia at rate of  117 bpm, left atrial enlargement, left axis deviation, left anterior fascicular block.  Right bundle branch block.  Poor R wave progression, cannot exclude anteroseptal infarct old.  Borderline treated for LVH.  Compared to 08/18/2019, sinus tachycardia.   Assessment     ICD-10-CM   1. Aortic valve disorder  I35.9   2. S/P AVR (aortic valve replacement) and aortoplasty  Z95.2   3. S/P ascending aortic aneurysm repair  H29.924 icosapent Ethyl (VASCEPA) 1 g capsule   Z86.79   4. Dyspnea on exertion  R06.00   5. SBE (subacute bacterial endocarditis) prophylaxis candidate  Z29.8   6. Mixed hyperlipidemia  E78.2 icosapent Ethyl (VASCEPA) 1 g capsule     Meds ordered this encounter  Medications   icosapent Ethyl (VASCEPA) 1 g capsule    Sig: Take 2 capsules (2 g total) by mouth 2 (two) times daily.    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 Bill Salinas.  Due to dyspnea on exertion and worsening aortic regurgitation, she underwent Aortic Valve Replacement (#23 CE Valve), Hemi arch replacement (Hemishield graft #30) at [facility] clinic on 12/19/2019. 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   She has had a recent follow-up with [facility] clinic on 03/18/2020 and at that time  cardiac status felt to be stable.  This is a 80-monthoffice visit. States that she is back to her baseline and has started doing activities without any limitations.  She has had exacerbation in rheumatoid arthritis and wants to start with Remicade. I do not see any contraindication from cardiac standpoint.  I reviewed her labs, triglycerides continue to remain elevated, in view of cardiovascular disease, I have again attempted to Rx Vascepa. Hopefully the insurance company will approve this. Otherwise stable from cardiac standpoint and I will see her back in a year or sooner if problems. I did not order an echocardiogram as she  plans to go back to [facility] clinic for follow-up soon.  I evaluated her external medical records from [facility] clinic, reviewed reports of CT angiogram chest and also echocardiogram.   40-minute office visit encounter with evaluation of complex medical issues, review of external records and ordering of labs and coordination of care.   Adrian Prows, MD, [facility] 06/07/2020, 10:51 AM Office: [phone] Pager: [phone]

## 2020-06-07 ENCOUNTER — Other Ambulatory Visit: Payer: Self-pay

## 2020-06-07 ENCOUNTER — Encounter: Payer: Self-pay | Admitting: Cardiology

## 2020-06-07 ENCOUNTER — Ambulatory Visit: Payer: Medicare Other | Admitting: Cardiology

## 2020-06-07 VITALS — BP 107/68 | HR 83 | Temp 97.6°F | Resp 16 | Ht 67.0 in | Wt 121.4 lb

## 2020-06-07 DIAGNOSIS — I359 Nonrheumatic aortic valve disorder, unspecified: Secondary | ICD-10-CM

## 2020-06-07 DIAGNOSIS — Z952 Presence of prosthetic heart valve: Secondary | ICD-10-CM

## 2020-06-07 DIAGNOSIS — R0600 Dyspnea, unspecified: Secondary | ICD-10-CM

## 2020-06-07 DIAGNOSIS — Z9889 Other specified postprocedural states: Secondary | ICD-10-CM

## 2020-06-07 DIAGNOSIS — Z8679 Personal history of other diseases of the circulatory system: Secondary | ICD-10-CM

## 2020-06-07 DIAGNOSIS — E782 Mixed hyperlipidemia: Secondary | ICD-10-CM

## 2020-06-07 DIAGNOSIS — Z298 Encounter for other specified prophylactic measures: Secondary | ICD-10-CM

## 2020-06-07 DIAGNOSIS — R0609 Other forms of dyspnea: Secondary | ICD-10-CM

## 2020-06-07 MED ORDER — ICOSAPENT ETHYL 1 G PO CAPS: 2.0000 g | ORAL_CAPSULE | Freq: Two times a day (BID) | ORAL | 3 refills | Status: DC

## 2020-06-10 ENCOUNTER — Other Ambulatory Visit: Payer: Self-pay | Admitting: Sports Medicine

## 2020-06-11 ENCOUNTER — Other Ambulatory Visit: Payer: Self-pay | Admitting: Sports Medicine

## 2020-06-11 MED ORDER — TRAMADOL HCL 50 MG PO TABS: 50.0000 mg | ORAL_TABLET | Freq: Two times a day (BID) | ORAL | 1 refills | Status: DC

## 2020-06-12 ENCOUNTER — Other Ambulatory Visit: Payer: Self-pay

## 2020-06-12 DIAGNOSIS — Z952 Presence of prosthetic heart valve: Secondary | ICD-10-CM

## 2020-06-12 DIAGNOSIS — R059 Cough, unspecified: Secondary | ICD-10-CM

## 2020-06-12 DIAGNOSIS — I359 Nonrheumatic aortic valve disorder, unspecified: Secondary | ICD-10-CM

## 2020-06-12 MED ORDER — DILTIAZEM HCL ER COATED BEADS 120 MG PO CP24: 120.0000 mg | ORAL_CAPSULE | Freq: Every day | ORAL | 3 refills | Status: DC

## 2020-07-08 ENCOUNTER — Ambulatory Visit (INDEPENDENT_AMBULATORY_CARE_PROVIDER_SITE_OTHER): Payer: Medicare Other | Admitting: Podiatry

## 2020-07-08 ENCOUNTER — Other Ambulatory Visit: Payer: Self-pay

## 2020-07-08 ENCOUNTER — Ambulatory Visit (INDEPENDENT_AMBULATORY_CARE_PROVIDER_SITE_OTHER): Payer: Medicare Other

## 2020-07-08 DIAGNOSIS — S9030XA Contusion of unspecified foot, initial encounter: Secondary | ICD-10-CM

## 2020-07-08 DIAGNOSIS — R600 Localized edema: Secondary | ICD-10-CM | POA: Diagnosis not present

## 2020-07-08 NOTE — Progress Notes (Signed)
   HPI: 65 y.o. female presenting today for evaluation of right forefoot pain with bruising and swelling.  Patient states that she went on a ski trip to [place] where she sustained an injury in a ski bag fell on top of her foot.  She immediately had pain and noticed bruising with swelling to the area.  In the past week it has improved.  DOI: 06/29/2020.  She has not done anything for treatment.  She does take meloxicam daily.  Past Medical History:  Diagnosis Date  . Aortic valve disorder   . Arthritis   . Crohn's colitis ([place])   . Endometrial polyp   . History of exercise stress test 12-25-2016  by dr Einar Gip   no evidence ischemia, exercise tolerence excellent, no significant arrhythmias  . History of malignant melanoma of skin    2005  post excision back area-- localized  . Hypothyroidism   . Ileostomy in place [facility])    for crohn's colitis -- external pouch  . Migraines   . Migraines   . Severe aortic valve regurgitation cardiologist-  dr Einar Gip--  dx by echo 2017   echo 06/ 10/2016 no change except noted mod. to sev. pulmonry htn/  verified by cardiac cath 07/ 2018 showed no evidence pulmonary htn  . Vitamin D deficiency      Physical Exam: General: The patient is alert and oriented x3 in no acute distress.  Dermatology: Skin is warm, dry and supple bilateral lower extremities. Negative for open lesions or macerations.  Ecchymosis with edema noted to the right forefoot  Vascular: Palpable pedal pulses bilaterally.  Ecchymosis and edema right forefoot capillary refill within normal limits.  Neurological: Epicritic and protective threshold grossly intact bilaterally.   Musculoskeletal Exam: Range of motion within normal limits to all pedal and ankle joints bilateral. Muscle strength 5/5 in all groups bilateral.  There is tenderness to palpation diffusely throughout the entire forefoot  Radiographic Exam:  Normal osseous mineralization. Joint spaces preserved. No  fracture/dislocation/boney destruction.    Assessment: 1.  Contusion right forefoot with ecchymosis and edema   Plan of Care:  1. Patient evaluated. X-Rays reviewed.  2.  Continue meloxicam daily as per prescribing physician 3.  Postsurgical shoe dispensed.  Wear daily as needed 4.  Recommend RICE.  The bruising and swelling should resolve over the next 3-4 weeks 5.  Return to clinic as needed      Edrick Kins, DPM [facility]  Dr. Edrick Kins, DPM    [address], [place] [address]                Office [phone]  Fax [phone]

## 2020-08-19 ENCOUNTER — Ambulatory Visit: Payer: Medicare Other | Admitting: Cardiology

## 2020-11-20 ENCOUNTER — Inpatient Hospital Stay: Admission: RE | Admit: 2020-11-20 | Payer: Medicare Other | Source: Ambulatory Visit

## 2020-11-21 ENCOUNTER — Other Ambulatory Visit: Payer: Self-pay | Admitting: Sports Medicine

## 2021-01-14 ENCOUNTER — Other Ambulatory Visit: Payer: Self-pay

## 2021-01-14 ENCOUNTER — Ambulatory Visit
Admission: RE | Admit: 2021-01-14 | Discharge: 2021-01-14 | Disposition: A | Discharge status: Home / Self Care | Payer: Medicare Other | Source: Ambulatory Visit | Attending: Obstetrics and Gynecology | Admitting: Obstetrics and Gynecology

## 2021-01-14 ENCOUNTER — Ambulatory Visit: Payer: Medicare Other

## 2021-01-14 DIAGNOSIS — Z1231 Encounter for screening mammogram for malignant neoplasm of breast: Secondary | ICD-10-CM

## 2021-06-25 ENCOUNTER — Other Ambulatory Visit: Payer: Self-pay

## 2021-06-25 ENCOUNTER — Ambulatory Visit: Payer: Medicare Other | Admitting: Cardiology

## 2021-06-25 ENCOUNTER — Encounter: Payer: Self-pay | Admitting: Cardiology

## 2021-06-25 VITALS — BP 136/76 | HR 85 | Temp 98.1°F | Resp 16 | Ht 67.0 in | Wt 129.2 lb

## 2021-06-25 DIAGNOSIS — Z9889 Other specified postprocedural states: Secondary | ICD-10-CM

## 2021-06-25 DIAGNOSIS — Z8679 Personal history of other diseases of the circulatory system: Secondary | ICD-10-CM

## 2021-06-25 DIAGNOSIS — Z298 Encounter for other specified prophylactic measures: Secondary | ICD-10-CM

## 2021-06-25 DIAGNOSIS — Z952 Presence of prosthetic heart valve: Secondary | ICD-10-CM

## 2021-06-25 NOTE — Progress Notes (Signed)
Primary Physician/Referring:  Fanny Bien, MD  Patient ID: Brittany Whitney, female    DOB: Oct 09, 1955, 66 y.o.   MRN: [id number]  Chief Complaint  Patient presents with   Aortic Valve Disease   Follow-up    1 year   HPI:    Brittany Whitney  is a 66 y.o. female who presents for a follow-up for Valvular heart disease.  She is a retired Marine scientist by profession, had bicuspid aortic valve and severe aortic regurgitation, normal coronary arteries by angiography on 10/27/16, familial hypertriglyceridemia, mild chronic dyspnea, psoriatic arthritis, Crohn's disease S/P colectomy and ileostomy placement, underwent aortic valve repair and reduction aortoplasty on 08/07/2017 at [facility].  Due to worsening aortic regurgitation, she underwent Aortic Valve Replacement (#23 CE Valve), Hemi arch replacement (Hemishield graft #30) at [facility] clinic on 12/19/2019. 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  She presents here for annual visit, presently doing well and remains asymptomatic.  She was evaluated at [facility] clinic in December 2022.  Past Medical History:  Diagnosis Date   Aortic valve disorder    Arthritis    Crohn's colitis ([place])    Endometrial polyp    History of malignant melanoma of skin    2005  post excision back area-- localized   Hypothyroidism    Ileostomy in place [facility])    for crohn's colitis -- external pouch   Migraines    Migraines    Vitamin D deficiency    Social History   Tobacco Use   Smoking status: Never   Smokeless tobacco: Never  Substance Use Topics   Alcohol use: Yes    Alcohol/week: 7.0 standard drinks    Types: 7 Glasses of wine per week    Comment: occasionally   Marital Status: Married  ROS  Review of Systems  Cardiovascular:  Negative for chest pain, dyspnea on exertion and leg swelling.  Objective  Blood pressure 136/76, pulse 85, temperature 98.1 F (36.7 C), temperature source Temporal,  resp. rate 16, height 5' 7"  (1.702 m), weight 129 lb 3.2 oz (58.6 kg), SpO2 94 %.  Vitals with BMI 06/25/2021 06/07/2020 05/01/2020  Height 5' 7"  5' 7"  5' 7"   Weight 129 lbs 3 oz 121 lbs 6 oz 119 lbs 4 oz  BMI 20.23 81.27 51.70  Systolic 017 494 -  Diastolic 76 68 -  Pulse 85 83 -     Physical Exam Cardiovascular:     Rate and Rhythm: Normal rate and regular rhythm.     Pulses: Intact distal pulses.          Radial pulses are 1+ on the right side and 2+ on the left side.     Heart sounds: Murmur heard.  High-pitched blowing decrescendo early diastolic murmur is present with a grade of 1/4 at the upper right sternal border radiating to the apex.    No gallop.     Comments: No leg edema, no JVD. Pulmonary:     Effort: Pulmonary effort is normal.     Breath sounds: Normal breath sounds. No wheezing, rhonchi or rales.  Abdominal:     General: Bowel sounds are normal.     Palpations: Abdomen is soft.   Laboratory examination:   External labs:   Cholesterol, total 199.000 M 02/14/2021 HDL 60.000 MG 02/14/2021 LDL 97.000 MG 02/14/2021 Triglycerides 253.000 M 02/14/2021  Hemoglobin 13.700 G/ 05/20/2021 Platelets 245.000 X1 05/20/2021  Creatinine, Serum 1.040 MG/ 05/20/2021  Potassium 4.200 mm 01/09/2020 ALT (SGPT) 41.000 IU/ 05/20/2021  TSH 1.150 01/06/2021  Medications and allergies   Allergies  Allergen Reactions   Flagyl [Metronidazole] Other (See Comments)    Numbness in legs and arms   Penicillins Hives   Ceclor [Cefaclor] Rash    Current Outpatient Medications:    ALPRAZolam (XANAX) 0.25 MG tablet, Take 0.25 mg by mouth at bedtime as needed for anxiety. , Disp: , Rfl:    aspirin EC 81 MG tablet, Take 81 mg by mouth daily. Swallow whole. , Disp: , Rfl:    Biotin 5000 MCG TABS, Take 1 tablet by mouth daily. , Disp: , Rfl:    Bismuth Subgallate (DEVROM) 200 MG CAPS, 1 tablet as needed, Disp: , Rfl:    Bismuth Subgallate 200 MG CHEW, Chew 1 tablet by mouth 2 (two) times  daily. , Disp: , Rfl:    cyclobenzaprine (FLEXERIL) 5 MG tablet, Take 5 mg by mouth daily. One tablet at bedtime. , Disp: , Rfl:    DHEA 10 MG CAPS, Take 1 capsule by mouth every morning. , Disp: , Rfl:    Diindolylmethane POWD, Take 1 capsule by mouth 2 (two) times daily. 100 mg capsule , Disp: , Rfl:    doxycycline (PERIOSTAT) 20 MG tablet, Take 20 mg by mouth 2 (two) times daily. , Disp: , Rfl:    hydrOXYzine (ATARAX) 50 MG tablet, Take 1 tablet by mouth daily., Disp: , Rfl:    levothyroxine (SYNTHROID) 137 MCG tablet, Take 1 tablet by mouth daily with breakfast., Disp: , Rfl:    MAGNESIUM GLYCINATE PO, Take 200 mg by mouth. One tablet at bedtime. , Disp: , Rfl:    MAGNESIUM MALATE PO, Take 141 mg by mouth. One tablet every morning. , Disp: , Rfl:    Menatetrenone (VITAMIN K2) 100 MCG TABS, Take 100 mcg by mouth daily. , Disp: , Rfl:    naratriptan (AMERGE) 2.5 MG tablet, Take 2.5 mg by mouth as needed. Take one (1) tablet at onset of headache; if returns or does not resolve, may repeat after 4 hours; do not exceed five (5) mg in 24 hours. , Disp: , Rfl:    Omega-3 Fatty Acids (FISH OIL OMEGA-3) 1000 MG CAPS, Take 1 capsule by mouth daily., Disp: , Rfl:    predniSONE 2 MG TBEC, Take 2 mg by mouth every other day. TAKES IN AM, Disp: , Rfl:    progesterone (PROMETRIUM) 100 MG capsule, Take 125 mg by mouth at bedtime. COMPOUND MEDICATION IN PILL FORM, Disp: , Rfl:    rosuvastatin (CRESTOR) 5 MG tablet, Take 5 mg by mouth once a week. One tablet once a week , Disp: , Rfl:    sulfaSALAzine (AZULFIDINE) 500 MG tablet, Take 1,000 mg by mouth 2 (two) times daily. , Disp: , Rfl:    SUMAtriptan (IMITREX) 50 MG tablet, Take 50 mg by mouth every 2 (two) hours as needed. , Disp: , Rfl:    Vitamin D, Ergocalciferol, (DRISDOL) 50000 UNITS CAPS, Take by mouth every 14 (fourteen) days. , Disp: , Rfl:    diltiazem (CARDIZEM CD) 120 MG 24 hr capsule, Take 1 capsule (120 mg total) by mouth daily., Disp: 30 capsule,  Rfl: 3    Radiology:   Chest X-Ray 01/01/2020:  1. Postoperative changes with resolving areas of subsegmental atelectasis and trace bilateral pleural effusions, as above. Previously noted small left-sided pneumothorax has resolved. On the lateral view there is a probable small volume of postoperative fluid  posterior to the inferior aspect of the sternum. Attention to this area on follow-up studies is recommended to ensure resolution.  CT Chest/Abdomen/Pelvis 12/19/2019: [facility]  1. The patient has undergone interval AVR using a bioprosthesis, as well  as supracoronary ascending aortic grafting. There are no findings to  suggest anastomotic stenosis, infection, or leak. There are the typical  postoperative changes present. Overall, the findings are appropriate for  the recent postoperative state. Beyond that, normal thoracic aorta. No  acute aortic pathology identified.  2. Normal abdominal aorta. No acute abdominal aortic pathology.  3. Stable appearing changes consistent with left-sided ileostomy.  4. Gallstones noted within the gallbladder.   CTA Chest 03/18/2020: 1. Status post AVR using a bioprosthesis, as well as supracoronary  ascending aortic grafting. There are no findings to suggest anastomotic  stenosis, infection, or leak.  Interval resolution of postoperative  changes.  2. 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   Cardiac Studies:   Right and left heart catheterization 10/27/2016: Normal coronary arteries. Normal LV systolic function. Normal LVEDP. Moderate to severe 3+ aortic regurgitation. Mild aortic root dilatation. No evidence of thoracic aortic aneurysm. No evidence of pulmonary hypertension. Normal card output and index. Recommendation: At this point, unless patient continues to symptoms of dyspnea, we'll continue medical therapy. No evidence of pulmonary hypertension. She probably will eventually need aortic valve replacement. Left  ventricle is not enlarged or dilated and the EDP is normal hence could continue medical therapy for now. 90 mL contrast utilized.  Right and left heart catheterization and aortic root angiogram 12/12/19: Normal coronary arteriogram, codominant system.  Normal LV systolic function, EF 54%.  Severe aortic regurgitation.  Diffuse aortic root and ascending aorta and arch dilatation suggestive of arteriomegaly.  No focal aneurysmal dilatation.  Echocardiogram [facility]) 04/16/2021: - Exam indication: Hx of AVR, Hemi-arch replacement  - The left ventricle is normal in size. Left ventricular systolic function is normal. EF = 61  5% (2D biplane)  - The right ventricle is normal in size. Right ventricular systolic function is low normal.  - Carpentier-Edwards prosthetic aortic valve (size #23). There is no aortic valve regurgitation. The peak gradient is 24 mmHg, the mean gradient is 12 mmHg and the dimensionless valve index is 0.29. Prior peak/mean of 19/10 mmHg.  - Estimated right ventricular systolic pressure is likely underestimated due to a weak or incomplete tricuspid regurgitation signal and is, at least, 23 mmHg consistent with normal pulmonary artery pressures. Estimated right atrial pressure is 8 mmHg based on IVC assessment.  - Exam was compared with the prior CC echocardiographic exam performed on 03/18/2020. Similar findings.   EKG   EKG 06/25/2021: Normal sinus rhythm at rate of 76 bpm, left axis deviation, left atrial fascicular block.  Incomplete right bundle branch block.  Nonspecific T abnormality.  Compared to 01/08/2020, sinus tachycardia not present.  Assessment     ICD-10-CM   1. S/P AVR (aortic valve replacement) and aortoplasty  Z95.2 EKG 12-Lead    2. S/P ascending aortic aneurysm repair  Z98.890    Z86.79     3. SBE (subacute bacterial endocarditis) prophylaxis candidate  Z29.8       No orders of the defined types were placed in this encounter.   Medications  Discontinued During This Encounter  Medication Reason   traMADol (ULTRAM) 50 MG tablet    zolpidem (AMBIEN) 10 MG tablet    meloxicam (MOBIC) 15 MG tablet    icosapent  Ethyl (VASCEPA) 1 g capsule    levothyroxine (SYNTHROID, LEVOTHROID) 100 MCG tablet Dose change   Recommendations:   Brittany Whitney  is a 66 y.o. 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   I reviewed the results of the echocardiogram that was performed at [facility] clinic in December 2022.  She is very stable, they will be seeing her back in 1.5 years.  My physical examination is unchanged.  External labs reviewed.  Triglycerides are mildly elevated.  Otherwise she has no other significant cardiovascular factors.  No clinical evidence of heart failure.  We discussed endocarditis prophylaxis.  She is mildly allergic to ampicillin's with hives, but this was when she was in her childhood age, she could certainly try this again versus being on doxycycline prophylaxis versus azithromycin.  This options was given to the patient for dental prophylaxis.  Otherwise stable from cardiac standpoint, I will see him back in a year or sooner if problems.   Adrian Prows, MD, [facility] 06/25/2021, 10:33 AM Office: [phone] Pager: [phone]

## 2021-11-19 ENCOUNTER — Other Ambulatory Visit: Payer: Self-pay | Admitting: Obstetrics and Gynecology

## 2021-11-19 DIAGNOSIS — R102 Pelvic and perineal pain: Secondary | ICD-10-CM

## 2021-11-30 ENCOUNTER — Ambulatory Visit
Admission: RE | Admit: 2021-11-30 | Discharge: 2021-11-30 | Disposition: A | Discharge status: Home / Self Care | Payer: Medicare Other | Source: Ambulatory Visit | Attending: Obstetrics and Gynecology | Admitting: Obstetrics and Gynecology

## 2021-11-30 DIAGNOSIS — R102 Pelvic and perineal pain: Secondary | ICD-10-CM

## 2021-11-30 MED ORDER — GADOBENATE DIMEGLUMINE 529 MG/ML IV SOLN
11.0000 mL | Freq: Once | INTRAVENOUS | Status: AC | PRN
  Administered 2021-11-30: 11 mL via INTRAVENOUS

## 2021-12-01 ENCOUNTER — Other Ambulatory Visit: Payer: Self-pay | Admitting: Obstetrics and Gynecology

## 2021-12-01 DIAGNOSIS — R102 Pelvic and perineal pain: Secondary | ICD-10-CM

## 2021-12-08 ENCOUNTER — Ambulatory Visit
Admission: RE | Admit: 2021-12-08 | Discharge: 2021-12-08 | Disposition: A | Discharge status: Home / Self Care | Payer: Medicare Other | Source: Ambulatory Visit | Attending: Obstetrics and Gynecology | Admitting: Obstetrics and Gynecology

## 2021-12-08 DIAGNOSIS — R102 Pelvic and perineal pain: Secondary | ICD-10-CM

## 2021-12-12 ENCOUNTER — Telehealth: Payer: Self-pay

## 2021-12-12 ENCOUNTER — Encounter: Payer: Self-pay | Admitting: Cardiology

## 2021-12-12 NOTE — Telephone Encounter (Signed)
Spoke with Brittany Whitney regarding her referral to GYN oncology. She has an appointment scheduled with Dr. Berline Lopes on 01/08/22 at 9:00am. Patient agrees to date and time. She has been provided with office address and location. She is also aware of our mask and visitor policy. Patient verbalized understanding and will call with any questions.

## 2022-01-06 ENCOUNTER — Encounter: Payer: Self-pay | Admitting: Gynecologic Oncology

## 2022-01-07 NOTE — Progress Notes (Signed)
GYNECOLOGIC ONCOLOGY NEW PATIENT CONSULTATION   Patient Name: Brittany Whitney  Patient Age: 66 y.o. Date of Service: 01/08/22 Referring Provider: Dr. Arvella Nigh  Primary Care Provider: Fanny Bien, MD Consulting Provider: Jeral Pinch, MD   Assessment/Plan:  Postmenopausal patient with complex past surgical history and bilateral adnexal masses.  Discussed in detail with the patient findings on recent MRI.  The patient has had 2 ultrasounds with her OB/GYN this year which showed stable size of the right adnexal mass.  Although there is some concern regarding possible nodularity of the wall of the right adnexal cyst, we discussed that this finding may very well be related to her extensive surgical history and adhesions/peritoneal thickening.  On my review, I suspect that the left adnexal mass again represents a hydrosalpinx.  The right adnexal mass may also represent hydrosalpinx or a peritoneal inclusion cyst.  In terms of management options, we discussed close surveillance with repeat imaging, attempt at unilateral versus bilateral percutaneous cyst drainage, and surgery.  The patient understands the significant risk with regard to surgical intervention.  She has had multiple intra-abdominal surgeries, some of which were complicated by postoperative infection (enterocutaneous fistula, pelvic abscess requiring drainage).  She likely has extensive intra-abdominal adhesions.  With regard to attempted percutaneous drainage, I spoke with one of our interventional radiologist.  Given the small size of the right adnexal lesion and its close proximity to major vasculature, the risks associated with attempted drainage likely outweigh the benefit.  It would be possible to drain the left adnexal cyst, which the patient has not had done since 2013.  Fluid from this could be sent for cytology.  We could also defer any attempted drainage at this time and repeat an MRI in 3 to 6 months to assess for any  change in size or character of the bilateral adnexal masses.  Because the patient has developed symptoms as of February of this year, I think that consideration of cyst drainage is worthwhile.  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  The patient and I discussed that we could pursue drainage of the left adnexal cyst and if this does not lead to improvement of her pelvic pressure symptoms, I can reach out to neuroradiology about possibility of drainage of the Tarlov cyst.   The patient has been followed with yearly CA-125s.  She is unsure when her last was.  We discussed the utility of this test and its limitations.  It is not a diagnostic test; even if normal, we discussed that a large percentage of early stage ovarian cancers can have a normal CA-125.  Are also many noncancerous disease processes that can cause its elevation.  The patient would like to proceed with getting a CA-125 today.  Ultimately, after our long discussion today, the patient would like to proceed with attempt at percutaneous drainage of the left adnexal cyst.  We will request that this be sent for cytology.  If no improvement in her symptoms, we will plan to pursue possibility of draining her Tarlov cyst.  Also encouraged her to keep her appointment with urogynecology.  Because we will not be attempting drainage of the right adnexal cyst, I have placed an order for repeat MRI in 6 months to evaluate bilateral adnexa for any change.  A copy of this note was sent to the patient's referring provider.   75 minutes of total time  was spent for this patient encounter, including preparation, face-to-face counseling with the patient and coordination of care, and documentation of the encounter.   Jeral Pinch, MD  Division of Gynecologic Oncology  Department of Obstetrics and Gynecology  [facility]  ___________________________________________  Chief Complaint: Chief Complaint  Patient presents with   Hydrosalpinx    History of Present Illness:  Brittany Whitney is a 66 y.o. y.o. female who is seen in consultation at the request of Dr. Radene Knee for an evaluation of bilateral adnexal masses.  Patient's history is notable for Crohn's disease.  In the 1990s, she underwent total protocolectomy with ileostomy.  She has had multiple complications and additional surgeries related to this history including enterocutaneous fistula, multiple episodes of small bowel obstruction, ileostomy revision, postoperative development of an intra-abdominal abscess requiring drainage, and peristomal pyoderma.  She was previously noted to have a left adnexal mass that was tubular, concerning for hydrosalpinx. She underwent multiple IR drainage procedures for this presumed left hydrosalpinx, last 11/2011 (also in 2012 and 2011). Cytology from FNA of the left cyst in 2011 revealed clusters of columnar cells. No definitive evidence of atypia or malignancy.  In February of this year, she developed what she describes as a piriformis flare.  She has tissue in this area related to prior drain placement after colon surgery.  She describes feeling pelvic pressure and as if "something was going to fall out".  At that time, she pursued treatment including dry needling and her symptoms resolved.  In July, she developed similar symptoms again although symptoms were somewhat worse than they had been in February.  The symptoms are different than the symptoms she had related to pelvic pressure 10 years ago when her left adnexal cyst was discovered.  No she feels like she has a bulge or hernia at her perineum, she is not able to appreciate this.  The pressure sensation is in the area of where her anus and rectum would be.  She is currently going to pelvic floor physical therapy.  She also has pelvic organ prolapse and is scheduled  to see Dr. [place] Pink at the beginning of November.  She has used multiple pessaries in the past which made her symptoms worse.  She denies any urinary incontinence.  She does sometimes have to rock to empty her bladder.  At the time of her symptoms starting in February, [facility] showed a right adnexal tubular structure measuring 4.7 x 1.4 cm.  Left ovary measures 1.2 cm.  Left adnexal tubular cystic structure measures 4.7 x 2.3 cm.  [facility]elvic [facility] on 11/17/2021 shows a uterus measuring 6.7 x 4.3 x 2.6 cm with an endometrial lining of 4.3 mm.  Right ovary not visualized.  4.4 x 0.7 cm elongated cystic structure seen in the right adnexa without blood flow.  Left ovary measures 1.4 x 0.7 x 0.5 cm with simple cysts.  A 6.7 x 2.1 x 2.4 cm elongated cystic tubular structure without blood flow is seen in the left adnexa.  No free fluid.  MRI of the abdomen and A/P on 8/13: 1. Post prior proctocolectomy with LEFT lower quadrant ileostomy. No signs of bowel obstruction or abscess. 2. No overt signs of inflammation about the pelvis in the area of the proctectomy. 3. 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 Reportedly the patient has a history of bilateral hydrosalpinges which were evaluated recently  by ultrasound. The area on the RIGHT which may represent an endometrioma displays a small T2 dark peripheral nodule (image 16/5) 10 x 4 mm. This is indeterminate at this time in the absence of diffusion and subtraction imaging. For this reason the patient is being recalled for repeat assessment with instructions outlined in the [employer] dash board.  4. 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 ADDENDUM: The area in the RIGHT adnexal region is most suggestive of hematosalpinx with small nodular  enhancing focus. The possibility of fallopian tube neoplasm is considered. Findings associated with endometriosis could also potentially have a similar appearance but should be correlated with patient history and symptoms. Sequela of endometriosis would be unusual in a patient of this age in the absence of endometriosis history. Ultimately, Gyn Onc referral is suggested for further management. Would also correlate with any prior history of aspiration in this location or repeated instrumentation. It appears that the contralateral hydrosalpinx has been aspirated on at least 2 occasions.  Patient has a family history notable for multiple members with breast cancer as well as a brother with prostate cancer.  Her maternal grandmother had some sort of cancer although the family is unsure whether this was colon, ovarian, or a different cancer type.  Patient notes having genetic testing within the last 5 years with her OB/GYN which was negative for hereditary cancer syndrome.  While she does not have any established diagnosis, the patient wonders if she may have a hypermobility disorder.    PAST MEDICAL HISTORY:  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   Crohn's colitis ([place])    Endometrial polyp    History of malignant melanoma of skin    2005  post excision back area-- localized, in situ   Hypothyroidism    Ileostomy in place [facility])    for crohn's colitis -- external pouch   Migraines    Vitamin D deficiency      PAST SURGICAL HISTORY:  Past Surgical History:  Procedure Laterality Date   ABDOMINAL EXPLORATION SURGERY  x6  1988 (first one) --  1994 (last one)   proctocolectomy w/ ileostomy 1988;  revision [place] internal pouch;  temporary external pouch 1993 then [place] N/A 12/12/2019   Procedure: [place];  Surgeon: Adrian Prows, MD;  Location: [place] [facility];  Service:  Cardiovascular;  Laterality: N/A;   AORTIC VALVE REPAIR     Aortic valve repair and reduction aortoplasty on 08/07/2017 at [place] W/ TISSUE AORTIC VALVE REPLACEMENT     Aortic Valve Replacement (#23 CE Valve), Hemi arch replacement (Hemishield graft #[id number]) at [facility] clinic on 12/19/2019   BREAST BIOPSY Right 1998   benign   BUNIONECTOMY Right    CARPAL TUNNEL RELEASE Right 09/2016   DILATATION & CURETTAGE/HYSTEROSCOPY WITH MYOSURE N/A 01/21/2017   Procedure: DILATATION & CURETTAGE/HYSTEROSCOPY WITH MYOSURE;  Surgeon: Arvella Nigh, MD;  Location: [place];  Service: Gynecology;  Laterality: N/A;   LIPOMA EXCISION  x2   left shouler area   MELANOMA EXCISION  2005   back area   RIGHT/LEFT HEART CATH AND CORONARY ANGIOGRAPHY N/A 10/27/2016   Procedure: Right/Left Heart Cath and Coronary Angiography;  Surgeon: Adrian Prows, MD;  [facility]: [facility]  INVASIVE CV LAB;  Service: Cardiovascular;  Laterality: N/A;  normal coronary arteries, normal LVSF and LVEDP, moderate to severe 3+ aortic regurg.,  no evidence pulmonry hypertension, normal cardiac output and index   RIGHT/LEFT HEART CATH AND CORONARY ANGIOGRAPHY N/A 12/12/2019   Procedure: RIGHT/LEFT HEART CATH AND CORONARY ANGIOGRAPHY;  Surgeon: Adrian Prows, MD;  Location: [place] [facility];  Service: Cardiovascular;  Laterality: N/A;   THORACIC AORTOGRAM N/A 10/27/2016   Procedure: Thoracic Aortogram;  Surgeon: Adrian Prows, MD;  Location: [place] [facility];  Service: Cardiovascular;  Laterality: N/A;   TUBAL LIGATION Bilateral yrs ago    OB/GYN HISTORY:  OB History  Gravida Para Term Preterm AB Living  3 2     1 2   SAB IAB Ectopic Multiple Live Births               # Outcome Date GA Lbr Len/2nd Weight Sex Delivery Anes PTL Lv  3 AB           2 Para           1 Para             No LMP recorded. Patient is postmenopausal.  Age at menarche: 57 Age at menopause: Approximately 43 Hx  of HRT: Yes, currently taking Hx of STDs: Denies Last pap: 06/2020 - [facility] History of abnormal pap smears: no  SCREENING STUDIES:  Last mammogram: 2022  Last colonoscopy: n/a  Last bone mineral density: 2022  MEDICATIONS: Outpatient Encounter Medications as of 01/08/2022  Medication Sig   ALPRAZolam (XANAX) 0.25 MG tablet Take 0.25 mg by mouth at bedtime as needed for anxiety.    aspirin EC 81 MG tablet Take 81 mg by mouth daily. Swallow whole.    Biotin 5000 MCG TABS Take 1 tablet by mouth daily.    Bismuth Subgallate (DEVROM) 200 MG CAPS 1 tablet as needed   Bismuth Subgallate 200 MG CHEW Chew 1 tablet by mouth 2 (two) times daily.    cyclobenzaprine (FLEXERIL) 5 MG tablet Take 5 mg by mouth daily. One tablet at bedtime.    DHEA 10 MG CAPS Take 1 capsule by mouth every morning.    Diindolylmethane POWD Take 1 capsule by mouth 2 (two) times daily. 100 mg capsule    doxycycline (PERIOSTAT) 20 MG tablet Take 20 mg by mouth 2 (two) times daily.    estradiol (VIVELLE-DOT) 0.025 MG/24HR Place onto the skin.   hydrOXYzine (ATARAX) 50 MG tablet Take 1 tablet by mouth daily.   inFLIXimab (REMICADE) 100 MG injection 19m/kg Intravenous every 8 weeks   levothyroxine (SYNTHROID) 137 MCG tablet Take 1 tablet by mouth daily with breakfast.   loteprednol (LOTEMAX) 0.5 % ophthalmic suspension 1 drop into affected eye Ophthalmic Four times a day for 7 day(s)   MAGNESIUM GLYCINATE PO Take 200 mg by mouth. One tablet at bedtime.    MAGNESIUM MALATE PO Take 141 mg by mouth. One tablet every morning.    meloxicam (MOBIC) 15 MG tablet Take 1 tablet by mouth daily.   Menatetrenone (VITAMIN K2) 100 MCG TABS Take 100 mcg by mouth daily.    Omega-3 Fatty Acids (FISH OIL OMEGA-3) 1000 MG CAPS Take 1 capsule by mouth daily.   progesterone (PROMETRIUM) 100 MG capsule Take 125 mg by mouth at bedtime. COMPOUND MEDICATION IN PILL FORM   sulfaSALAzine (AZULFIDINE) 500 MG tablet Take 1,000 mg by mouth 2 (two) times  daily.    Vitamin D, Ergocalciferol, (DRISDOL) 50000 UNITS  CAPS Take by mouth every 14 (fourteen) days.    [DISCONTINUED] rosuvastatin (CRESTOR) 5 MG tablet Take 5 mg by mouth once a week. One tablet once a week    naratriptan (AMERGE) 2.5 MG tablet Take 2.5 mg by mouth as needed. Take one (1) tablet at onset of headache; if returns or does not resolve, may repeat after 4 hours; do not exceed five (5) mg in 24 hours.  (Patient not taking: Reported on 01/06/2022)   [DISCONTINUED] diltiazem (CARDIZEM CD) 120 MG 24 hr capsule Take 1 capsule (120 mg total) by mouth daily.   [DISCONTINUED] predniSONE 2 MG TBEC Take 2 mg by mouth every other day. TAKES IN AM   [DISCONTINUED] SUMAtriptan (IMITREX) 50 MG tablet Take 50 mg by mouth every 2 (two) hours as needed.    No facility-administered encounter medications on file as of 01/08/2022.    ALLERGIES:  Allergies  Allergen Reactions   Flagyl [Metronidazole] Other (See Comments)    Numbness in legs and arms   Penicillins Hives   Ceclor [Cefaclor] Rash     FAMILY HISTORY:  Family History  Problem Relation Age of Onset   Breast cancer Mother 81   Prostate cancer Brother    Cancer Maternal Grandmother        unsure if ovarian, colon   Breast cancer Maternal Aunt        after 36   Breast cancer Paternal Aunt        after 35     SOCIAL HISTORY:  Social Connections: Not on file    REVIEW OF SYSTEMS:  Denies appetite changes, fevers, chills, fatigue, unexplained weight changes. Denies hearing loss, neck lumps or masses, mouth sores, ringing in ears or voice changes. Denies cough or wheezing.  Denies shortness of breath. Denies chest pain or palpitations. Denies leg swelling. Denies abdominal distention, pain, blood in stools, constipation, diarrhea, nausea, vomiting, or early satiety. Denies pain with intercourse, dysuria, frequency, hematuria or incontinence. Denies hot flashes, vaginal bleeding or vaginal discharge.   Denies joint pain,  back pain or muscle pain/cramps. Denies itching, rash, or wounds. Denies dizziness, headaches, numbness or seizures. Denies swollen lymph nodes or glands, denies easy bruising or bleeding. Denies anxiety, depression, confusion, or decreased concentration.  Physical Exam:  Vital Signs for this encounter:  Blood pressure 104/77, pulse 83, temperature 98 F (36.7 C), temperature source Oral, resp. rate 18, height 5' 6.97" (1.701 m), weight 122 lb 11.2 oz (55.7 kg), SpO2 100 %. Body mass index is 19.24 kg/m. General: Alert, oriented, no acute distress.  HEENT: Normocephalic, atraumatic. Sclera anicteric.  Chest: Clear to auscultation bilaterally. No wheezes, rhonchi, or rales. Cardiovascular: Regular rate and rhythm, no murmurs, rubs, or gallops.  Abdomen: Normoactive bowel sounds. Soft, nondistended, nontender to palpation.  Ostomy site in the left mid abdomen.  No masses or hepatosplenomegaly appreciated. No palpable fluid wave.  There is thinning of the skin along the patient's midline infraumbilical laparotomy incision.  There is an approximately 2-3 cm palpable hernia along this incision. Extremities: Grossly normal range of motion. Warm, well perfused. No edema bilaterally.  Skin: No rashes or lesions.  Lymphatics: No cervical, supraclavicular, or inguinal adenopathy.  GU:  Normal external female genitalia. No lesions. No discharge or bleeding.             Bladder/urethra:  No lesions or masses, well supported bladder             Vagina: Mildly atrophic.  No lesions noted.  Cervix: Normal appearing, no lesions.             Uterus: Small, mobile, no parametrial involvement or nodularity.             Adnexa: Some fullness noted in bilateral adnexa, smooth, no nodularity.  Rectal: Anus is surgically absent.  With Valsalva, no bulge noted.  LABORATORY AND RADIOLOGIC DATA:  Outside medical records were reviewed to synthesize the above history, along with the history and physical  obtained during the visit.   Lab Results  Component Value Date   WBC 9.3 01/09/2020   HGB 14.9 01/09/2020   HCT 45.0 01/09/2020   PLT 472 (H) 01/09/2020   GLUCOSE 98 01/09/2020   ALT 17 12/07/2019   AST 20 12/07/2019   NA 137 01/09/2020   K 4.2 01/09/2020   CL 99 01/09/2020   CREATININE 1.06 (H) 01/09/2020   BUN 28 (H) 01/09/2020   CO2 20 01/09/2020   INR 0.87 12/10/2011

## 2022-01-08 ENCOUNTER — Other Ambulatory Visit: Payer: Self-pay

## 2022-01-08 ENCOUNTER — Encounter: Payer: Self-pay | Admitting: Gynecologic Oncology

## 2022-01-08 ENCOUNTER — Inpatient Hospital Stay: Payer: Medicare Other | Attending: Gynecologic Oncology | Admitting: Gynecologic Oncology

## 2022-01-08 ENCOUNTER — Inpatient Hospital Stay: Payer: Medicare Other

## 2022-01-08 VITALS — BP 104/77 | HR 83 | Temp 98.0°F | Resp 18 | Ht 66.97 in | Wt 122.7 lb

## 2022-01-08 DIAGNOSIS — K668 Other specified disorders of peritoneum: Secondary | ICD-10-CM | POA: Diagnosis not present

## 2022-01-08 DIAGNOSIS — N7011 Chronic salpingitis: Secondary | ICD-10-CM | POA: Diagnosis not present

## 2022-01-08 DIAGNOSIS — N736 Female pelvic peritoneal adhesions (postinfective): Secondary | ICD-10-CM

## 2022-01-08 DIAGNOSIS — Z780 Asymptomatic menopausal state: Secondary | ICD-10-CM | POA: Diagnosis not present

## 2022-01-08 DIAGNOSIS — R1909 Other intra-abdominal and pelvic swelling, mass and lump: Secondary | ICD-10-CM | POA: Diagnosis not present

## 2022-01-08 DIAGNOSIS — K5090 Crohn's disease, unspecified, without complications: Secondary | ICD-10-CM

## 2022-01-08 DIAGNOSIS — G96191 Perineural cyst: Secondary | ICD-10-CM

## 2022-01-08 DIAGNOSIS — R1900 Intra-abdominal and pelvic swelling, mass and lump, unspecified site: Secondary | ICD-10-CM | POA: Insufficient documentation

## 2022-01-08 NOTE — Patient Instructions (Signed)
It was very nice to meet you today.  I will release your Ca-125 test when it comes back, likely tomorrow.  I have placed an order for a aspiration of the left pelvic cyst.  You and I will speak once we get the results from this back.  We can pursue having you see somebody about the Tarlov cyst if your pelvic pressure symptoms do not improve.  We will also tentatively plan on a repeat MRI in 6 months to follow the right adnexal cyst.

## 2022-01-09 ENCOUNTER — Encounter: Payer: Self-pay | Admitting: *Deleted

## 2022-01-09 LAB — CA 125: Cancer Antigen (CA) 125: 23.6 U/mL (ref 0.0–38.1)

## 2022-01-09 NOTE — Progress Notes (Unsigned)
Brittany Peaches, MD  Roosvelt Maser Approved for CT guided aspiration of symptomatic LEFT hydrosalpinx.   HKM        Previous Messages    ----- Message -----  From: Roosvelt Maser  Sent: 01/09/2022   8:16 AM EDT  To: Brittany Peaches, MD  Subject: CT Biopsy                                        Spoke with Dr. Laurence Ferrari    Procedure:  CT Biopsy   Reason:  left adnexal cyst, suspected hydrosalpinx (previously drained0   History: mri in chart   Provider:  Jeral Pinch   Contact:  [phone]

## 2022-01-12 ENCOUNTER — Other Ambulatory Visit: Payer: Self-pay | Admitting: Obstetrics and Gynecology

## 2022-01-12 DIAGNOSIS — Z1231 Encounter for screening mammogram for malignant neoplasm of breast: Secondary | ICD-10-CM

## 2022-01-22 ENCOUNTER — Other Ambulatory Visit: Payer: Self-pay | Admitting: Radiology

## 2022-01-22 DIAGNOSIS — N7011 Chronic salpingitis: Secondary | ICD-10-CM

## 2022-01-23 ENCOUNTER — Other Ambulatory Visit: Payer: Self-pay

## 2022-01-23 ENCOUNTER — Ambulatory Visit (HOSPITAL_COMMUNITY)
Admission: RE | Admit: 2022-01-23 | Discharge: 2022-01-23 | Disposition: A | Discharge status: Home / Self Care | Payer: Medicare Other | Source: Ambulatory Visit | Attending: Gynecologic Oncology | Admitting: Gynecologic Oncology

## 2022-01-23 ENCOUNTER — Other Ambulatory Visit: Payer: Self-pay | Admitting: Gynecologic Oncology

## 2022-01-23 ENCOUNTER — Encounter (HOSPITAL_COMMUNITY): Payer: Self-pay

## 2022-01-23 DIAGNOSIS — L080 Pyoderma: Secondary | ICD-10-CM | POA: Diagnosis not present

## 2022-01-23 DIAGNOSIS — Z953 Presence of xenogenic heart valve: Secondary | ICD-10-CM | POA: Insufficient documentation

## 2022-01-23 DIAGNOSIS — N7011 Chronic salpingitis: Secondary | ICD-10-CM | POA: Insufficient documentation

## 2022-01-23 DIAGNOSIS — Z85820 Personal history of malignant melanoma of skin: Secondary | ICD-10-CM | POA: Insufficient documentation

## 2022-01-23 DIAGNOSIS — K632 Fistula of intestine: Secondary | ICD-10-CM | POA: Diagnosis not present

## 2022-01-23 DIAGNOSIS — K5010 Crohn's disease of large intestine without complications: Secondary | ICD-10-CM | POA: Insufficient documentation

## 2022-01-23 DIAGNOSIS — Z932 Ileostomy status: Secondary | ICD-10-CM | POA: Insufficient documentation

## 2022-01-23 DIAGNOSIS — K56609 Unspecified intestinal obstruction, unspecified as to partial versus complete obstruction: Secondary | ICD-10-CM | POA: Diagnosis not present

## 2022-01-23 DIAGNOSIS — E039 Hypothyroidism, unspecified: Secondary | ICD-10-CM | POA: Diagnosis not present

## 2022-01-23 DIAGNOSIS — G96191 Perineural cyst: Secondary | ICD-10-CM | POA: Insufficient documentation

## 2022-01-23 DIAGNOSIS — K651 Peritoneal abscess: Secondary | ICD-10-CM | POA: Diagnosis not present

## 2022-01-23 DIAGNOSIS — R1900 Intra-abdominal and pelvic swelling, mass and lump, unspecified site: Secondary | ICD-10-CM | POA: Insufficient documentation

## 2022-01-23 LAB — CBC WITH DIFFERENTIAL/PLATELET
Abs Immature Granulocytes: 0.02 10*3/uL (ref 0.00–0.07)
Basophils Absolute: 0.1 10*3/uL (ref 0.0–0.1)
Basophils Relative: 1 %
Eosinophils Absolute: 0.2 10*3/uL (ref 0.0–0.5)
Eosinophils Relative: 4 %
HCT: 38.5 % (ref 36.0–46.0)
Hemoglobin: 12.9 g/dL (ref 12.0–15.0)
Immature Granulocytes: 0 %
Lymphocytes Relative: 34 %
Lymphs Abs: 2.3 10*3/uL (ref 0.7–4.0)
MCH: 30.3 pg (ref 26.0–34.0)
MCHC: 33.5 g/dL (ref 30.0–36.0)
MCV: 90.4 fL (ref 80.0–100.0)
Monocytes Absolute: 0.8 10*3/uL (ref 0.1–1.0)
Monocytes Relative: 12 %
Neutro Abs: 3.2 10*3/uL (ref 1.7–7.7)
Neutrophils Relative %: 49 %
Platelets: 190 10*3/uL (ref 150–400)
RBC: 4.26 MIL/uL (ref 3.87–5.11)
RDW: 14.2 % (ref 11.5–15.5)
WBC: 6.6 10*3/uL (ref 4.0–10.5)
nRBC: 0 % (ref 0.0–0.2)

## 2022-01-23 LAB — BASIC METABOLIC PANEL
Anion gap: 5 (ref 5–15)
BUN: 21 mg/dL (ref 8–23)
CO2: 24 mmol/L (ref 22–32)
Calcium: 8.6 mg/dL — ABNORMAL LOW (ref 8.9–10.3)
Chloride: 110 mmol/L (ref 98–111)
Creatinine, Ser: 0.87 mg/dL (ref 0.44–1.00)
GFR, Estimated: >60 mL/min (ref >60)
Glucose, Bld: 90 mg/dL (ref 70–99)
Potassium: 4.1 mmol/L (ref 3.5–5.1)
Sodium: 139 mmol/L (ref 135–145)

## 2022-01-23 LAB — PROTIME-INR
INR: 1 (ref 0.8–1.2)
Prothrombin Time: 13.2 seconds (ref 11.4–15.2)

## 2022-01-23 MED ORDER — FENTANYL CITRATE (PF) 100 MCG/2ML IJ SOLN
INTRAMUSCULAR | Status: AC
  Filled 2022-01-23: qty 2

## 2022-01-23 MED ORDER — FENTANYL CITRATE (PF) 100 MCG/2ML IJ SOLN
INTRAMUSCULAR | Status: AC | PRN
  Administered 2022-01-23 (×2): 50 ug via INTRAVENOUS

## 2022-01-23 MED ORDER — MIDAZOLAM HCL 2 MG/2ML IJ SOLN
INTRAMUSCULAR | Status: AC
  Filled 2022-01-23: qty 4

## 2022-01-23 MED ORDER — SODIUM CHLORIDE 0.9 % IV SOLN: INTRAVENOUS | Status: DC

## 2022-01-23 MED ORDER — MIDAZOLAM HCL 2 MG/2ML IJ SOLN
INTRAMUSCULAR | Status: AC | PRN
  Administered 2022-01-23 (×3): 1 mg via INTRAVENOUS

## 2022-01-23 MED ORDER — HYDROCODONE-ACETAMINOPHEN 5-325 MG PO TABS: 1.0000 | ORAL_TABLET | ORAL | Status: DC | PRN

## 2022-01-23 NOTE — H&P (Signed)
Referring Physician(s): Tucker,Katherine R  Supervising Physician: Arne Cleveland  Patient Status:  [facility]  Chief Complaint:  Recurrent symptomatic left adnexal cyst  Subjective: Patient known to IR service from left adnexal/cystic mass aspiration in 2011, 2012 and 2013.  She has a history of bilateral adnexal masses as well as Crohn's disease. In the 1990s, she underwent total protocolectomy with ileostomy.  She has had multiple complications and additional surgeries related to this history including enterocutaneous fistula, multiple episodes of small bowel obstruction, ileostomy revision, postoperative development of an intra-abdominal abscess requiring drainage, and peristomal pyoderma. She was previously noted to have a left adnexal mass that was tubular, concerning for hydrosalpinx. She underwent multiple IR drainage procedures for this presumed left hydrosalpinx, last 11/2011 (also in 2012 and 2011). Cytology from FNA of the left cyst in 2011 revealed clusters of columnar cells. No definitive evidence of atypia or malignancy.  She continues to have some pelvic pressure with latest MRI abd on 12/08/2021 revealing:  1. Post prior proctocolectomy with LEFT lower quadrant ileostomy. No signs of bowel obstruction or abscess. 2. No overt signs of inflammation about the pelvis in the area of the proctectomy. 3. 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 Reportedly the patient has a history of bilateral hydrosalpinges which were evaluated recently by ultrasound. The area on the RIGHT which may represent an endometrioma displays a small T2 dark peripheral nodule (image 16/5) 10 x 4 mm. This is indeterminate at this time in the absence of diffusion and subtraction imaging. For this reason the patient is being recalled for repeat assessment with instructions outlined in the [facility] dash board. 4. 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  The area in the RIGHT adnexal region is most suggestive of hematosalpinx with small nodular enhancing focus. The possibility of fallopian tube neoplasm is considered. Findings associated with endometriosis could also potentially have a similar appearance but should be correlated with patient history and symptoms. Sequela of endometriosis would be unusual in a patient of this age in the absence of endometriosis history. Ultimately, Gyn Onc referral is suggested for further management. Would also correlate with any prior history of aspiration in this location or repeated instrumentation. It appears that the contralateral hydrosalpinx has been aspirated on at least 2 occasions.  She presents today for repeat CT-guided aspiration of the left adnexal cyst.  She currently denies fever, headache, chest pain, dyspnea, cough, abdominal pain, nausea, vomiting or bleeding.  Additional medical history as below.  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   Crohn's colitis [facility])    Endometrial polyp    History of malignant melanoma of skin    2005  post excision back area-- localized, in situ   Hypothyroidism    Ileostomy in place [facility])    for crohn's colitis -- external pouch   Migraines    Vitamin D deficiency    Past Surgical History:  Procedure Laterality Date   ABDOMINAL EXPLORATION SURGERY  x6  1988 (first one) --  1994 (last one)   proctocolectomy w/ ileostomy 1988;  revision [place] internal pouch;  temporary external pouch 1993 then [place] N/A 12/12/2019   Procedure: AORTIC ARCH ANGIOGRAPHY;  Surgeon: Adrian Prows, MD;  Location: [place]  CV LAB;  Service: Cardiovascular;  Laterality: N/A;   AORTIC VALVE REPAIR     Aortic valve repair and reduction aortoplasty on 08/07/2017 at [place] W/ TISSUE AORTIC VALVE REPLACEMENT     Aortic Valve Replacement (#23 CE Valve), Hemi arch replacement (Hemishield graft #[id number]) at [facility] clinic on 12/19/2019   BREAST BIOPSY Right 1998   benign   BUNIONECTOMY Right    CARPAL TUNNEL RELEASE Right 09/2016   DILATATION & CURETTAGE/HYSTEROSCOPY WITH MYOSURE N/A 01/21/2017   Procedure: DILATATION & CURETTAGE/HYSTEROSCOPY WITH MYOSURE;  Surgeon: Arvella Nigh, MD;  Location: [place];  Service: Gynecology;  Laterality: N/A;   LIPOMA EXCISION  x2   left shouler area   MELANOMA EXCISION  2005   back area   RIGHT/LEFT HEART CATH AND CORONARY ANGIOGRAPHY N/A 10/27/2016   Procedure: Right/Left Heart Cath and Coronary Angiography;  Surgeon: Adrian Prows, MD;  Location: [place] CV LAB;  Service: Cardiovascular;  Laterality: N/A;  normal coronary arteries, normal LVSF and LVEDP, moderate to severe 3+ aortic regurg.,  no evidence pulmonry hypertension, normal cardiac output and index   RIGHT/LEFT HEART CATH AND CORONARY ANGIOGRAPHY N/A 12/12/2019   Procedure: RIGHT/LEFT HEART CATH AND CORONARY ANGIOGRAPHY;  Surgeon: Adrian Prows, MD;  Location: [place] CV LAB;  Service: Cardiovascular;  Laterality: N/A;   THORACIC AORTOGRAM N/A 10/27/2016   Procedure: Thoracic Aortogram;  Surgeon: Adrian Prows, MD;  Location: [place] CV LAB;  Service: Cardiovascular;  Laterality: N/A;   TUBAL LIGATION Bilateral yrs ago     Allergies: Flagyl [metronidazole], Penicillins, and Ceclor [cefaclor]  Medications: Prior to Admission medications   Medication Sig Start Date End Date Taking? Authorizing Provider  ALPRAZolam Duanne Moron) 0.25 MG tablet Take 0.25 mg by mouth at bedtime as needed for anxiety.     [provider]  aspirin EC 81 MG tablet Take 81 mg by mouth daily. Swallow whole.     [provider]  Biotin 5000 MCG TABS Take 1 tablet by mouth daily.     [provider]  Bismuth Subgallate  (DEVROM) 200 MG CAPS 1 tablet as needed    [provider]  Bismuth Subgallate 200 MG CHEW Chew 1 tablet by mouth 2 (two) times daily.     [provider]  cyclobenzaprine (FLEXERIL) 5 MG tablet Take 5 mg by mouth daily. One tablet at bedtime.     [provider]  DHEA 10 MG CAPS Take 1 capsule by mouth every morning.     [provider]  Diindolylmethane POWD Take 1 capsule by mouth 2 (two) times daily. 100 mg capsule     [provider]  doxycycline (PERIOSTAT) 20 MG tablet Take 20 mg by mouth 2 (two) times daily.     [provider]  estradiol (VIVELLE-DOT) 0.025 MG/24HR Place onto the skin. 10/07/21   [provider]  hydrOXYzine (ATARAX) 50 MG tablet Take 1 tablet by mouth daily. 06/03/21   [provider]  inFLIXimab (REMICADE) 100 MG injection 84m/kg Intravenous every 8 weeks    [provider]  levothyroxine (SYNTHROID) 137 MCG tablet Take 1 tablet by mouth daily with breakfast. 04/30/21   [provider]  loteprednol (LOTEMAX) 0.5 % ophthalmic suspension 1 drop into affected eye Ophthalmic Four times a day for 7 day(s)    [provider]  MAGNESIUM GLYCINATE PO Take 200 mg by mouth. One tablet at bedtime.     [provider]  MAGNESIUM MALATE PO Take 141 mg by mouth. One tablet every morning.     [provider]  meloxicam (MOBIC) 15 MG tablet Take 1 tablet by mouth daily.    [provider]  Menatetrenone (VITAMIN K2) 100 MCG TABS Take 100 mcg by mouth daily.     [provider]  naratriptan (AMERGE) 2.5 MG tablet Take 2.5 mg by mouth as needed. Take one (1) tablet at onset of headache; if returns or does not resolve, may repeat after 4 hours; do not exceed five (5) mg in 24 hours.  Patient not taking: Reported on 01/06/2022    [provider]  Omega-3 Fatty Acids (FISH OIL OMEGA-3) 1000 MG CAPS Take 1 capsule by mouth daily.    [provider]  progesterone (PROMETRIUM) 100 MG capsule Take 125 mg by mouth at bedtime. COMPOUND MEDICATION IN PILL FORM    [provider]  sulfaSALAzine (AZULFIDINE) 500 MG tablet Take 1,000 mg by mouth 2 (two) times daily.     [provider]  Vitamin D, Ergocalciferol, (DRISDOL) 50000 UNITS CAPS Take by mouth every 14 (fourteen) days.     [provider]     Vital Signs: Blood pressure 108/78, heart rate 78, temp 97.9, respirations 16, O2 sat 98% room air    Physical Exam awake, alert.  Chest clear to auscultation bilaterally.  Heart with regular rate and rhythm, positive click; abdomen soft, positive bowel sounds, NT, ostomy in place left mid abdomen.  No lower extremity edema.  Imaging: No results found.  Labs:  CBC: No results for input(s): "WBC", "HGB", "HCT", "PLT" in the last 8760 hours.  COAGS: No results for input(s): "INR", "APTT" in the last 8760 hours.  BMP: No results for input(s): "NA", "K", "CL", "CO2", "GLUCOSE", "BUN", "CALCIUM", "CREATININE", "GFRNONAA", "GFRAA" in the last 8760 hours.  Invalid input(s): "CMP"  LIVER FUNCTION TESTS: No results for input(s): "BILITOT", "AST", "ALT", "ALKPHOS", "PROT", "ALBUMIN" in the last 8760 hours.  Assessment and Plan: Patient known to IR service from left adnexal/cystic mass aspiration in 2011, 2012 and 2013.  She has a history of bilateral adnexal masses as well as Crohn's disease. In the 1990s, she underwent total protocolectomy with ileostomy.  She has had multiple complications and additional surgeries related to this history including enterocutaneous fistula, multiple episodes of small bowel obstruction, ileostomy revision, postoperative development of an intra-abdominal abscess requiring drainage, and peristomal pyoderma. She was previously noted to have a left adnexal mass that was tubular, concerning for hydrosalpinx. She underwent multiple IR drainage procedures for this presumed left hydrosalpinx,  last 11/2011 (also in 2012 and 2011). Cytology from FNA of the left cyst in 2011 revealed clusters of columnar cells. No definitive evidence of atypia or malignancy.  She continues to have some pelvic pressure with latest MRI abd on 12/08/2021 revealing:  1. Post prior proctocolectomy with LEFT lower quadrant ileostomy. No signs of bowel obstruction or abscess. 2. No overt signs of inflammation about the pelvis in the area of the proctectomy. 3. 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 Reportedly the patient has a history of bilateral hydrosalpinges which were evaluated recently by ultrasound. The area on the RIGHT which may represent an endometrioma displays a small T2 dark peripheral nodule (image 16/5) 10 x 4 mm. This is indeterminate at this time in the absence of diffusion and subtraction imaging. For this reason the patient is being recalled for repeat assessment with instructions outlined  in the [facility] dash board. 4. 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  The area in the RIGHT adnexal region is most suggestive of hematosalpinx with small nodular enhancing focus. The possibility of fallopian tube neoplasm is considered. Findings associated with endometriosis could also potentially have a similar appearance but should be correlated with patient history and symptoms. Sequela of endometriosis would be unusual in a patient of this age in the absence of endometriosis history. Ultimately, Gyn Onc referral is suggested for further management. Would also correlate with any prior history of aspiration in this location or repeated instrumentation. It appears that the contralateral hydrosalpinx has been aspirated on at least 2 occasions.  She presents today for repeat CT-guided aspiration of the left adnexal cyst.  Details/risks of procedure, including but  not limited to, internal bleeding, infection, injury to adjacent structures discussed with patient and spouse with their understanding and consent. Latest CA-125 23.6.   Electronically Signed: D. Rowe Robert, PA-C 01/23/2022, 9:55 AM   I spent a total of 20 minutes at the the patient's bedside AND on the patient's hospital floor or unit, greater than 50% of which was counseling/coordinating care for image guided aspiration of left adnexal cyst

## 2022-01-23 NOTE — Sedation Documentation (Signed)
18cc aspirated.

## 2022-01-23 NOTE — Procedures (Signed)
  Procedure:  CT aspiration L pelvic cyst 29m cloudy thin Preprocedure diagnosis: Hydrosalpinx - Plan: CT ABDOMINAL MASS BIOPSY, CT ABDOMINAL MASS BIOPSY, CANCELED: CT FNA BIOPSY 1ST LESION, CANCELED: CT FNA BIOPSY 1ST LESION[p  Postprocedure diagnosis: same EBL:    minimal Complications:   none immediate  See full dictation in [facility]  DDillard CannonMD Main # [phone]  [phone] [phone]

## 2022-01-23 NOTE — Discharge Instructions (Signed)
Please call Interventional Radiology clinic [phone] with any questions or concerns.  You may remove your dressing and shower tomorrow.   Moderate Conscious Sedation, Adult, Care After This sheet gives you information about how to care for yourself after your procedure. Your health care provider may also give you more specific instructions. If you have problems or questions, contact your health careprovider. What can I expect after the procedure? After the procedure, it is common to have: Sleepiness for several hours. Impaired judgment for several hours. Difficulty with balance. Vomiting if you eat too soon. Follow these instructions at home: For the time period you were told by your health care provider: Rest. Do not participate in activities where you could fall or become injured. Do not drive or use machinery. Do not drink alcohol. Do not take sleeping pills or medicines that cause drowsiness. Do not make important decisions or sign legal documents. Do not take care of children on your own. Eating and drinking  Follow the diet recommended by your health care provider. Drink enough fluid to keep your urine pale yellow. If you vomit: Drink water, juice, or soup when you can drink without vomiting. Make sure you have little or no nausea before eating solid foods.  General instructions Take over-the-counter and prescription medicines only as told by your health care provider. Have a responsible adult stay with you for the time you are told. It is important to have someone help care for you until you are awake and alert. Do not smoke. Keep all follow-up visits as told by your health care provider. This is important. Contact a health care provider if: You are still sleepy or having trouble with balance after 24 hours. You feel light-headed. You keep feeling nauseous or you keep vomiting. You develop a rash. You have a fever. You have redness or swelling around the IV  site. Get help right away if: You have trouble breathing. You have new-onset confusion at home. Summary After the procedure, it is common to feel sleepy, have impaired judgment, or feel nauseous if you eat too soon. Rest after you get home. Know the things you should not do after the procedure. Follow the diet recommended by your health care provider and drink enough fluid to keep your urine pale yellow. Get help right away if you have trouble breathing or new-onset confusion at home. This information is not intended to replace advice given to you by your health care provider. Make sure you discuss any questions you have with your healthcare provider. Document Revised: 08/04/2019 Document Reviewed: 03/02/2019 Elsevier Patient Education  2022 [employer].    Needle Biopsy, Care After These instructions tell you how to care for yourself after your procedure. Your doctor may also give you more specific instructions. Call your doctor if you have any problems or questions. What can I expect after the procedure? After the procedure, it is common to have: Soreness. Bruising. Mild pain. Follow these instructions at home:  Return to your normal activities as told by your doctor. Ask your doctor what activities are safe for you. Take over-the-counter and prescription medicines only as told by your doctor. Wash your hands with soap and water before you change your bandage (dressing). If you cannot use soap and water, use hand sanitizer. Follow instructions from your doctor about: How to take care of your puncture site. When and how to change your bandage. When to remove your bandage. Check your puncture site every day for signs of infection. Watch for:  Redness, swelling, or pain. Fluid or blood.  Pus or a bad smell. Warmth. Do not take baths, swim, or use a hot tub until your doctor approves. Ask your doctor if you may take showers. You may only be allowed to take sponge baths. Keep all  follow-up visits as told by your doctor. This is important. Contact a doctor if you have: A fever. Redness, swelling, or pain at the puncture site, and it lasts longer than a few days. Fluid, blood, or pus coming from the puncture site. Warmth coming from the puncture site. Get help right away if: You have a lot of bleeding from the puncture site. Summary After the procedure, it is common to have soreness, bruising, or mild pain at the puncture site. Check your puncture site every day for signs of infection, such as redness, swelling, or pain. Get help right away if you have severe bleeding from your puncture site. This information is not intended to replace advice given to you by your health care provider. Make sure you discuss any questions you have with your health care provider. Document Revised: 04/19/2017 Document Reviewed: 04/19/2017 Elsevier Patient Education  2020 [employer].

## 2022-01-27 LAB — CYTOLOGY - NON PAP

## 2022-02-09 ENCOUNTER — Ambulatory Visit
Admission: RE | Admit: 2022-02-09 | Discharge: 2022-02-09 | Disposition: A | Discharge status: Home / Self Care | Payer: Medicare Other | Source: Ambulatory Visit | Attending: Obstetrics and Gynecology | Admitting: Obstetrics and Gynecology

## 2022-02-09 DIAGNOSIS — Z1231 Encounter for screening mammogram for malignant neoplasm of breast: Secondary | ICD-10-CM

## 2022-03-26 ENCOUNTER — Other Ambulatory Visit: Payer: Self-pay | Admitting: Family Medicine

## 2022-03-26 DIAGNOSIS — M8580 Other specified disorders of bone density and structure, unspecified site: Secondary | ICD-10-CM

## 2022-06-25 ENCOUNTER — Ambulatory Visit: Payer: Medicare Other | Admitting: Cardiology

## 2022-06-30 ENCOUNTER — Ambulatory Visit: Payer: Medicare Other | Admitting: Cardiology

## 2022-06-30 ENCOUNTER — Encounter: Payer: Self-pay | Admitting: Cardiology

## 2022-06-30 VITALS — BP 113/80 | HR 84 | Ht 66.0 in | Wt 124.0 lb

## 2022-06-30 DIAGNOSIS — E782 Mixed hyperlipidemia: Secondary | ICD-10-CM

## 2022-06-30 DIAGNOSIS — Z8679 Personal history of other diseases of the circulatory system: Secondary | ICD-10-CM

## 2022-06-30 DIAGNOSIS — Z952 Presence of prosthetic heart valve: Secondary | ICD-10-CM

## 2022-06-30 DIAGNOSIS — Z2989 Encounter for other specified prophylactic measures: Secondary | ICD-10-CM

## 2022-06-30 MED ORDER — FENOFIBRATE 48 MG PO TABS: 48.0000 mg | ORAL_TABLET | Freq: Every day | ORAL | 2 refills | Status: DC

## 2022-06-30 NOTE — Progress Notes (Signed)
Primary Physician/Referring:  Fanny Bien, MD  Patient ID: Brittany Whitney, female    DOB: 07/14/1955, 67 y.o.   MRN: [id number]  Chief Complaint  Patient presents with   S/P AVR (aortic valve replacement) and aortoplasty   Follow-up   HPI:    Brittany Whitney  is a 67 y.o. female who presents for a follow-up for Valvular heart disease.  She is a retired Marine scientist by profession, had bicuspid aortic valve and severe aortic regurgitation, normal coronary arteries by angiography on 10/27/16, familial hypertriglyceridemia, mild chronic dyspnea, psoriatic arthritis, Crohn's disease S/P colectomy and ileostomy placement, underwent aortic valve repair and reduction aortoplasty on 08/07/2017 at [facility]  Due to worsening aortic regurgitation, she underwent Aortic Valve Replacement (#23 CE Valve), Hemi arch replacement (Hemishield graft #30) at [facility] clinic on 12/19/2019. 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  She presents here for annual visit, presently doing well and remains asymptomatic.   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   Crohn's colitis [facility])    Endometrial polyp    History of malignant melanoma of skin    2005  post excision back area-- localized, in situ   Hypothyroidism    Ileostomy in place [facility])    for crohn's colitis -- external pouch   Migraines    Vitamin D deficiency    Social History   Tobacco Use   Smoking status: Never   Smokeless tobacco: Never  Substance Use Topics   Alcohol use: Yes    Alcohol/week: 7.0 standard drinks of alcohol    Types: 7 Glasses of wine per week    Comment: occasionally   Marital Status: Married  ROS  Review of Systems  Cardiovascular:  Negative for chest pain, dyspnea on exertion and leg swelling.   Objective  Blood pressure 113/80, pulse 84, height '5\' 6"'$  (1.676 m), weight 124 lb (56.2 kg), SpO2 96 %.      06/30/2022    9:07 AM 01/23/2022   12:45 PM 01/23/2022   12:30 PM  Vitals with BMI  Height '5\' 6"'$     Weight 124 lbs    BMI [id number]    Systolic [id number]  Diastolic 80 66 96  Pulse 84 78 70     Physical Exam Neck:     Vascular: No carotid bruit or JVD.  Cardiovascular:     Rate and Rhythm: Normal rate and regular rhythm.     Pulses: Intact distal pulses.     Heart sounds: Murmur heard.     Early systolic murmur is present with a grade of 2/6 at the upper right sternal border and upper left sternal border.     No gallop.  Pulmonary:     Effort: Pulmonary effort is normal.     Breath sounds: Normal breath sounds. No wheezing, rhonchi or rales.  Abdominal:     General: Bowel sounds are normal.     Palpations: Abdomen is soft.     Comments: Ileostomy present  Musculoskeletal:     Right lower leg: No edema.     Left lower leg: No edema.    Laboratory examination:   External labs:   Labs 04/07/2022:  Hb 13.1/HCT 39.9, platelets 235.  Normal indicis.  TSH normal at 1.890.  Total cholesterol 150, triglycerides 218, HDL 46, LDL 68.  Labs 01/12/2022:  Serum  glucose 90 mg, BUN 22, creatinine 0.94, EGFR 67 mL, sodium 145, potassium 4.5, LFTs normal.  Medications and allergies   Allergies  Allergen Reactions   Metronidazole Other (See Comments)    Numbness in legs and arms  Other Reaction(s): numbness in extremities, Unknown   Other Hives and Other (See Comments)   Penicillamine Other (See Comments)   Penicillins Hives   Cefaclor Rash and Other (See Comments)    Current Outpatient Medications:    ALPRAZolam (XANAX) 0.25 MG tablet, Take 0.25 mg by mouth at bedtime as needed for anxiety. , Disp: , Rfl:    aspirin EC 81 MG tablet, Take 81 mg by mouth daily. Swallow whole. , Disp: , Rfl:    Biotin 5000 MCG TABS, Take 1 tablet by mouth daily. , Disp: , Rfl:    Bismuth Subgallate (DEVROM) 200 MG CAPS, 1 tablet as needed, Disp: , Rfl:    Bismuth Subgallate 200 MG CHEW, Chew  1 tablet by mouth 2 (two) times daily. , Disp: , Rfl:    Cholecalciferol (VITAMIN D HIGH POTENCY PO), Take 10,000 Units by mouth daily at 2 PM., Disp: , Rfl:    cyclobenzaprine (FLEXERIL) 5 MG tablet, Take 5 mg by mouth daily. One tablet at bedtime. , Disp: , Rfl:    DHEA 10 MG CAPS, Take 1 capsule by mouth every morning. , Disp: , Rfl:    Diindolylmethane POWD, Take 1 capsule by mouth 2 (two) times daily. 100 mg capsule , Disp: , Rfl:    diltiazem (CARDIZEM CD) 120 MG 24 hr capsule, Take 120 mg by mouth daily., Disp: , Rfl:    doxycycline (PERIOSTAT) 20 MG tablet, Take 20 mg by mouth 2 (two) times daily. , Disp: , Rfl:    estradiol (VIVELLE-DOT) 0.025 MG/24HR, Place onto the skin., Disp: , Rfl:    fenofibrate (TRICOR) 48 MG tablet, Take 1 tablet (48 mg total) by mouth daily., Disp: 30 tablet, Rfl: 2   hydrOXYzine (ATARAX) 50 MG tablet, Take 1 tablet by mouth daily., Disp: , Rfl:    inFLIXimab (REMICADE) 100 MG injection, '5mg'$ /kg Intravenous every 8 weeks, Disp: , Rfl:    levothyroxine (SYNTHROID) 137 MCG tablet, Take 1 tablet by mouth daily with breakfast., Disp: , Rfl:    loteprednol (LOTEMAX) 0.5 % ophthalmic suspension, 1 drop into affected eye Ophthalmic Four times a day for 7 day(s), Disp: , Rfl:    MAGNESIUM GLYCINATE PO, Take 200 mg by mouth. One tablet at bedtime. , Disp: , Rfl:    MAGNESIUM MALATE PO, Take 141 mg by mouth. One tablet every morning. , Disp: , Rfl:    Menatetrenone (VITAMIN K2) 100 MCG TABS, Take 100 mcg by mouth daily. , Disp: , Rfl:    Omega-3 Fatty Acids (FISH OIL OMEGA-3) 1000 MG CAPS, Take 1 capsule by mouth daily., Disp: , Rfl:    progesterone (PROMETRIUM) 100 MG capsule, Take 125 mg by mouth at bedtime. COMPOUND MEDICATION IN PILL FORM, Disp: , Rfl:    sulfaSALAzine (AZULFIDINE) 500 MG tablet, Take 1,000 mg by mouth 2 (two) times daily. , Disp: , Rfl:    Vitamin D, Ergocalciferol, (DRISDOL) 50000 UNITS CAPS, Take by mouth every 14 (fourteen) days. , Disp: , Rfl:      Radiology:   Chest X-Ray 01/01/2020:  1. Postoperative changes with resolving areas of subsegmental atelectasis and trace bilateral pleural effusions, as above. Previously noted small left-sided pneumothorax has resolved. On the lateral view there is a probable small volume  of postoperative fluid posterior to the inferior aspect of the sternum. Attention to this area on follow-up studies is recommended to ensure resolution.  CT Chest/Abdomen/Pelvis 12/19/2019: [facility]  1. The patient has undergone interval AVR using a bioprosthesis, as well  as supracoronary ascending aortic grafting. There are no findings to  suggest anastomotic stenosis, infection, or leak. There are the typical  postoperative changes present. Overall, the findings are appropriate for  the recent postoperative state. Beyond that, normal thoracic aorta. No  acute aortic pathology identified.  2. Normal abdominal aorta. No acute abdominal aortic pathology.  3. Stable appearing changes consistent with left-sided ileostomy.  4. Gallstones noted within the gallbladder.   CTA Chest 03/18/2020: 1. Status post AVR using a bioprosthesis, as well as supracoronary  ascending aortic grafting. There are no findings to suggest anastomotic  stenosis, infection, or leak.  Interval resolution of postoperative  changes.  2. 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   Cardiac Studies:   Right and left heart catheterization 10/27/2016: Normal coronary arteries. Normal LV systolic function. Normal LVEDP. Moderate to severe 3+ aortic regurgitation. Mild aortic root dilatation. No evidence of thoracic aortic aneurysm. No evidence of pulmonary hypertension. Normal card output and index. Recommendation: At this point, unless patient continues to symptoms of dyspnea, we'll continue medical therapy. No evidence of pulmonary hypertension. She probably will eventually need aortic valve replacement. Left ventricle is  not enlarged or dilated and the EDP is normal hence could continue medical therapy for now. 90 mL contrast utilized.  Right and left heart catheterization and aortic root angiogram 12/12/19: Normal coronary arteriogram, codominant system.  Normal LV systolic function, EF [id number].  Severe aortic regurgitation.  Diffuse aortic root and ascending aorta and arch dilatation suggestive of arteriomegaly.  No focal aneurysmal dilatation.  Echocardiogram [facility]) 04/16/2021: - Exam indication: Hx of AVR, Hemi-arch replacement  - The left ventricle is normal in size. Left ventricular systolic function is normal. EF = 61  5% (2D biplane)  - The right ventricle is normal in size. Right ventricular systolic function is low normal.  - Carpentier-Edwards prosthetic aortic valve (size #23). There is no aortic valve regurgitation. The peak gradient is 24 mmHg, the mean gradient is 12 mmHg and the dimensionless valve index is 0.29. Prior peak/mean of 19/10 mmHg.  - Estimated right ventricular systolic pressure is likely underestimated due to a weak or incomplete tricuspid regurgitation signal and is, at least, 23 mmHg consistent with normal pulmonary artery pressures. Estimated right atrial pressure is 8 mmHg based on IVC assessment.  - Exam was compared with the prior echocardiographic exam performed on 03/18/2020. Similar findings.   EKG   EKG 06/30/2022: Normal sinus rhythm at rate of 71 bpm, left axis deviation, left anterior fascicular block.  Incomplete right bundle branch block.  No evidence of ischemia.  Compared to 06/26/2022, no significant change.  Assessment     ICD-10-CM   1. S/P AVR (aortic valve replacement) and aortoplasty  Z95.2 EKG 12-Lead    2. S/P ascending aortic aneurysm repair  Z98.890    Z86.79     3. SBE (subacute bacterial endocarditis) prophylaxis candidate  Z29.89     4. Mixed hyperlipidemia  E78.2 fenofibrate (TRICOR) 48 MG tablet    Lipid Panel With LDL/HDL Ratio       Meds ordered this encounter  Medications   fenofibrate (TRICOR) 48 MG tablet    Sig: Take 1 tablet (48 mg total) by mouth  daily.    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   1. S/P AVR (aortic valve replacement) and aortoplasty Patient is presently doing well, no change in physical exam, no clinical evidence of heart failure.  2. S/P ascending aortic aneurysm repair She needs surveillance CT scan of the chest, patient has an appointment to go to [facility] clinic this summer and she will get echocardiogram and CT scan there.  3. SBE (subacute bacterial endocarditis) prophylaxis candidate Patient is aware that she needs endocarditis prophylaxis.  4. Mixed hyperlipidemia External labs reviewed.  Triglycerides are mildly elevated.  She has no known coronary disease or vascular disease.  However she is already on highest dose of omega-3 acid, I suspect patient being on estrogen supplements may be contributing to elevated triglycerides as well.  I will try Tricor 48 mg daily.  Will check lipids in 4 to 6 weeks.  I will see her back in a year or sooner if problems.     Adrian Prows, MD, [facility] 06/30/2022, 9:41 AM Office: [phone] Pager: [phone]

## 2022-07-01 ENCOUNTER — Encounter: Payer: Self-pay | Admitting: Cardiology

## 2022-07-06 ENCOUNTER — Ambulatory Visit (HOSPITAL_COMMUNITY)
Admission: RE | Admit: 2022-07-06 | Discharge: 2022-07-06 | Disposition: A | Discharge status: Home / Self Care | Payer: Medicare Other | Source: Ambulatory Visit | Attending: Gynecologic Oncology | Admitting: Gynecologic Oncology

## 2022-07-06 DIAGNOSIS — R1900 Intra-abdominal and pelvic swelling, mass and lump, unspecified site: Secondary | ICD-10-CM | POA: Diagnosis present

## 2022-07-06 DIAGNOSIS — N7011 Chronic salpingitis: Secondary | ICD-10-CM | POA: Insufficient documentation

## 2022-07-06 MED ORDER — GADOBUTROL 1 MMOL/ML IV SOLN
5.5000 mL | Freq: Once | INTRAVENOUS | Status: AC | PRN
  Administered 2022-07-06: 5.5 mL via INTRAVENOUS

## 2022-07-08 ENCOUNTER — Telehealth: Payer: Self-pay | Admitting: Gynecologic Oncology

## 2022-07-08 NOTE — Telephone Encounter (Signed)
Called patient to discuss MRI. No answer. Left VM requesting callback.  Jeral Pinch MD Gynecologic Oncology

## 2022-09-30 ENCOUNTER — Other Ambulatory Visit: Payer: Self-pay | Admitting: Cardiology

## 2022-09-30 DIAGNOSIS — E782 Mixed hyperlipidemia: Secondary | ICD-10-CM

## 2022-09-30 LAB — LIPID PANEL WITH LDL/HDL RATIO
Cholesterol, Total: 164 mg/dL (ref 100–199)
HDL: 55 mg/dL (ref >39)
LDL Chol Calc (NIH): 72 mg/dL (ref 0–99)
LDL/HDL Ratio: 1.3 ratio (ref 0.0–3.2)
Triglycerides: 229 mg/dL — ABNORMAL HIGH (ref 0–149)
VLDL Cholesterol Cal: 37 mg/dL (ref 5–40)

## 2022-10-14 ENCOUNTER — Ambulatory Visit
Admission: RE | Admit: 2022-10-14 | Discharge: 2022-10-14 | Disposition: A | Discharge status: Home / Self Care | Payer: Medicare Other | Source: Ambulatory Visit | Attending: Family Medicine | Admitting: Family Medicine

## 2022-10-14 ENCOUNTER — Other Ambulatory Visit: Payer: Self-pay | Admitting: Family Medicine

## 2022-10-14 DIAGNOSIS — Z01818 Encounter for other preprocedural examination: Secondary | ICD-10-CM

## 2022-10-29 ENCOUNTER — Encounter: Payer: Self-pay | Admitting: Cardiology

## 2022-10-30 NOTE — Telephone Encounter (Signed)
From pt

## 2022-11-02 ENCOUNTER — Ambulatory Visit: Payer: Medicare Other | Admitting: Cardiology

## 2022-11-02 DIAGNOSIS — Z01818 Encounter for other preprocedural examination: Secondary | ICD-10-CM

## 2022-11-02 NOTE — Progress Notes (Signed)
EKG 11/02/2022: Sinus rhythm 69 bpm Leftward axis Incomplete right bundle branch block No change compared to previous EKG on 06/30/2022

## 2022-11-11 ENCOUNTER — Encounter: Payer: Self-pay | Admitting: Cardiology

## 2023-01-15 ENCOUNTER — Other Ambulatory Visit: Payer: Self-pay | Admitting: Obstetrics and Gynecology

## 2023-01-15 DIAGNOSIS — Z1231 Encounter for screening mammogram for malignant neoplasm of breast: Secondary | ICD-10-CM

## 2023-02-03 ENCOUNTER — Telehealth: Payer: Self-pay | Admitting: Oncology

## 2023-02-03 NOTE — Telephone Encounter (Signed)
Left a message to check on patient's symptoms.  Requested a return call.

## 2023-02-09 ENCOUNTER — Encounter: Payer: Self-pay | Admitting: Gynecologic Oncology

## 2023-02-09 ENCOUNTER — Other Ambulatory Visit: Payer: Self-pay | Admitting: Gynecologic Oncology

## 2023-02-09 DIAGNOSIS — G96191 Perineural cyst: Secondary | ICD-10-CM

## 2023-02-09 DIAGNOSIS — N7011 Chronic salpingitis: Secondary | ICD-10-CM

## 2023-02-09 NOTE — Telephone Encounter (Signed)
Order placed

## 2023-02-09 NOTE — Telephone Encounter (Signed)
Brittany Whitney called back and said she is doing good.  She had a vaginal vault suspension repair done but is still having pressure.  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 She would like to schedule an MRI if possible.  Advised her that I will notify Dr. Pricilla Holm and will work on getting her scheduled for an MRI.

## 2023-02-10 ENCOUNTER — Telehealth: Payer: Self-pay | Admitting: Oncology

## 2023-02-10 NOTE — Telephone Encounter (Signed)
Thank you :)

## 2023-02-10 NOTE — Telephone Encounter (Signed)
Left a message with MRI appointment on 02/17/23 at [facility].  Arrival at 3:30 and NPO after 12:00.

## 2023-02-11 ENCOUNTER — Ambulatory Visit
Admission: RE | Admit: 2023-02-11 | Discharge: 2023-02-11 | Disposition: A | Discharge status: Home / Self Care | Payer: Medicare Other | Source: Ambulatory Visit | Attending: Obstetrics and Gynecology | Admitting: Obstetrics and Gynecology

## 2023-02-11 DIAGNOSIS — Z1231 Encounter for screening mammogram for malignant neoplasm of breast: Secondary | ICD-10-CM

## 2023-02-12 ENCOUNTER — Ambulatory Visit (HOSPITAL_COMMUNITY)
Admission: RE | Admit: 2023-02-12 | Discharge: 2023-02-12 | Disposition: A | Discharge status: Home / Self Care | Payer: Medicare Other | Source: Ambulatory Visit | Attending: Gynecologic Oncology | Admitting: Gynecologic Oncology

## 2023-02-12 DIAGNOSIS — G96191 Perineural cyst: Secondary | ICD-10-CM | POA: Insufficient documentation

## 2023-02-12 DIAGNOSIS — N7011 Chronic salpingitis: Secondary | ICD-10-CM | POA: Insufficient documentation

## 2023-02-12 MED ORDER — GADOBUTROL 1 MMOL/ML IV SOLN
6.0000 mL | Freq: Once | INTRAVENOUS | Status: AC | PRN
  Administered 2023-02-12: 6 mL via INTRAVENOUS

## 2023-02-17 ENCOUNTER — Ambulatory Visit (HOSPITAL_COMMUNITY): Payer: Medicare Other

## 2023-02-24 ENCOUNTER — Telehealth: Payer: Self-pay | Admitting: Oncology

## 2023-02-24 NOTE — Progress Notes (Signed)
Could you please reach out to this patient again and assure that she sees my message about MRI results? Thank you

## 2023-02-24 NOTE — Telephone Encounter (Signed)
Left a message regarding results per Dr. Pricilla Holm on her MR Pelvis from 02/12/2023.

## 2023-03-24 ENCOUNTER — Telehealth: Payer: Self-pay | Admitting: Oncology

## 2023-03-24 NOTE — Telephone Encounter (Signed)
Please call radiology and ask if they will add an addendum re Tarlov cyst related to any change in size. Thank you

## 2023-03-24 NOTE — Telephone Encounter (Signed)
Requested an addendum on the MRI from 02/12/23 regarding Tarlov Cyst size with Elnita Maxwell at [facility].

## 2023-03-24 NOTE — Telephone Encounter (Signed)
Brittany Whitney called back regarding her MRI results.  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  The MRI report does not have a measurement of the cyst.  She is having heaviness and discomfort in the rectal area and is wondering if the Tarlov cyst is causing her symptoms.

## 2023-03-25 NOTE — Progress Notes (Signed)
Would you mind updating her with Tarlov cyst information? There has been no significant change in the size of the cyst

## 2023-03-25 NOTE — Telephone Encounter (Signed)
Left a message with Tarlov cyst measurement information.  Per Dr. Pricilla Holm, there has been no significant change in the size of the cyst.

## 2023-05-04 LAB — LAB REPORT - SCANNED
EGFR: 49
TSH: 1.12 (ref 0.41–5.90)

## 2023-06-10 ENCOUNTER — Other Ambulatory Visit: Payer: Self-pay | Admitting: Neurological Surgery

## 2023-06-10 DIAGNOSIS — M461 Sacroiliitis, not elsewhere classified: Secondary | ICD-10-CM

## 2023-06-25 ENCOUNTER — Ambulatory Visit
Admission: RE | Admit: 2023-06-25 | Discharge: 2023-06-25 | Disposition: A | Discharge status: Home / Self Care | Payer: Medicare Other | Source: Ambulatory Visit | Attending: Neurological Surgery | Admitting: Neurological Surgery

## 2023-06-25 DIAGNOSIS — M461 Sacroiliitis, not elsewhere classified: Secondary | ICD-10-CM

## 2023-06-28 ENCOUNTER — Encounter: Payer: Self-pay | Admitting: Obstetrics and Gynecology

## 2023-06-30 ENCOUNTER — Ambulatory Visit: Payer: Medicare Other | Attending: Cardiology | Admitting: Cardiology

## 2023-06-30 ENCOUNTER — Encounter: Payer: Self-pay | Admitting: Cardiology

## 2023-06-30 VITALS — BP 118/72 | HR 81 | Resp 16 | Ht 66.0 in | Wt 121.8 lb

## 2023-06-30 DIAGNOSIS — Z9889 Other specified postprocedural states: Secondary | ICD-10-CM | POA: Diagnosis present

## 2023-06-30 DIAGNOSIS — Q2381 Bicuspid aortic valve: Secondary | ICD-10-CM

## 2023-06-30 DIAGNOSIS — I83811 Varicose veins of right lower extremities with pain: Secondary | ICD-10-CM

## 2023-06-30 DIAGNOSIS — Z8679 Personal history of other diseases of the circulatory system: Secondary | ICD-10-CM | POA: Diagnosis present

## 2023-06-30 DIAGNOSIS — E782 Mixed hyperlipidemia: Secondary | ICD-10-CM | POA: Diagnosis present

## 2023-06-30 DIAGNOSIS — Z952 Presence of prosthetic heart valve: Secondary | ICD-10-CM | POA: Diagnosis not present

## 2023-06-30 NOTE — Patient Instructions (Signed)
 Medication Instructions:  Your physician recommends that you continue on your current medications as directed. Please refer to the Current Medication list given to you today.  *If you need a refill on your cardiac medications before your next appointment, please call your pharmacy*  Testing/Procedures: Your physician has requested that you have an echocardiogram in 1 year. Echocardiography is a painless test that uses sound waves to create images of your heart. It provides your doctor with information about the size and shape of your heart and how well your heart's chambers and valves are working. This procedure takes approximately one hour. There are no restrictions for this procedure. Please do NOT wear cologne, perfume, aftershave, or lotions (deodorant is allowed). Please arrive 15 minutes prior to your appointment time.  Please note: We ask at that you not bring children with you during ultrasound (echo/ vascular) testing. Due to room size and safety concerns, children are not allowed in the ultrasound rooms during exams. Our front office staff cannot provide observation of children in our lobby area while testing is being conducted. An adult accompanying a patient to their appointment will only be allowed in the ultrasound room at the discretion of the ultrasound technician under special circumstances. We apologize for any inconvenience.  Follow-Up: At [facility], you and your health needs are our priority.  As part of our continuing mission to provide you with exceptional heart care, we have created designated Provider Care Teams.  These Care Teams include your primary Cardiologist (physician) and Advanced Practice Providers (APPs -  Physician Assistants and Nurse Practitioners) who all work together to provide you with the care you need, when you need it.  Your next appointment:   1 year(s)  The format for your next appointment:   In Person  Provider:   Yates Decamp, MD {  Other  Instructions You have been referred to [facility], their office will call you to schedule an appointment.    1st Floor: - Lobby - Registration  - Pharmacy  - Lab - Cafe  2nd Floor: - PV Lab - Diagnostic Testing (echo, CT, nuclear med)  3rd Floor: - Vacant  4th Floor: - TCTS (cardiothoracic surgery) - AFib Clinic - Structural Heart Clinic - Vascular Surgery  - Vascular Ultrasound  5th Floor: - HeartCare Cardiology (general and EP) - Clinical Pharmacy for coumadin, hypertension, lipid, weight-loss medications, and med management appointments    Valet parking services will be available as well.

## 2023-06-30 NOTE — Progress Notes (Signed)
 Cardiology Office Note:  .   Date:  06/30/2023  ID:  Brittany Whitney, DOB March 09, 1956, MRN [id number] PCP: Brittany Moccasin, MD  [facility] Providers Cardiologist:  Brittany Decamp, MD   History of Present Illness: .   Brittany Whitney is a 68 y.o. female who presents for a follow-up for Valvular heart disease. She is a retired Engineer, civil (consulting) by profession, had bicuspid aortic valve and severe aortic regurgitation, normal coronary arteries by angiography on 10/27/16, familial hypertriglyceridemia, mild chronic dyspnea, psoriatic arthritis, Crohn's disease S/P colectomy and ileostomy placement.  She underwent aortic valve repair and reduction aortoplasty on 08/07/2017 at [facility]. Due to worsening aortic regurgitation, she underwent Aortic Valve Replacement (#23 CE Valve), Hemi arch replacement (Hemishield graft #30) at [facility] clinic on 12/19/2019.   Discussed the use of AI scribe software for clinical note transcription with the patient, who gave verbal consent to proceed.  History of Present Illness   The patient, with a history of multiple heart surgeries, basal squamous, melanoma, and psoriasis, presents for a follow-up visit. She recently returned from a week-long vacation in [place], where she was careful to avoid sun exposure due to her history of skin cancer. She reports no new health concerns since her last visit.  The patient's heart murmur was recently noted by her GYN, who described it as "quite a murmur." The patient is aware of the murmur and is not overly concerned, but sought clarification on what the description meant. She also mentioned a persistent cough, which she attributes to a recent cold or possible COVID-19 infection.  The patient also reports a new development of a large, uncomfortable varicose vein on her leg, which appeared after her last heart surgery. She is unsure if the vein is related to the surgery or to the dissection she underwent. She expresses interest in  treatment for the vein, primarily for cosmetic reasons, but also due to the discomfort it causes.  In addition to these concerns, the patient also discusses her ongoing management of psoriasis. She recently switched from Remicade to Cicagrizi for treatment and reports that her condition is not severe at this time. She experiences psoriasis under her eyelids, which can be itchy and dry, especially when wearing makeup.      Labs   Lab Results  Component Value Date   CHOL 164 09/29/2022   HDL 55 09/29/2022   LDLCALC 72 09/29/2022   TRIG 229 (H) 09/29/2022   External Labs:  [facility] labs 05/04/2023:  Total cholesterol 175, triglycerides 228, HDL 54, LDL 72.  TSH normal at 1.120.  Hb 13.1, platelets 234.  Serum creatinine 1.2, potassium 4.1, creatinine clearance 41.42, EGFR 49 mL.  Review of Systems  Cardiovascular:  Negative for chest pain, dyspnea on exertion and leg swelling.   Physical Exam:   VS:  BP 118/72 (BP Location: Left Arm, Patient Position: Sitting, Cuff Size: Normal)   Pulse 81   Resp 16   Ht 5\' 6"  (1.676 m)   Wt 121 lb 12.8 oz (55.2 kg)   SpO2 98%   BMI 19.66 kg/m    Wt Readings from Last 3 Encounters:  06/30/23 121 lb 12.8 oz (55.2 kg)  06/30/22 124 lb (56.2 kg)  01/23/22 125 lb (56.7 kg)    Physical Exam Neck:     Vascular: No JVD.  Cardiovascular:     Rate and Rhythm: Normal rate and regular rhythm.     Pulses: Intact distal pulses.     Heart sounds:  S1 normal and S2 normal. Murmur heard.     Early systolic murmur is present with a grade of 3/6 at the upper right sternal border and upper left sternal border. Greater saphenous vein prominence on the right with varicosity     No gallop.  Pulmonary:     Effort: Pulmonary effort is normal.     Breath sounds: Normal breath sounds.  Abdominal:     General: Bowel sounds are normal.     Palpations: Abdomen is soft.  Musculoskeletal:     Right lower leg: No edema.     Left lower leg: No edema.    Studies  Reviewed: Marland Kitchen    [facility] echocardiogram on [facility] Everywhere 02/22/2023: - The left ventricle is normal in size. Left ventricular systolic function is normal. EF = 57  5% (2D biplane)  - The right ventricle is normal in size. Right ventricular systolic function is normal.  - There is moderate (2+) tricuspid valve regurgitation.  - Carpentier-Edwards prosthetic aortic valve (size #23). There is trace aortic valve regurgitation. The peak gradient is 27 mmHg, the mean gradient is 10 mmHg and the dimensionless valve index is 0.47. Prior peak/mean gradients of 24/12 mmHg.  - Exam was compared with the prior [facility] echocardiographic exam performed on 04/16/2021. Overall similar findings.   EKG:   EKG 06/30/2022: Normal sinus rhythm at rate of 71 bpm, left axis deviation, left anterior fascicular block. Incomplete right bundle branch block. No evidence of ischemia.   Medications and allergies    Allergies  Allergen Reactions   Metronidazole Other (See Comments)    Numbness in legs and arms  Other Reaction(s): numbness in extremities, Unknown   Other Hives and Other (See Comments)   Penicillamine Other (See Comments)   Penicillins Hives   Cefaclor Rash and Other (See Comments)     Current Outpatient Medications:    ALPRAZolam (XANAX) 0.25 MG tablet, Take 0.25 mg by mouth at bedtime as needed for anxiety. , Disp: , Rfl:    aspirin EC 81 MG tablet, Take 81 mg by mouth daily. Swallow whole. , Disp: , Rfl:    Biotin 5000 MCG TABS, Take 1 tablet by mouth daily. , Disp: , Rfl:    Bismuth Subgallate (DEVROM) 200 MG CAPS, 1 tablet as needed, Disp: , Rfl:    Cholecalciferol (VITAMIN D HIGH POTENCY PO), Take 10,000 Units by mouth daily at 2 PM., Disp: , Rfl:    cyclobenzaprine (FLEXERIL) 5 MG tablet, Take 5 mg by mouth daily. One tablet at bedtime. , Disp: , Rfl:    DHEA 10 MG CAPS, Take 1 capsule by mouth every morning. , Disp: , Rfl:    Diindolylmethane POWD, Take 1 capsule by mouth 2 (two) times  daily. 100 mg capsule , Disp: , Rfl:    diltiazem (CARDIZEM CD) 120 MG 24 hr capsule, Take 120 mg by mouth daily., Disp: , Rfl:    doxycycline (PERIOSTAT) 20 MG tablet, Take 20 mg by mouth 2 (two) times daily. , Disp: , Rfl:    estradiol (VIVELLE-DOT) 0.025 MG/24HR, Place onto the skin., Disp: , Rfl:    fenofibrate (TRICOR) 48 MG tablet, TAKE 1 TABLET BY MOUTH DAILY, Disp: 90 tablet, Rfl: 2   hydrOXYzine (ATARAX) 50 MG tablet, Take 1 tablet by mouth daily., Disp: , Rfl:    levothyroxine (SYNTHROID) 137 MCG tablet, Take 1 tablet by mouth daily with breakfast., Disp: , Rfl:    MAGNESIUM GLYCINATE PO, Take 200 mg by mouth.  One tablet at bedtime. , Disp: , Rfl:    MAGNESIUM MALATE PO, Take 141 mg by mouth. One tablet every morning. , Disp: , Rfl:    Menatetrenone (VITAMIN K2) 100 MCG TABS, Take 100 mcg by mouth daily. , Disp: , Rfl:    Omega-3 Fatty Acids (FISH OIL OMEGA-3) 1000 MG CAPS, Take 1 capsule by mouth daily., Disp: , Rfl:    progesterone (PROMETRIUM) 100 MG capsule, Take 125 mg by mouth at bedtime. COMPOUND MEDICATION IN PILL FORM, Disp: , Rfl:    rosuvastatin (CRESTOR) 10 MG tablet, Take 10 mg by mouth daily., Disp: , Rfl:    SKYRIZI PEN 150 MG/ML pen, , Disp: , Rfl:    sulfaSALAzine (AZULFIDINE) 500 MG tablet, Take 1,000 mg by mouth 2 (two) times daily. , Disp: , Rfl:    Testosterone 20 % CREA, by Does not apply route., Disp: , Rfl:    Vitamin D, Ergocalciferol, (DRISDOL) 50000 UNITS CAPS, Take by mouth every 14 (fourteen) days. , Disp: , Rfl:    ASSESSMENT AND PLAN: .      ICD-10-CM   1. S/P AVR (aortic valve replacement) and aortoplasty (#23 CE Valve 12/18/2019)  Z95.2 ECHOCARDIOGRAM COMPLETE    2. Bicuspid aortic valve  Q23.81 ECHOCARDIOGRAM COMPLETE    3. S/P ascending aortic aneurysm repair  Z98.890 ECHOCARDIOGRAM COMPLETE   Z86.79     4. Mixed hyperlipidemia  E78.2     5. Varicose veins of right lower extremity with pain  I83.811 Ambulatory referral to Vascular Surgery       Assessment and Plan    Heart murmur due to prosthetic valve   The heart murmur results from blood flow through a prosthetic valve, a known outcome of valve replacement surgery. It remains consistent with previous examinations, showing no change in valve function since December 2022. The valve functions well without degeneration, and her active lifestyle may enhance valve longevity. Prosthetic valves, particularly bioprosthetic, last longer in patients over 65. At 75, if replacement becomes necessary, a smaller valve may suffice due to expected reduced physical activity. Schedule an echocardiogram in one year to monitor valve function and follow up in two years unless symptoms change.  Varicose veins   She reports varicose veins post-heart surgery, causing cosmetic concern and occasional aching. She seeks treatment mainly for cosmetic reasons, with some discomfort noted. Refer to vascular surgeon for evaluation and potential treatment.  Bicuspid aortic valve Patient's aortic valve pathology was related to bicuspid aortic valve and ascending aortic aneurysm, she is SP aortic valve replacement (#23 CE Valve) in 2021.  I will repeat echocardiogram in a year from now, alternately she does go to [facility] clinic every other year.  Mixed hypercholesterolemia Patient has no known coronary artery disease or premature coronary disease in the family, she has normal coronary arteries, her lipids LDL is at goal as of prior labs.  Triglycerides continue to remain elevated and this is familial.  I also suspect her GI issues including ulcerative colitis is also playing a role in this.  No changes in the medications were done today.  Follow-up   She will be seen in one year for an echocardiogram and in two years for a follow-up appointment unless symptoms change. Schedule the follow-up appointment in two years and the echocardiogram in one year.           Signed,  Brittany Decamp, MD, [facility] 06/30/2023, 9:50  AM [facility] [address] [place], [place] [address] Phone:  [phone]. Fax:  [phone]

## 2023-07-02 ENCOUNTER — Other Ambulatory Visit: Payer: Self-pay | Admitting: *Deleted

## 2023-07-02 DIAGNOSIS — I8393 Asymptomatic varicose veins of bilateral lower extremities: Secondary | ICD-10-CM

## 2023-07-05 ENCOUNTER — Ambulatory Visit (INDEPENDENT_AMBULATORY_CARE_PROVIDER_SITE_OTHER): Admitting: Physician Assistant

## 2023-07-05 ENCOUNTER — Ambulatory Visit (HOSPITAL_COMMUNITY)
Admission: RE | Admit: 2023-07-05 | Discharge: 2023-07-05 | Disposition: A | Discharge status: Home / Self Care | Source: Ambulatory Visit | Attending: Surgery | Admitting: Surgery

## 2023-07-05 VITALS — BP 112/76 | HR 71 | Temp 97.1°F | Ht 66.0 in | Wt 122.9 lb

## 2023-07-05 DIAGNOSIS — I8393 Asymptomatic varicose veins of bilateral lower extremities: Secondary | ICD-10-CM | POA: Insufficient documentation

## 2023-07-05 DIAGNOSIS — I872 Venous insufficiency (chronic) (peripheral): Secondary | ICD-10-CM | POA: Diagnosis not present

## 2023-07-05 DIAGNOSIS — I83811 Varicose veins of right lower extremities with pain: Secondary | ICD-10-CM

## 2023-07-05 DIAGNOSIS — M7989 Other specified soft tissue disorders: Secondary | ICD-10-CM

## 2023-07-05 NOTE — Progress Notes (Signed)
 Requested by:  Yates Decamp, MD [address] [place],  [place] [address]  Reason for consultation: varicose vein of RLE with pain     History of Present Illness   Brittany Whitney is a 68 y.o. (Apr 01, 1956) female who presents for evaluation of large varicose vein of RLE with pain and swelling. She explains that she feels the varicose vein really developed after complication from TAVR in 2021 where they had to cutdown on her right groin. She has been having a lot of aching discomfort along length of vein in right thigh. She also gets tenderness along vein and swelling in her right leg at times.  She denies any symptoms in LLE. She does elevate her legs frequently but does not wear compression stockings. She is overall very active and works out regularly. 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 She denies any history of DVT or any family history.   Venous symptoms include: aching, throbbing, swelling Onset/duration: 2021  Occupation:  retired Char[employer] fundraiser Aggravating factors: sitting, standing Alleviating factors: elevation Compression:  no Helps:  unsure Pain medications:  no Previous vein procedures:  History of DVT:  no  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   Crohn's colitis (HCC)    Endometrial polyp    History of malignant melanoma of skin    2005  post excision back area-- localized, in situ   Hypothyroidism    Ileostomy in place [facility])    for crohn's colitis -- external pouch   Migraines    Vitamin D deficiency     Past Surgical History:  Procedure Laterality Date   ABDOMINAL EXPLORATION SURGERY  x6  1988 (first one) --  1994 (last one)   proctocolectomy w/ ileostomy 1988;  revision 1989, 1991, 1992 internal pouch;  temporary external pouch 1993 then permanant 1994   AORTIC ARCH ANGIOGRAPHY N/A 12/12/2019   Procedure: AORTIC ARCH ANGIOGRAPHY;  Surgeon: Yates Decamp, MD;   Location: [facility] INVASIVE CV LAB;  Service: Cardiovascular;  Laterality: N/A;   AORTIC VALVE REPAIR     Aortic valve repair and reduction aortoplasty on 08/07/2017 at [facility]   ASCENDING AORTIC ANEURYSM REPAIR W/ TISSUE AORTIC VALVE REPLACEMENT     Aortic Valve Replacement (#23 CE Valve), Hemi arch replacement (Hemishield graft #30) at [facility] clinic on 12/19/2019   BREAST BIOPSY Right 1998   benign   BUNIONECTOMY Right    CARPAL TUNNEL RELEASE Right 09/2016   DILATATION & CURETTAGE/HYSTEROSCOPY WITH MYOSURE N/A 01/21/2017   Procedure: DILATATION & CURETTAGE/HYSTEROSCOPY WITH MYOSURE;  Surgeon: Richardean Chimera, MD;  Location: [facility] [place];  Service: Gynecology;  Laterality: N/A;   LIPOMA EXCISION  x2   left shouler area   MELANOMA EXCISION  2005   back area   RIGHT/LEFT HEART CATH AND CORONARY ANGIOGRAPHY N/A 10/27/2016   Procedure: Right/Left Heart Cath and Coronary Angiography;  Surgeon: Yates Decamp, MD;  Location: [facility] INVASIVE CV LAB;  Service: Cardiovascular;  Laterality: N/A;  normal coronary arteries, normal LVSF and LVEDP, moderate to severe 3+ aortic regurg.,  no evidence pulmonry hypertension, normal cardiac output and index   RIGHT/LEFT HEART CATH AND CORONARY ANGIOGRAPHY N/A 12/12/2019   Procedure: RIGHT/LEFT HEART CATH AND CORONARY ANGIOGRAPHY;  Surgeon: Yates Decamp, MD;  Location: [facility] INVASIVE CV LAB;  Service: Cardiovascular;  Laterality: N/A;   THORACIC AORTOGRAM  N/A 10/27/2016   Procedure: Thoracic Aortogram;  Surgeon: Yates Decamp, MD;  Location: [facility] INVASIVE CV LAB;  Service: Cardiovascular;  Laterality: N/A;   TUBAL LIGATION Bilateral yrs ago    Social History   Socioeconomic History   Marital status: Married    Spouse name: Not on file   Number of children: 2   Years of education: Not on file   Highest education level: Not on file  Occupational History   Not on file  Tobacco Use   Smoking status: Never   Smokeless tobacco: Never  Vaping Use   Vaping  status: Never Used  Substance and Sexual Activity   Alcohol use: Yes    Alcohol/week: 7.0 standard drinks of alcohol    Types: 7 Glasses of wine per week    Comment: occasionally   Drug use: Never   Sexual activity: Yes    Birth control/protection: None  Other Topics Concern   Not on file  Social History Narrative   Not on file   Social Drivers of Health   Financial Resource Strain: Not on file  Food Insecurity: Not on file  Transportation Needs: Not on file  Physical Activity: Not on file  Stress: Not on file  Social Connections: Unknown (09/02/2021)   [facility]   Social Network    Social Network: Not on file  Intimate Partner Violence: Unknown (07/25/2021)   Received from [facility]   HITS    Physically Hurt: Not on file    Insult or Talk Down To: Not on file    Threaten Physical Harm: Not on file    Scream or Curse: Not on file    Family History  Problem Relation Age of Onset   Breast cancer Mother 53   Prostate cancer Brother    Cancer Maternal Grandmother        unsure if ovarian, colon   Breast cancer Maternal Aunt        after 31   Breast cancer Paternal Aunt        after 56    Current Outpatient Medications  Medication Sig Dispense Refill   ALPRAZolam (XANAX) 0.25 MG tablet Take 0.25 mg by mouth at bedtime as needed for anxiety.      aspirin EC 81 MG tablet Take 81 mg by mouth daily. Swallow whole.      Biotin 5000 MCG TABS Take 1 tablet by mouth daily.      Bismuth Subgallate (DEVROM) 200 MG CAPS 1 tablet as needed     Cholecalciferol (VITAMIN D HIGH POTENCY PO) Take 10,000 Units by mouth daily at 2 PM.     cyclobenzaprine (FLEXERIL) 5 MG tablet Take 5 mg by mouth daily. One tablet at bedtime.      DHEA 10 MG CAPS Take 1 capsule by mouth every morning.      Diindolylmethane POWD Take 1 capsule by mouth 2 (two) times daily. 100 mg capsule      diltiazem (CARDIZEM CD) 120 MG 24 hr capsule Take 120 mg by  mouth daily.     doxycycline (PERIOSTAT) 20 MG tablet Take 20 mg by mouth 2 (two) times daily.      estradiol (VIVELLE-DOT) 0.025 MG/24HR Place onto the skin.     fenofibrate (TRICOR) 48 MG tablet TAKE 1 TABLET BY MOUTH DAILY 90 tablet 2   hydrOXYzine (ATARAX) 50 MG tablet Take 1 tablet by mouth daily.     levothyroxine (SYNTHROID) 137 MCG tablet  Take 1 tablet by mouth daily with breakfast.     MAGNESIUM GLYCINATE PO Take 200 mg by mouth. One tablet at bedtime.      MAGNESIUM MALATE PO Take 141 mg by mouth. One tablet every morning.      Menatetrenone (VITAMIN K2) 100 MCG TABS Take 100 mcg by mouth daily.      Omega-3 Fatty Acids (FISH OIL OMEGA-3) 1000 MG CAPS Take 1 capsule by mouth daily.     progesterone (PROMETRIUM) 100 MG capsule Take 125 mg by mouth at bedtime. COMPOUND MEDICATION IN PILL FORM     rosuvastatin (CRESTOR) 10 MG tablet Take 10 mg by mouth daily.     SKYRIZI PEN 150 MG/ML pen      sulfaSALAzine (AZULFIDINE) 500 MG tablet Take 1,000 mg by mouth 2 (two) times daily.      Testosterone 20 % CREA by Does not apply route.     Vitamin D, Ergocalciferol, (DRISDOL) 50000 UNITS CAPS Take by mouth every 14 (fourteen) days.      No current facility-administered medications for this visit.    Allergies  Allergen Reactions   Metronidazole Other (See Comments)    Numbness in legs and arms  Other Reaction(s): numbness in extremities, Unknown   Other Hives and Other (See Comments)   Penicillamine Other (See Comments)   Penicillins Hives   Cefaclor Rash and Other (See Comments)    REVIEW OF SYSTEMS (negative unless checked):   Cardiac:  []  Chest pain or chest pressure? []  Shortness of breath upon activity? []  Shortness of breath when lying flat? []  Irregular heart rhythm?  Vascular:  []  Pain in calf, thigh, or hip brought on by walking? []  Pain in feet at night that wakes you up from your sleep? []  Blood clot in your veins? [x]  Leg swelling?  Pulmonary:  []  Oxygen at  home? []  Productive cough? []  Wheezing?  Neurologic:  []  Sudden weakness in arms or legs? []  Sudden numbness in arms or legs? []  Sudden onset of difficult speaking or slurred speech? []  Temporary loss of vision in one eye? []  Problems with dizziness?  Gastrointestinal:  []  Blood in stool? []  Vomited blood?  Genitourinary:  []  Burning when urinating? []  Blood in urine?  Psychiatric:  []  Major depression  Hematologic:  []  Bleeding problems? []  Problems with blood clotting?  Dermatologic:  []  Rashes or ulcers?  Constitutional:  []  Fever or chills?  Ear/Nose/Throat:  []  Change in hearing? []  Nose bleeds? []  Sore throat?  Musculoskeletal:  []  Back pain? []  Joint pain? []  Muscle pain?   Physical Examination     Vitals:   07/05/23 1430  BP: 112/76  Pulse: 71  Temp: (!) 97.1 F (36.2 C)  SpO2: 97%  Weight: 122 lb 14.4 oz (55.7 kg)  Height: 5\' 6"  (1.676 m)   Body mass index is 19.84 kg/m.  General:  WDWN in NAD; vital signs documented above Gait: Normal HENT: WNL, normocephalic Pulmonary: normal non-labored breathing without wheezing Cardiac: regular HR Abdomen: soft Vascular Exam/Pulses: 2+ DP and PT pulses bilaterally Extremities: with large varicose vein that traverses from Right medial thigh to right ankle, without reticular veins, without edema, without stasis pigmentation, without lipodermatosclerosis, without ulcers Musculoskeletal: no muscle wasting or atrophy  Neurologic: A&O X 3;  No focal weakness or paresthesias are detected Psychiatric:  The pt has Normal affect.  Non-invasive Vascular Imaging   BLE Venous Insufficiency Duplex (07/05/23):  RLE:  No DVT and SVT,  GSV reflux SFJ to  mid calf GSV diameter 0.53-.8 cm No SSV reflux CFV, FV deep venous reflux   Medical Decision Making   SEILA LISTON is a 68 y.o. female who presents with: RLE chronic venous insufficiency with large large varicose vein, swelling and pain. This has been on  going for a couple years now.  Based on the patient's history and examination, I recommend: daily elevation of 20-30 minutes above level of heart, daily compression stocking use, exercise, weight reduction, refraining from prolonged sitting or standing. I discussed with the patient the use of her 20-30 mm thigh high compression stockings and need for 3 month trial of such. She was measured and fitted for a pair at today's visit Patient was provided with information about vein health The patient will follow up in 3-4 months with Dr. Randie Heinz or Dr. Metro Kung, PA-C [facility] Office: [phone]  07/05/2023, 2:36 PM  Clinic MD: Myra Gianotti

## 2023-11-01 ENCOUNTER — Encounter: Payer: Self-pay | Admitting: Surgery

## 2023-11-01 ENCOUNTER — Telehealth: Payer: Self-pay | Admitting: Cardiology

## 2023-11-01 ENCOUNTER — Ambulatory Visit: Attending: Surgery | Admitting: Surgery

## 2023-11-01 VITALS — BP 111/76 | HR 77 | Temp 97.9°F | Ht 66.0 in | Wt 124.0 lb

## 2023-11-01 DIAGNOSIS — I83891 Varicose veins of right lower extremities with other complications: Secondary | ICD-10-CM | POA: Insufficient documentation

## 2023-11-01 NOTE — Progress Notes (Signed)
 [facility] [place]  Patient name: Brittany Whitney MRN: [id number] DOB: 1955/04/26 Sex: female   REASON FOR VISIT:    Follow up  HISOTRY OF PRESENT ILLNESS:    Brittany Whitney is a 68 y.o. female who returns today for follow-up of her varicose veins.  She was seen 3 months ago in [place] clinic for evaluation of a large varicosity on her right leg that was associated with pain and swelling.  She feels these developed after complications from a valve repair at the [facility] clinic in 2021 which required a cutdown.  She has a lot of aching along the length of the vein.  She denies any symptoms in the left leg.  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  Her dad started [employer] dermatology.  There is no history of DVT.  She has been using her 20-30 thigh-high compression stockings.   PAST MEDICAL HISTORY:   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   Crohn's colitis (HCC)    Endometrial polyp    History of malignant melanoma of skin    2005  post excision back area-- localized, in situ   Hypothyroidism    Ileostomy in place [facility])    for crohn's colitis -- external pouch   Migraines    Vitamin D deficiency      FAMILY HISTORY:   Family History  Problem Relation Age of Onset   Breast cancer Mother 39   Prostate cancer Brother    Cancer Maternal Grandmother        unsure if ovarian, colon   Breast cancer Maternal Aunt        after 50   Breast cancer Paternal Aunt        after 70    SOCIAL HISTORY:   Social History   Tobacco Use   Smoking status: Never   Smokeless tobacco: Never  Substance Use Topics   Alcohol use: Yes    Alcohol/week: 7.0 standard drinks of alcohol    Types: 7 Glasses of wine per week    Comment: occasionally     ALLERGIES:   Allergies  Allergen Reactions   Metronidazole Other (See Comments)    Numbness in legs and  arms  Other Reaction(s): numbness in extremities, Unknown   Other Hives and Other (See Comments)   Penicillamine Other (See Comments)   Penicillins Hives   Cefaclor Rash and Other (See Comments)     CURRENT MEDICATIONS:   Current Outpatient Medications  Medication Sig Dispense Refill   ALPRAZolam (XANAX) 0.25 MG tablet Take 0.25 mg by mouth at bedtime as needed for anxiety.      aspirin  EC 81 MG tablet Take 81 mg by mouth daily. Swallow whole.      Biotin 5000 MCG TABS Take 1 tablet by mouth daily.      Bismuth Subgallate (DEVROM) 200 MG CAPS 1 tablet as needed     Cholecalciferol (VITAMIN D HIGH POTENCY PO) Take 10,000 Units by mouth daily at 2 PM.     cyclobenzaprine (FLEXERIL) 5 MG tablet Take 5 mg by mouth daily. One tablet at bedtime.      DHEA 10 MG CAPS Take 1 capsule by mouth every morning.      Diindolylmethane POWD Take 1 capsule by mouth 2 (two) times daily. 100 mg capsule      diltiazem  (CARDIZEM   CD) 120 MG 24 hr capsule Take 120 mg by mouth daily.     doxycycline (PERIOSTAT) 20 MG tablet Take 20 mg by mouth 2 (two) times daily.      estradiol (VIVELLE-DOT) 0.025 MG/24HR Place onto the skin.     fenofibrate  (TRICOR ) 48 MG tablet TAKE 1 TABLET BY MOUTH DAILY 90 tablet 2   hydrOXYzine (ATARAX) 50 MG tablet Take 1 tablet by mouth daily.     levothyroxine (SYNTHROID) 137 MCG tablet Take 1 tablet by mouth daily with breakfast.     MAGNESIUM GLYCINATE PO Take 200 mg by mouth. One tablet at bedtime.      MAGNESIUM MALATE PO Take 141 mg by mouth. One tablet every morning.      Menatetrenone (VITAMIN K2) 100 MCG TABS Take 100 mcg by mouth daily.      Omega-3 Fatty Acids (FISH OIL OMEGA-3) 1000 MG CAPS Take 1 capsule by mouth daily.     progesterone (PROMETRIUM) 100 MG capsule Take 125 mg by mouth at bedtime. COMPOUND MEDICATION IN PILL FORM     rosuvastatin (CRESTOR) 10 MG tablet Take 10 mg by mouth daily.     SKYRIZI PEN 150 MG/ML pen      sulfaSALAzine (AZULFIDINE) 500 MG  tablet Take 1,000 mg by mouth 2 (two) times daily.      Testosterone 20 % CREA by Does not apply route.     Vitamin D, Ergocalciferol, (DRISDOL) 50000 UNITS CAPS Take by mouth every 14 (fourteen) days.      No current facility-administered medications for this visit.    REVIEW OF SYSTEMS:   [X]  denotes positive finding, [ ]  denotes negative finding Cardiac  Comments:  Chest pain or chest pressure:    Shortness of breath upon exertion:    Short of breath when lying flat:    Irregular heart rhythm:        Vascular    Pain in calf, thigh, or hip brought on by ambulation:    Pain in feet at night that wakes you up from your sleep:     Blood clot in your veins:    Leg swelling:  x       Pulmonary    Oxygen at home:    Productive cough:     Wheezing:         Neurologic    Sudden weakness in arms or legs:     Sudden numbness in arms or legs:     Sudden onset of difficulty speaking or slurred speech:    Temporary loss of vision in one eye:     Problems with dizziness:         Gastrointestinal    Blood in stool:     Vomited blood:         Genitourinary    Burning when urinating:     Blood in urine:        Psychiatric    Major depression:         Hematologic    Bleeding problems:    Problems with blood clotting too easily:        Skin    Rashes or ulcers:        Constitutional    Fever or chills:      PHYSICAL EXAM:   There were no vitals filed for this visit.  GENERAL: The patient is a well-nourished female, in no acute distress. The vital signs are documented above. CARDIAC: There is a regular rate and  rhythm.  VASCULAR: SonoSite was used to evaluate the right great saphenous vein.  It is very superficial and significantly dilated. PULMONARY: Non-labored respirations ABDOMEN: Soft and non-tender with normal pitched bowel sounds.  MUSCULOSKELETAL: There are no major deformities or cyanosis. NEUROLOGIC: No focal weakness or paresthesias are detected. SKIN:  There are no ulcers or rashes noted. PSYCHIATRIC: The patient has a normal affect.  STUDIES:   I have reviewed the following reflux study: Venous Reflux Times  +--------------+---------+------+-----------+------------+--------+  RIGHT        Reflux NoRefluxReflux TimeDiameter cmsComments                          Yes                                   +--------------+---------+------+-----------+------------+--------+  CFV                    yes   >1 second                       +--------------+---------+------+-----------+------------+--------+  FV prox                 yes   >1 second                       +--------------+---------+------+-----------+------------+--------+  FV mid        no                                              +--------------+---------+------+-----------+------------+--------+  FV dist       no                                              +--------------+---------+------+-----------+------------+--------+  Popliteal    no                                              +--------------+---------+------+-----------+------------+--------+  GSV at SFJ              yes    >500 ms      0.69              +--------------+---------+------+-----------+------------+--------+  GSV prox thigh          yes    >500 ms     0.539              +--------------+---------+------+-----------+------------+--------+  GSV mid thigh           yes    >500 ms     0.539              +--------------+---------+------+-----------+------------+--------+  GSV dist thigh          yes    >500 ms     0.575              +--------------+---------+------+-----------+------------+--------+  GSV at knee             yes    >500 ms  0.666              +--------------+---------+------+-----------+------------+--------+  GSV prox calf           yes    >500 ms     0.812               +--------------+---------+------+-----------+------------+--------+  GSV mid calf            yes    >500 ms     0.612              +--------------+---------+------+-----------+------------+--------+  SSV Pop Fossa no                           0.256              +--------------+---------+------+-----------+------------+--------+  SSV prox calf no                           0.124              +--------------+---------+------+-----------+------------+--------+  SSV mid calf  no                           0.062              +--------------+---------+------+-----------+------------+--------+    MEDICAL ISSUES:   CEAP class III, right leg: She has tried nonoperative measures including elevation, compression, and exercise and still has symptoms.  Compression stockings will help but as soon as she takes them off her symptoms return.  I discussed proceeding with endovenous laser ablation of the right saphenous vein with greater than 20 stabs.  Her saphenous vein is fairly superficial and so the majority of this may be stabs to remove the saphenous vein    Malvina New, IV, MD, FACS [facility] [phone] Pager [phone]

## 2023-11-01 NOTE — Telephone Encounter (Signed)
 I spoke with patient.  She reports feeling extremely fatigued recently.  BP has been OK but she feels fatigued like it is running low.  Has been having short runs of PVC's that last a few seconds.  Improves with Valsalva.  Usually occur when lying in bed.  Has been having periods of dizziness.  Not related to episodes of PVC's. Dizziness occurs even when just sitting but is worse when standing up. Has not passed out. Orthostatic precautions reviewed.  Patient is staying hydrated.  Is on levothyroxine and TSH and recent lab work was all OK.  No chest pain. No shortness of breath but she has not been exerting herself much recently due to fatigue.  She is concerned about her valve as she had similar symptoms prior to surgery. Will forward to Dr Ladona for review/recommendations

## 2023-11-01 NOTE — Telephone Encounter (Signed)
 STAT if patient feels like he/she is going to faint   1. Are you feeling dizzy, lightheaded, or faint right now? Patient stated a little bit dizzy right now   2. Have you passed out?  no (If yes move to .[facility])   3. Do you have any other symptoms? Patient states no other symptoms. She states over the past week or so she has had a couple of episodes of PVC's    4. Have you checked your HR and BP (record if available)? BP 117/76 HR 78

## 2023-11-01 NOTE — Telephone Encounter (Signed)
 She has had an echo in Dec at [facility] and it looks good. She has sleep apnea and disturbance in sleep can lead to fatigue and PVCs. Just a thought if she needs to revisit sleep. Happy to see her also please schedule routine non urgent visit with me please.

## 2023-11-03 NOTE — Telephone Encounter (Signed)
 Patient notified.  She reports she has not been diagnosed with sleep apnea.  Appointment made for patient to see Dr Ladona on August 1 at 10:20

## 2023-11-19 ENCOUNTER — Encounter: Payer: Self-pay | Admitting: Cardiology

## 2023-11-19 ENCOUNTER — Ambulatory Visit: Attending: Cardiology | Admitting: Cardiology

## 2023-11-19 VITALS — BP 116/73 | HR 77 | Resp 16 | Ht 66.0 in | Wt 124.2 lb

## 2023-11-19 DIAGNOSIS — Q2381 Bicuspid aortic valve: Secondary | ICD-10-CM | POA: Diagnosis present

## 2023-11-19 DIAGNOSIS — I951 Orthostatic hypotension: Secondary | ICD-10-CM | POA: Diagnosis present

## 2023-11-19 DIAGNOSIS — Z8679 Personal history of other diseases of the circulatory system: Secondary | ICD-10-CM | POA: Insufficient documentation

## 2023-11-19 DIAGNOSIS — Z2989 Encounter for other specified prophylactic measures: Secondary | ICD-10-CM | POA: Diagnosis not present

## 2023-11-19 DIAGNOSIS — Z9889 Other specified postprocedural states: Secondary | ICD-10-CM | POA: Diagnosis not present

## 2023-11-19 DIAGNOSIS — Z952 Presence of prosthetic heart valve: Secondary | ICD-10-CM | POA: Insufficient documentation

## 2023-11-19 NOTE — Progress Notes (Signed)
 Cardiology Office Note:  .   Date:  11/19/2023  ID:  [name], DOB 1955-08-19, MRN [id number] PCP: Waylan Almarie SAUNDERS, MD  [facility] Providers Cardiologist:  Gordy Bergamo, MD   History of Present Illness: .   Brittany Whitney is a 68 y.o. female who presents for a follow-up for Valvular heart disease. She is a retired Engineer, civil (consulting) by profession, had bicuspid aortic valve and severe aortic regurgitation, normal coronary arteries by angiography on 10/27/16, familial hypertriglyceridemia, mild chronic dyspnea, psoriatic arthritis, Crohn's disease S/P colectomy and ileostomy placement.  Her main complaint today is marked generalized weakness and severe lightheadedness and dizziness.   She underwent aortic valve repair and reduction aortoplasty on 08/07/2017 at [facility]. Due to worsening aortic regurgitation, she underwent Aortic Valve Replacement (#23 CE Valve), Hemi arch replacement (Hemishield graft #30) at [facility] clinic on 12/19/2019.   Discussed the use of AI scribe software for clinical note transcription with the patient, who gave verbal consent to proceed.  History of Present Illness Brittany Whitney is a 68 year old female with aortic valve replacement who presents with lightheadedness and fatigue.  She experiences lightheadedness when sitting up and standing, with relief when lying flat. This is not associated with dizziness. Her fatigue is similar to what she experienced prior to her valve replacement. She underwent aortic valve and root replacement in 2021 due to bicuspid aortic valve and severe aortic regurgitation. She is on endocarditis prophylaxis and takes antibiotics before dental procedures. There is no history of sleep apnea.  Labs   Lab Results  Component Value Date   CHOL 164 09/29/2022   HDL 55 09/29/2022   LDLCALC 72 09/29/2022   TRIG 229 (H) 09/29/2022   No results found for: HGBA1C  Lab Results  Component Value Date   TSH 1.12 05/04/2023    External  Labs:  [facility] labs 11/10/2018:  Total cholesterol 142, triglycerides 138, HDL 57, LDL 63.  A1c 5.4%.  Hb 13.5, platelets 242.  Serum creatinine 0.960, potassium 4.5, magnesium 1.8, EGFR 49 mL.  ROS  Review of Systems  Cardiovascular:  Negative for chest pain, dyspnea on exertion and leg swelling.  Neurological:  Positive for light-headedness and weakness.   Physical Exam:   VS:  BP 116/73 (BP Location: Left Arm, Patient Position: Sitting, Cuff Size: Normal)   Pulse 77   Resp 16   Ht 5' 6 (1.676 m)   Wt 124 lb 3.2 oz (56.3 kg)   SpO2 99%   BMI 20.05 kg/m    Wt Readings from Last 3 Encounters:  11/19/23 124 lb 3.2 oz (56.3 kg)  11/01/23 124 lb (56.2 kg)  07/05/23 122 lb 14.4 oz (55.7 kg)    Orthostatic VS for the past 24 hrs (Last 3 readings):  BP- Lying Pulse- Lying BP- Sitting Pulse- Sitting BP- Standing at 0 minutes Pulse- Standing at 0 minutes BP- Standing at 3 minutes Pulse- Standing at 3 minutes  11/19/23 1103 107/70 78 104/66 78 102/77 77 (!) 73/36 82   Physical Exam Neck:     Vascular: No JVD.  Cardiovascular:     Rate and Rhythm: Normal rate and regular rhythm.     Pulses: Intact distal pulses.     Heart sounds: S1 normal and S2 normal. Murmur heard.     Early systolic murmur is present with a grade of 3/6 at the upper right sternal border radiating to the neck.     No gallop.  Pulmonary:  Effort: Pulmonary effort is normal.     Breath sounds: Normal breath sounds.  Abdominal:     General: Bowel sounds are normal.     Palpations: Abdomen is soft.  Musculoskeletal:     Right lower leg: No edema.     Left lower leg: No edema.    Studies Reviewed: SABRA    [facility] echocardiogram on [facility] Everywhere 02/22/2023: - The left ventricle is normal in size. Left ventricular systolic function is normal. EF = 57  5% (2D biplane)  - The right ventricle is normal in size. Right ventricular systolic function is normal.  - There is moderate (2+) tricuspid valve  regurgitation.  - Carpentier-Edwards prosthetic aortic valve (size #23). There is trace aortic valve regurgitation. The peak gradient is 27 mmHg, the mean gradient is 10 mmHg and the dimensionless valve index is 0.47. Prior peak/mean gradients of 24/12 mmHg.  - Exam was compared with the prior CC echocardiographic exam performed on 04/16/2021. Overall similar findings.   EKG:    EKG Interpretation Date/Time:  Friday November 19 2023 10:20:26 EDT Ventricular Rate:  82 PR Interval:  148 QRS Duration:  82 QT Interval:  348 QTC Calculation: 406 R Axis:   -59  Text Interpretation: EKG 11/19/2023: Normal sinus rhythm at rate of 82 bpm, left anterior fascicular block.  Incomplete right bundle branch block.  T wave abnormality, cannot exclude anterolateral ischemia.  Compared to 06/30/2022, T wave inversion in V4?"6 is new, no change in T wave version anterior leads. Confirmed by Roger Kettles, Jagadeesh [phone]) on 11/19/2023 10:40:01 AM  EKG 06/30/2022: Normal sinus rhythm at rate of 71 bpm, left axis deviation, left anterior fascicular block. Incomplete right bundle branch block.   Medications ordered    No orders of the defined types were placed in this encounter.    ASSESSMENT AND PLAN: .      ICD-10-CM   1. Bicuspid aortic valve  Q23.81 EKG 12-Lead    2. S/P AVR (aortic valve replacement) and aortoplasty (#23 CE Valve 12/18/2019)  Z95.2     3. S/P ascending aortic aneurysm repair  Z98.890    Z86.79     4. SBE (subacute bacterial endocarditis) prophylaxis candidate  Z29.89     5. Orthostatic hypotension  I95.1      Assessment & Plan Orthostatic hypotension Orthostatic hypotension with significant drop in blood pressure upon standing, leading to lightheadedness. Blood pressure readings: sitting 104/66 mmHg, supine 107/70 mmHg, standing initially 102/77 mmHg, dropping to 73/36 mmHg after three minutes. Symptoms may be related to diltiazem  use, pain from Tarlov cyst, or idiopathic causes.  Differential includes autonomic neuropathy, adrenal insufficiency, and idiopathic orthostatic hypotension. Medical decision making involves stopping diltiazem  to assess its impact on symptoms, considering idiopathic nature, and potential need for further workup if symptoms persist. - Stop diltiazem  and monitor symptoms for 2 weeks. - Encourage increased salt and fluid intake. - Recommend wearing support stockings. - If symptoms persist, refer to Dr. Almarie Scala for workup including B1, B6, B12 levels, and evaluation for adrenal insufficiency if symptoms do not resolve over the next 4 to 6 weeks. - Consider midodrine 5-10 mg three times a day while awake if symptoms persist before travel, patient is planning to travel to [place] sometime in October 2025. - Reassess symptoms in 4-6 weeks.  Status post aortic valve replacement with aortic root replacement and endocarditis prophylaxis Status post bicuspid aortic valve replacement and aortic root replacement in 2021. No evidence of ischemia on EKG despite  T wave abnormalities. Endocarditis prophylaxis required for dental procedures. - Continue endocarditis prophylaxis for dental procedures. - Follow-up on scheduled echocardiogram in March 2025, I will see her back in a year or sooner if problems.  This is a 60-minute office visit encounter and evaluation of her symptoms, nursing time, review of her external labs.  Signed,  Gordy Bergamo, MD, [facility] 11/19/2023, 12:27 PM [facility] [address] [place], [place] [address] Phone: [phone]. Fax:  [phone]

## 2023-11-19 NOTE — Patient Instructions (Signed)
 Medication Instructions:  Your physician has recommended you make the following change in your medication: Stop Cardizem   *If you need a refill on your cardiac medications before your next appointment, please call your pharmacy*  Lab Work: none If you have labs (blood work) drawn today and your tests are completely normal, you will receive your results only by: MyChart Message (if you have MyChart) OR A paper copy in the mail If you have any lab test that is abnormal or we need to change your treatment, we will call you to review the results.  Testing/Procedures: none  Follow-Up: At [facility], you and your health needs are our priority.  As part of our continuing mission to provide you with exceptional heart care, our providers are all part of one team.  This team includes your primary Cardiologist (physician) and Advanced Practice Providers or APPs (Physician Assistants and Nurse Practitioners) who all work together to provide you with the care you need, when you need it.  Your next appointment:   12 month(s)  Provider:   Gordy Bergamo, MD    We recommend signing up for the patient portal called MyChart.  Sign up information is provided on this After Visit Summary.  MyChart is used to connect with patients for Virtual Visits (Telemedicine).  Patients are able to view lab/test results, encounter notes, upcoming appointments, etc.  Non-urgent messages can be sent to your provider as well.   To learn more about what you can do with MyChart, go to [email or web address].   Other Instructions

## 2023-11-30 ENCOUNTER — Encounter: Payer: Self-pay | Admitting: *Deleted

## 2023-12-16 ENCOUNTER — Ambulatory Visit (INDEPENDENT_AMBULATORY_CARE_PROVIDER_SITE_OTHER): Admitting: Sports Medicine

## 2023-12-16 ENCOUNTER — Ambulatory Visit
Admission: RE | Admit: 2023-12-16 | Discharge: 2023-12-16 | Disposition: A | Discharge status: Home / Self Care | Source: Ambulatory Visit | Attending: Sports Medicine | Admitting: Sports Medicine

## 2023-12-16 VITALS — BP 116/72 | Ht 66.0 in | Wt 124.0 lb

## 2023-12-16 DIAGNOSIS — M5010 Cervical disc disorder with radiculopathy, unspecified cervical region: Secondary | ICD-10-CM

## 2023-12-16 DIAGNOSIS — M79601 Pain in right arm: Secondary | ICD-10-CM | POA: Diagnosis present

## 2023-12-16 NOTE — Assessment & Plan Note (Addendum)
 She is getting improvement with  Restarting remicaid Flexeril at night - she can increase to 10 mg Meloxicam  once daily  We will recheck Cervical XR Continue above meds Easy exercises for spasm and motion  If not resolving in 1 month we should reck  12/30/23 Patient has had significant improvement in her symptoms on current treatment.  Now able to resume normal motions. XRay show severe degenerative change and some fusion of mid cervical spine.  However with her sxs less we should continue conservative care and check if worsenting.  KBF  (Incidentally discussed options to evaluate sacral Tarlov Cyst)

## 2023-12-16 NOTE — Progress Notes (Signed)
 CC: pain radiating down RT arm  Patient had onset of pain radiating into her RT upper arm and trapezius area This came on 2 weeks ago without a specific injury ? 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 Palpation is normal with no tenderness over AC joint or bicipital groove. ROM is full in all planes. Rotator cuff strength normal throughout. No signs of impingement with negative Neer and Hawkin's tests, empty can. Speeds and Yergason's tests normal. No labral pathology noted with negative Obrien's, negative clunk and good stability. Normal scapular function observed. No painful arc and no drop arm sign. No apprehension sign  RT trapezius spasm Normal scapular motion Cervical spine motion is normal but does trigger some tingling into her arm

## 2024-02-28 ENCOUNTER — Other Ambulatory Visit: Payer: Self-pay | Admitting: Obstetrics and Gynecology

## 2024-02-28 DIAGNOSIS — Z1231 Encounter for screening mammogram for malignant neoplasm of breast: Secondary | ICD-10-CM

## 2024-03-02 ENCOUNTER — Telehealth: Payer: Self-pay

## 2024-03-03 ENCOUNTER — Ambulatory Visit
Admission: RE | Admit: 2024-03-03 | Discharge: 2024-03-03 | Disposition: A | Discharge status: Home / Self Care | Source: Ambulatory Visit | Attending: Obstetrics and Gynecology | Admitting: Obstetrics and Gynecology

## 2024-03-03 DIAGNOSIS — Z1231 Encounter for screening mammogram for malignant neoplasm of breast: Secondary | ICD-10-CM

## 2024-03-08 ENCOUNTER — Other Ambulatory Visit: Payer: Self-pay | Admitting: Obstetrics and Gynecology

## 2024-03-08 DIAGNOSIS — R928 Other abnormal and inconclusive findings on diagnostic imaging of breast: Secondary | ICD-10-CM

## 2024-03-10 ENCOUNTER — Other Ambulatory Visit: Payer: Self-pay | Admitting: Gastroenterology

## 2024-03-10 DIAGNOSIS — R634 Abnormal weight loss: Secondary | ICD-10-CM

## 2024-03-10 DIAGNOSIS — R110 Nausea: Secondary | ICD-10-CM

## 2024-03-10 DIAGNOSIS — K50018 Crohn's disease of small intestine with other complication: Secondary | ICD-10-CM

## 2024-03-10 DIAGNOSIS — K6289 Other specified diseases of anus and rectum: Secondary | ICD-10-CM

## 2024-03-13 ENCOUNTER — Inpatient Hospital Stay: Admission: RE | Admit: 2024-03-13 | Source: Ambulatory Visit

## 2024-03-14 ENCOUNTER — Other Ambulatory Visit

## 2024-03-22 NOTE — Telephone Encounter (Signed)
 Addressed.

## 2024-03-23 ENCOUNTER — Ambulatory Visit
Admission: RE | Admit: 2024-03-23 | Discharge: 2024-03-23 | Disposition: A | Source: Ambulatory Visit | Attending: Gastroenterology | Admitting: Gastroenterology

## 2024-03-23 DIAGNOSIS — K50018 Crohn's disease of small intestine with other complication: Secondary | ICD-10-CM

## 2024-03-23 DIAGNOSIS — R110 Nausea: Secondary | ICD-10-CM

## 2024-03-23 DIAGNOSIS — R634 Abnormal weight loss: Secondary | ICD-10-CM

## 2024-03-23 DIAGNOSIS — K6289 Other specified diseases of anus and rectum: Secondary | ICD-10-CM

## 2024-03-23 MED ORDER — IOPAMIDOL (ISOVUE-300) INJECTION 61%
80.0000 mL | Freq: Once | INTRAVENOUS | Status: AC | PRN
  Administered 2024-03-23: 80 mL via INTRAVENOUS

## 2024-03-24 ENCOUNTER — Ambulatory Visit
Admission: RE | Admit: 2024-03-24 | Discharge: 2024-03-24 | Disposition: A | Discharge status: Home / Self Care | Source: Ambulatory Visit | Attending: Obstetrics and Gynecology | Admitting: Obstetrics and Gynecology

## 2024-03-24 DIAGNOSIS — R928 Other abnormal and inconclusive findings on diagnostic imaging of breast: Secondary | ICD-10-CM

## 2024-04-03 ENCOUNTER — Other Ambulatory Visit

## 2024-05-07 ENCOUNTER — Encounter: Payer: Self-pay | Admitting: Internal Medicine

## 2024-05-07 DIAGNOSIS — Z8774 Personal history of (corrected) congenital malformations of heart and circulatory system: Secondary | ICD-10-CM | POA: Insufficient documentation

## 2024-05-07 DIAGNOSIS — L4059 Other psoriatic arthropathy: Secondary | ICD-10-CM | POA: Insufficient documentation

## 2024-05-07 DIAGNOSIS — E782 Mixed hyperlipidemia: Secondary | ICD-10-CM | POA: Insufficient documentation

## 2024-05-07 DIAGNOSIS — E041 Nontoxic single thyroid nodule: Secondary | ICD-10-CM | POA: Insufficient documentation

## 2024-05-07 DIAGNOSIS — Z2989 Encounter for other specified prophylactic measures: Secondary | ICD-10-CM | POA: Insufficient documentation

## 2024-05-07 DIAGNOSIS — E559 Vitamin D deficiency, unspecified: Secondary | ICD-10-CM | POA: Insufficient documentation

## 2024-05-07 NOTE — Progress Notes (Unsigned)
 "     Subjective:    Patient ID: Brittany Whitney, female    DOB: 01/01/1956, 69 y.o.   MRN: [id number]     HPI Brittany Whitney is here for follow up of her chronic medical problems.  She is here to establish with a new pcp.    Bicuspic valve s/p AVR, aortoplasty S/p asc aortic aneurygm     Medications and allergies reviewed with patient and updated if appropriate.  Medications Ordered Prior to Encounter[1]   Review of Systems     Objective:  There were no vitals filed for this visit. BP Readings from Last 3 Encounters:  12/16/23 116/72  11/19/23 116/73  11/01/23 111/76   Wt Readings from Last 3 Encounters:  12/16/23 124 lb (56.2 kg)  11/19/23 124 lb 3.2 oz (56.3 kg)  11/01/23 124 lb (56.2 kg)   There is no height or weight on file to calculate BMI.    Physical Exam     Lab Results  Component Value Date   WBC 6.6 01/23/2022   HGB 12.9 01/23/2022   HCT 38.5 01/23/2022   PLT 190 01/23/2022   GLUCOSE 90 01/23/2022   CHOL 164 09/29/2022   TRIG 229 (H) 09/29/2022   HDL 55 09/29/2022   LDLCALC 72 09/29/2022   ALT 17 12/07/2019   AST 20 12/07/2019   NA 139 01/23/2022   K 4.1 01/23/2022   CL 110 01/23/2022   CREATININE 0.87 01/23/2022   BUN 21 01/23/2022   CO2 24 01/23/2022   TSH 1.12 05/04/2023   INR 1.0 01/23/2022     Assessment & Plan:    See Problem List for Assessment and Plan of chronic medical problems.       [1]  Current Outpatient Medications on File Prior to Visit  Medication Sig Dispense Refill   ALPRAZolam (XANAX) 0.25 MG tablet Take 0.25 mg by mouth at bedtime as needed for anxiety.      aspirin  EC 81 MG tablet Take 81 mg by mouth daily. Swallow whole.      Biotin 5000 MCG TABS Take 1 tablet by mouth daily.      Bismuth Subgallate (DEVROM) 200 MG CAPS 1 tablet as needed     Cholecalciferol (VITAMIN D HIGH POTENCY PO) Take 10,000 Units by mouth daily at 2 PM.     cyclobenzaprine (FLEXERIL) 5 MG tablet Take 5 mg by mouth daily. One tablet at  bedtime.      DHEA 10 MG CAPS Take 1 capsule by mouth every morning.      Diindolylmethane POWD Take 1 capsule by mouth 2 (two) times daily. 100 mg capsule      doxycycline (PERIOSTAT) 20 MG tablet Take 20 mg by mouth 2 (two) times daily.      estradiol (VIVELLE-DOT) 0.025 MG/24HR Place onto the skin.     fenofibrate  (TRICOR ) 48 MG tablet TAKE 1 TABLET BY MOUTH DAILY 90 tablet 2   hydrOXYzine  (ATARAX ) 50 MG tablet Take 1 tablet by mouth daily.     levothyroxine (SYNTHROID) 137 MCG tablet Take 1 tablet by mouth daily with breakfast.     MAGNESIUM GLYCINATE PO Take 200 mg by mouth. One tablet at bedtime.      MAGNESIUM MALATE PO Take 141 mg by mouth. One tablet every morning.      Menatetrenone (VITAMIN K2) 100 MCG TABS Take 100 mcg by mouth daily.      Omega-3 Fatty Acids (FISH OIL OMEGA-3) 1000 MG CAPS Take 1 capsule by  mouth daily.     progesterone (PROMETRIUM) 100 MG capsule Take 125 mg by mouth at bedtime. COMPOUND MEDICATION IN PILL FORM     rosuvastatin (CRESTOR) 10 MG tablet Take 10 mg by mouth daily.     SKYRIZI PEN 150 MG/ML pen      sulfaSALAzine (AZULFIDINE) 500 MG tablet Take 1,000 mg by mouth 2 (two) times daily.      Testosterone 20 % CREA by Does not apply route.     Vitamin D, Ergocalciferol, (DRISDOL) 50000 UNITS CAPS Take by mouth every 14 (fourteen) days.      No current facility-administered medications on file prior to visit.   "

## 2024-05-10 ENCOUNTER — Ambulatory Visit: Admitting: Internal Medicine

## 2024-05-10 VITALS — BP 114/80 | HR 70 | Temp 98.1°F | Ht 66.0 in | Wt 122.0 lb

## 2024-05-10 DIAGNOSIS — G43109 Migraine with aura, not intractable, without status migrainosus: Secondary | ICD-10-CM | POA: Insufficient documentation

## 2024-05-10 DIAGNOSIS — E781 Pure hyperglyceridemia: Secondary | ICD-10-CM | POA: Diagnosis not present

## 2024-05-10 DIAGNOSIS — Z79890 Hormone replacement therapy (postmenopausal): Secondary | ICD-10-CM | POA: Diagnosis not present

## 2024-05-10 DIAGNOSIS — I351 Nonrheumatic aortic (valve) insufficiency: Secondary | ICD-10-CM

## 2024-05-10 DIAGNOSIS — Z8774 Personal history of (corrected) congenital malformations of heart and circulatory system: Secondary | ICD-10-CM | POA: Diagnosis not present

## 2024-05-10 DIAGNOSIS — L4059 Other psoriatic arthropathy: Secondary | ICD-10-CM

## 2024-05-10 DIAGNOSIS — G96191 Perineural cyst: Secondary | ICD-10-CM | POA: Diagnosis not present

## 2024-05-10 DIAGNOSIS — K5090 Crohn's disease, unspecified, without complications: Secondary | ICD-10-CM | POA: Diagnosis not present

## 2024-05-10 DIAGNOSIS — G479 Sleep disorder, unspecified: Secondary | ICD-10-CM | POA: Diagnosis not present

## 2024-05-10 DIAGNOSIS — E039 Hypothyroidism, unspecified: Secondary | ICD-10-CM | POA: Insufficient documentation

## 2024-05-10 DIAGNOSIS — Z932 Ileostomy status: Secondary | ICD-10-CM | POA: Diagnosis not present

## 2024-05-10 DIAGNOSIS — E782 Mixed hyperlipidemia: Secondary | ICD-10-CM | POA: Diagnosis not present

## 2024-05-10 MED ORDER — HYDROXYZINE HCL 50 MG PO TABS: 50.0000 mg | ORAL_TABLET | Freq: Every evening | ORAL | 5 refills | Status: AC | PRN

## 2024-05-10 NOTE — Assessment & Plan Note (Signed)
 Chronic Euthyroid Continue levothyroxine 137 mcg daily

## 2024-05-10 NOTE — Assessment & Plan Note (Signed)
 Occasional ocular migraines-no headache involved

## 2024-05-10 NOTE — Assessment & Plan Note (Signed)
 History of bicuspid aortic valve s/p AVR Following with cardiology

## 2024-05-10 NOTE — Patient Instructions (Addendum)
" ° ° ° °  Medications changes include :   None      Return in about 6 months (around 11/07/2024) for follow up.  "

## 2024-05-10 NOTE — Assessment & Plan Note (Signed)
 Chronic Difficulty falling asleep Currently taking buspirone nightly-not sure if this works or not Also taking hydroxyzine  50 mg at night as needed, which I refilled today

## 2024-05-10 NOTE — Assessment & Plan Note (Signed)
 Chronic Management per Dr. McComb On estradiol patch, Prometrium, DHEA

## 2024-05-10 NOTE — Assessment & Plan Note (Signed)
 Chronic Ileostomy in left lower quadrant-secondary to Crohn's disease

## 2024-05-10 NOTE — Assessment & Plan Note (Addendum)
 Chronic Controlled Following with GI-Dr. Rosalie S/p colectomy end ileostomy placement On sulfasalazine 1000 mg twice daily

## 2024-05-10 NOTE — Assessment & Plan Note (Addendum)
 Chronic Following with rheumatology-Dr. Mai Controlled Currently on Simponi

## 2024-05-10 NOTE — Assessment & Plan Note (Signed)
 Chronic Cyst in sacrum This is causing chronic pain of in her buttock, perineum and down her right leg She has intermittent fasciculation in the right leg Pain can be severe at times She is thinking about having this surgery, but would have to be at one of the specialists 1 in [place]  and 1 in [place]  May need updated sacral MRI before being able to see the specialist

## 2024-05-10 NOTE — Assessment & Plan Note (Addendum)
 Chronic Managed by cardiology Lab Results  Component Value Date   LDLCALC 72 09/29/2022    Stressed regular exercise Continue Crestor 10 mg twice weekly, fenofibrate  48 mg daily

## 2024-05-10 NOTE — Assessment & Plan Note (Signed)
 Chronic Will have repeat blood work done soon to reevaluate Did take ubiquinol for a while and then it did work, but she had side effects and had to discontinue it
